# Patient Record
Sex: Male | Born: 1955 | Hispanic: No | Marital: Married | Smoking: Never smoker
Health system: Southern US, Community
[De-identification: ages and names within clinical notes are randomized; demographics above are authoritative.]

## PROBLEM LIST (undated history)

## (undated) DIAGNOSIS — J45909 Unspecified asthma, uncomplicated: Secondary | ICD-10-CM

## (undated) DIAGNOSIS — Z860100 Personal history of colon polyps, unspecified: Secondary | ICD-10-CM

## (undated) DIAGNOSIS — R7303 Prediabetes: Secondary | ICD-10-CM

## (undated) DIAGNOSIS — I10 Essential (primary) hypertension: Secondary | ICD-10-CM

## (undated) DIAGNOSIS — M2550 Pain in unspecified joint: Secondary | ICD-10-CM

## (undated) DIAGNOSIS — J189 Pneumonia, unspecified organism: Secondary | ICD-10-CM

## (undated) DIAGNOSIS — K56609 Unspecified intestinal obstruction, unspecified as to partial versus complete obstruction: Secondary | ICD-10-CM

## (undated) DIAGNOSIS — M1990 Unspecified osteoarthritis, unspecified site: Secondary | ICD-10-CM

## (undated) DIAGNOSIS — I719 Aortic aneurysm of unspecified site, without rupture: Secondary | ICD-10-CM

## (undated) HISTORY — PX: POLYPECTOMY: SHX149

## (undated) HISTORY — PX: COLONOSCOPY: SHX174

## (undated) HISTORY — DX: Essential (primary) hypertension: I10

---

## 2002-06-06 ENCOUNTER — Emergency Department (HOSPITAL_COMMUNITY): Admission: EM | Admit: 2002-06-06 | Discharge: 2002-06-06 | Payer: Self-pay | Admitting: Emergency Medicine

## 2005-06-15 ENCOUNTER — Ambulatory Visit: Payer: Self-pay | Admitting: Gastroenterology

## 2005-06-16 ENCOUNTER — Ambulatory Visit: Payer: Self-pay | Admitting: Gastroenterology

## 2005-06-23 ENCOUNTER — Ambulatory Visit: Payer: Self-pay | Admitting: Gastroenterology

## 2010-10-23 HISTORY — PX: ANKLE SURGERY: SHX546

## 2011-10-24 HISTORY — PX: NOSE SURGERY: SHX723

## 2015-04-28 ENCOUNTER — Ambulatory Visit: Payer: Self-pay | Admitting: Internal Medicine

## 2015-05-07 ENCOUNTER — Ambulatory Visit: Payer: Self-pay | Admitting: Internal Medicine

## 2015-06-30 ENCOUNTER — Ambulatory Visit (INDEPENDENT_AMBULATORY_CARE_PROVIDER_SITE_OTHER): Payer: Commercial Managed Care - HMO | Admitting: Internal Medicine

## 2015-06-30 ENCOUNTER — Encounter: Payer: Self-pay | Admitting: Internal Medicine

## 2015-06-30 ENCOUNTER — Other Ambulatory Visit (INDEPENDENT_AMBULATORY_CARE_PROVIDER_SITE_OTHER): Payer: Commercial Managed Care - HMO

## 2015-06-30 ENCOUNTER — Ambulatory Visit (INDEPENDENT_AMBULATORY_CARE_PROVIDER_SITE_OTHER)
Admission: RE | Admit: 2015-06-30 | Discharge: 2015-06-30 | Disposition: A | Discharge status: Home / Self Care | Payer: Commercial Managed Care - HMO | Source: Ambulatory Visit | Attending: Internal Medicine | Admitting: Internal Medicine

## 2015-06-30 ENCOUNTER — Encounter (INDEPENDENT_AMBULATORY_CARE_PROVIDER_SITE_OTHER): Payer: Self-pay

## 2015-06-30 VITALS — BP 140/98 | HR 65 | Temp 98.7°F | Resp 14 | Ht 72.0 in | Wt 343.0 lb

## 2015-06-30 DIAGNOSIS — R030 Elevated blood-pressure reading, without diagnosis of hypertension: Secondary | ICD-10-CM | POA: Diagnosis not present

## 2015-06-30 DIAGNOSIS — R059 Cough, unspecified: Secondary | ICD-10-CM | POA: Insufficient documentation

## 2015-06-30 DIAGNOSIS — R05 Cough: Secondary | ICD-10-CM | POA: Diagnosis not present

## 2015-06-30 DIAGNOSIS — I10 Essential (primary) hypertension: Secondary | ICD-10-CM | POA: Insufficient documentation

## 2015-06-30 LAB — COMPREHENSIVE METABOLIC PANEL WITH GFR
ALT: 24 U/L (ref 0–53)
AST: 21 U/L (ref 0–37)
Albumin: 3.9 g/dL (ref 3.5–5.2)
Alkaline Phosphatase: 105 U/L (ref 39–117)
BUN: 13 mg/dL (ref 6–23)
CO2: 25 meq/L (ref 19–32)
Calcium: 9 mg/dL (ref 8.4–10.5)
Chloride: 106 meq/L (ref 96–112)
Creatinine, Ser: 0.85 mg/dL (ref 0.40–1.50)
GFR: 118.65 mL/min
Glucose, Bld: 93 mg/dL (ref 70–99)
Potassium: 4 meq/L (ref 3.5–5.1)
Sodium: 138 meq/L (ref 135–145)
Total Bilirubin: 0.4 mg/dL (ref 0.2–1.2)
Total Protein: 7.4 g/dL (ref 6.0–8.3)

## 2015-06-30 LAB — HEMOGLOBIN A1C: Hgb A1c MFr Bld: 6.1 % (ref 4.6–6.5)

## 2015-06-30 LAB — LIPID PANEL
Cholesterol: 143 mg/dL (ref 0–200)
HDL: 35.7 mg/dL — ABNORMAL LOW
LDL Cholesterol: 87 mg/dL (ref 0–99)
NonHDL: 107.1
Total CHOL/HDL Ratio: 4
Triglycerides: 102 mg/dL (ref 0.0–149.0)
VLDL: 20.4 mg/dL (ref 0.0–40.0)

## 2015-06-30 LAB — CBC
HCT: 42.1 % (ref 39.0–52.0)
Hemoglobin: 14.1 g/dL (ref 13.0–17.0)
MCHC: 33.5 g/dL (ref 30.0–36.0)
MCV: 86.6 fl (ref 78.0–100.0)
Platelets: 351 10*3/uL (ref 150.0–400.0)
RBC: 4.86 Mil/uL (ref 4.22–5.81)
RDW: 13.6 % (ref 11.5–15.5)
WBC: 6.7 10*3/uL (ref 4.0–10.5)

## 2015-06-30 NOTE — Assessment & Plan Note (Signed)
Checking for complications with CMP, lipid, HgA1c. Talked with him about the need for exercise to stay healthy. Possibly complicated by hypertension. Denies problem with it in the past but has had weight gain in the last years.

## 2015-06-30 NOTE — Progress Notes (Signed)
Pre visit review using our clinic review tool, if applicable. No additional management support is needed unless otherwise documented below in the visit note. 

## 2015-06-30 NOTE — Progress Notes (Signed)
   Subjective:    Patient ID: Ronald Harding, male    DOB: Dec 27, 1955, 59 y.o.   MRN: [id number]  HPI The patient is a new 59 YO man coming in for cough. Present for about 1 month. Concurrent with some nasal drainage although that comes and goes since an injury with many broken bones and reconstruction on his ankle. 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 His daughter is with him today and helps to provide the history. He denies chest pains, SOB. No sputum with the cough. No fevers or chills. No weight loss. Has very significant smoking history but has been a non-smoker for several years.   PMH, [facility], social history reviewed and updated.   Review of Systems  Constitutional: Negative for fever, activity change, appetite change, fatigue and unexpected weight change.  HENT: Positive for congestion and rhinorrhea. Negative for ear discharge, ear pain, sinus pressure, sore throat and trouble swallowing.   Eyes: Negative.   Respiratory: Positive for cough. Negative for chest tightness, shortness of breath and wheezing.   Cardiovascular: Negative for chest pain, palpitations and leg swelling.  Gastrointestinal: Negative for nausea, abdominal pain, diarrhea, constipation and abdominal distention.  Musculoskeletal: Negative.   Skin: Negative.   Neurological: Negative.   Psychiatric/Behavioral: Negative.       Objective:   Physical Exam  Constitutional: He appears well-developed and well-nourished.  Morbidly obese  HENT:  Head: Normocephalic and atraumatic.  Eyes: EOM are normal.  Neck: Normal range of motion.  Cardiovascular: Normal rate and regular rhythm.   Pulmonary/Chest: Effort normal and breath sounds normal. No respiratory distress. He has no wheezes. He has no rales.  Abdominal: Soft. Bowel sounds are normal. He exhibits no distension. There is no tenderness. There is no rebound.  Musculoskeletal: He exhibits edema.  Some 1+ edema to midshin right leg  Neurological: He is alert.  Coordination normal.  Skin: Skin is warm and dry.  Psychiatric: He has a normal mood and affect.   Filed Vitals:   06/30/15 0932 06/30/15 1009  BP: 152/80 140/98  Pulse: 65   Temp: 98.7 F (37.1 C)   TempSrc: Oral   Resp: 14   Height: 6' (1.829 m)   Weight: 343 lb (155.584 kg)   SpO2: 96%       Assessment & Plan:

## 2015-06-30 NOTE — Patient Instructions (Signed)
We will check the blood work today and call you back with the results.   We are also checking an x-ray of the lungs to make sure that there is nothing causing the cough that we could not hear on exam.  Exercise to Lose Weight Exercise and a healthy diet may help you lose weight. Your doctor may suggest specific exercises. EXERCISE IDEAS AND TIPS  Choose low-cost things you enjoy doing, such as walking, bicycling, or exercising to workout videos.  Take stairs instead of the elevator.  Walk during your lunch break.  Park your car further away from work or school.  Go to a gym or an exercise class.  Start with 5 to 10 minutes of exercise each day. Build up to 30 minutes of exercise 4 to 6 days a week.  Wear shoes with good support and comfortable clothes.  Stretch before and after working out.  Work out until you breathe harder and your heart beats faster.  Drink extra water when you exercise.  Do not do so much that you hurt yourself, feel dizzy, or get very short of breath. Exercises that burn about 150 calories:  Running 1  miles in 15 minutes.  Playing volleyball for 45 to 60 minutes.  Washing and waxing a car for 45 to 60 minutes.  Playing touch football for 45 minutes.  Walking 1  miles in 35 minutes.  Pushing a stroller 1  miles in 30 minutes.  Playing basketball for 30 minutes.  Raking leaves for 30 minutes.  Bicycling 5 miles in 30 minutes.  Walking 2 miles in 30 minutes.  Dancing for 30 minutes.  Shoveling snow for 15 minutes.  Swimming laps for 20 minutes.  Walking up stairs for 15 minutes.  Bicycling 4 miles in 15 minutes.  Gardening for 30 to 45 minutes.  Jumping rope for 15 minutes.  Washing windows or floors for 45 to 60 minutes. Document Released: 11/11/2010 Document Revised: 01/01/2012 Document Reviewed: 11/11/2010 [facility] Patient Information 2015 [place], [place]. This information is not intended to replace advice given to you by  your health care provider. Make sure you discuss any questions you have with your health care provider.

## 2015-06-30 NOTE — Assessment & Plan Note (Signed)
Given past smoking history will check CXR. Likely to be related from nasal drainage from prior nose injury and reconstructive surgery. He does not wish to try flonase or pills.

## 2015-06-30 NOTE — Assessment & Plan Note (Signed)
BP initially moderately elevated, decreased some with recheck. Still elevated and will see back sooner and if still elevated needs treatment. Did not wish to start treatment today.

## 2015-07-01 ENCOUNTER — Other Ambulatory Visit: Payer: Self-pay | Admitting: Internal Medicine

## 2015-07-01 DIAGNOSIS — R9389 Abnormal findings on diagnostic imaging of other specified body structures: Secondary | ICD-10-CM

## 2015-07-19 ENCOUNTER — Telehealth: Payer: Self-pay | Admitting: Internal Medicine

## 2015-07-19 ENCOUNTER — Ambulatory Visit (INDEPENDENT_AMBULATORY_CARE_PROVIDER_SITE_OTHER)
Admission: RE | Admit: 2015-07-19 | Discharge: 2015-07-19 | Disposition: A | Discharge status: Home / Self Care | Payer: Commercial Managed Care - HMO | Source: Ambulatory Visit | Attending: Internal Medicine | Admitting: Internal Medicine

## 2015-07-19 DIAGNOSIS — R9389 Abnormal findings on diagnostic imaging of other specified body structures: Secondary | ICD-10-CM

## 2015-07-19 DIAGNOSIS — R938 Abnormal findings on diagnostic imaging of other specified body structures: Secondary | ICD-10-CM | POA: Diagnosis not present

## 2015-07-19 MED ORDER — IOHEXOL 350 MG/ML SOLN
100.0000 mL | Freq: Once | INTRAVENOUS | Status: AC | PRN
  Administered 2015-07-19: 100 mL via INTRAVENOUS

## 2015-07-19 NOTE — Telephone Encounter (Signed)
Rec'd from [facility] forward 14 pages to Dr.Kollar

## 2015-08-16 ENCOUNTER — Telehealth: Payer: Self-pay | Admitting: Internal Medicine

## 2015-08-16 NOTE — Telephone Encounter (Signed)
Is requesting [employer] referral to Dr. Luciana Axeankin for Thursday 10/27 at 1:00pm  - not sure what patient is being seen for.

## 2015-08-19 NOTE — Telephone Encounter (Signed)
Ronald GravenHumana Berkley Ronald Harding #[id number] valid 08/16/2015 - 02/12/2016 for 6 visits

## 2015-08-20 ENCOUNTER — Ambulatory Visit (INDEPENDENT_AMBULATORY_CARE_PROVIDER_SITE_OTHER): Payer: Commercial Managed Care - HMO | Admitting: Internal Medicine

## 2015-08-20 ENCOUNTER — Encounter: Payer: Self-pay | Admitting: Internal Medicine

## 2015-08-20 VITALS — BP 144/84 | HR 70 | Temp 98.3°F | Ht 72.0 in | Wt 345.2 lb

## 2015-08-20 DIAGNOSIS — I712 Thoracic aortic aneurysm, without rupture, unspecified: Secondary | ICD-10-CM | POA: Insufficient documentation

## 2015-08-20 DIAGNOSIS — K029 Dental caries, unspecified: Secondary | ICD-10-CM

## 2015-08-20 DIAGNOSIS — I7121 Aneurysm of the ascending aorta, without rupture: Secondary | ICD-10-CM | POA: Insufficient documentation

## 2015-08-20 DIAGNOSIS — R030 Elevated blood-pressure reading, without diagnosis of hypertension: Secondary | ICD-10-CM

## 2015-08-20 MED ORDER — LOSARTAN POTASSIUM-HCTZ 50-12.5 MG PO TABS: 1.0000 | ORAL_TABLET | Freq: Every day | ORAL | Status: DC

## 2015-08-20 NOTE — Progress Notes (Signed)
   Subjective:    Patient ID: Ronald Harding, male    DOB: 08/08/1956, 59 y.o.   MRN: [id number]  HPI  He continues to have right-sided swelling which he feels involves the abdomen as well as the right lower extremity.  He's been followed by the Ophthalmologist for vascular swelling in the right eye. Apparently a retinal vascular injection was recommended because of risk of blindness.  His blood pressure have ranged from 140/80-152/98. He has no associated cardiopulmonary symptoms. The cough for which he was originally evaluated has resolved  A chest x-ray done to evaluate the cough revealed possible ectasia versus aneurysm of the ascending aorta. CAT scan does confirm a 4.6 cm aneurysm without rupture.  He has a PMH of major dental and CNS injuries from a fall remotely.  A1c was 6.1% in 9/16.  Review of Systems  There is no significant cough, sputum production,hemoptysis, wheezing,or  paroxysmal nocturnal dyspnea @ present. Unexplained weight loss, abdominal pain, significant dyspepsia, dysphagia, melena, rectal bleeding, or persistently small caliber stools are not present. Dysuria, pyuria, hematuria, frequency, nocturia or polyuria are denied.     Objective:   Physical Exam Pertinent or positive findings include: He has a beard and mustache. Pattern alopecia is present. He has severe diffuse caries with erosions to and below the gumline. Frank necrosis is present. Abdomen is massive. He has trace edema on the left and trace-one half on the right. Homans sign is negative. There is crepitus of the knees.  General appearance :adequately nourished; in no distress.  Eyes: No conjunctival inflammation or scleral icterus is present.  Oral exam:  Lips and gums are healthy appearing.There is no oropharyngeal erythema or exudate noted.   Heart:  Normal rate and regular rhythm. S1 and S2 normal without gallop, murmur, click, rub or other extra sounds    Lungs:Chest clear to auscultation;  no wheezes, rhonchi,rales ,or rubs present.No increased work of breathing.   Abdomen: bowel sounds normal, soft and non-tender without masses, organomegaly or hernias noted.  No guarding or rebound.   Vascular : all pulses equal ; no bruits present.  Skin:Warm & dry.  Intact without suspicious lesions or rashes ; no tenting .   Lymphatic: No lymphadenopathy is noted about the head, neck, axilla.   Neuro: Strength, tone & DTRs normal.      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

## 2015-08-20 NOTE — Progress Notes (Signed)
Pre visit review using our clinic review tool, if applicable. No additional management support is needed unless otherwise documented below in the visit note. 

## 2015-08-20 NOTE — Patient Instructions (Addendum)
Caries ( cavities) and plaque on the teeth can lead to significant local and systemic infections with threat to your health.Please have dental care completed as soon as possible.  The following nutritional changes may help prevent Diabetes progression & complications.  White carbohydrates (potatoes, rice, bread, and pasta) cause a high spike of the sugar level which stays elevated for a significant period of time (called sugar"load").  For example a  baked potato has a cup of sugar and a  french fry  2 teaspoons of sugar.  More complex carbs such as yams, wild  rice, whole grained bread &  wheat pasta have been much lower spike and persistent load of sugar than the white carbs. The pancreas excretes excess insulin in response to the high spike & load of sugar . Over time the pancreas can actually run out of insulin necessitating insulin shots.  Minimal Blood Pressure Goal= AVERAGE < 140/90;  Ideal is an AVERAGE < 135/85. This AVERAGE should be calculated from @ least 5-7 BP readings taken @ different times of day on different days of week. You should not respond to isolated BP readings , but rather the AVERAGE for that week .Please bring your  blood pressure cuff to office visits to verify that it is reliable.It  can also be checked against the blood pressure device at the pharmacy. Finger or wrist cuffs are not dependable; an arm cuff is.

## 2015-08-31 ENCOUNTER — Telehealth: Payer: Self-pay | Admitting: Internal Medicine

## 2015-08-31 NOTE — Telephone Encounter (Signed)
Patient is requesting refund on No Show Fee from 7/6.  He states he did not make this appointment.

## 2015-09-01 ENCOUNTER — Encounter: Payer: Commercial Managed Care - HMO | Admitting: Cardiothoracic Surgery

## 2015-09-06 ENCOUNTER — Institutional Professional Consult (permissible substitution) (INDEPENDENT_AMBULATORY_CARE_PROVIDER_SITE_OTHER): Payer: Commercial Managed Care - HMO | Admitting: Cardiothoracic Surgery

## 2015-09-06 ENCOUNTER — Encounter: Payer: Self-pay | Admitting: Cardiothoracic Surgery

## 2015-09-06 VITALS — BP 148/90 | HR 88 | Resp 20 | Ht 72.0 in | Wt 342.0 lb

## 2015-09-06 DIAGNOSIS — R590 Localized enlarged lymph nodes: Secondary | ICD-10-CM

## 2015-09-06 DIAGNOSIS — I712 Thoracic aortic aneurysm, without rupture: Secondary | ICD-10-CM

## 2015-09-06 DIAGNOSIS — I7121 Aneurysm of the ascending aorta, without rupture: Secondary | ICD-10-CM

## 2015-09-06 DIAGNOSIS — R599 Enlarged lymph nodes, unspecified: Secondary | ICD-10-CM

## 2015-09-06 NOTE — Progress Notes (Signed)
PCP is Myrlene Broker, MD Referring Provider is Pecola Lawless, MD  Chief Complaint  Patient presents with  . Thoracic Aortic Aneurysm    Surgical eval, CTA Chest 07/19/15  4.6 cm fusiform ascending aortic aneurysm 2 cm anterior mediastinal lymph node  Patient examined, CTA of thoracic aorta and chest personally reviewed  HPI:   The patient is a  59 year old AA is morbidly obese male with recent onset of hypertension. He has had a chronic nonproductive cough this past fall. CT scan of the chest was performed by his primary physician. This shows a 4.6 cm fusiform ascending aortic dilatation without evidence of ulceration or hematoma. It is asymptomatic. The lung fields are clear of pulmonary nodules or mass. There is an anterior mediastinal lymph node measuring 2 cm anterior to the aortic arch at the level V lymph node station. The patient denies hemoptysis, night sweats, fever, or weight loss.  The patient states he had asthma as a child but has never been a smoker. He denies history of recurrent pneumonia. The patient has had problems with chronic sinus inflammation-infection following a fall in 2014 the roof onto his face and left shoulder. He had facial fractures, broken teeth, left shoulder dislocation, and a prolonged abdominal ileus. He did not require tracheostomy for intubation.  The patient denies family history of thoracic aortic or abdominal aortic aneurysm disease or aortic dissection. The patient has not had an echocardiogram and denies any cardiac history.  No past medical history on file.  Past Surgical History  Procedure Laterality Date  . Nose surgery  2013  . Ankle surgery Right 2012    Family History  Problem Relation Age of Onset  . Hypertension Mother   . Arthritis Mother   . Hypertension Father   . Heart disease Father   . Arthritis Father     Social History Social History  Substance Use Topics  . Smoking status: Former Games developer  . Smokeless  tobacco: None  . Alcohol Use: No    Current Outpatient Prescriptions  Medication Sig Dispense Refill  . losartan-hydrochlorothiazide (HYZAAR) 50-12.5 MG tablet Take 1 tablet by mouth daily. 30 tablet 5   No current facility-administered medications for this visit.    Allergies  Allergen Reactions  . Betadine [Povidone Iodine] Hives    Pt states in 2012 in [place] he fell and broke several bones. Betadine broke him out.     Review of Systems \       Review of Systems :  [ y ] = yes, [  ] = no        General :  Weight gain [ yes  ]    Weight loss  [   ]  Fatigue [  ]  Fever [  ]  Chills  [  ]                                Weakness  [  ]           Cardiac :  Chest pain/ pressure [  ]  Resting SOB [  ] exertional SOB [  ]                        Orthopnea [  ]  Pedal edema  [  ]  Palpitations [  ] Syncope/presyncope   Paroxysmal nocturnal dyspnea [  ]        Pulmonary : cough Mahler.Beck[yes  ]  wheezing [  ]  Hemoptysis [  ] Sputum [  ] Snoring [ yes ]                              Pneumothorax [  ]  Sleep apnea [  ]       GI : Vomiting [  ]  Dysphagia [  ]  Melena  [  ]  Abdominal pain [  ] BRBPR [  ]              Heart burn [  ]  Constipation [  ] Diarrhea  [  ] Colonoscopy [ yes colonoscopy performed and was negative ]       GU : Hematuria [  ]  Dysuria [  ]  Nocturia [  ] UTI's [  ]       Vascular : Claudication [  ]  Rest pain [  ]  DVT [  ] Vein stripping [  ] leg ulcers [  ]                          TIA [  ] Stroke [  ]  Varicose veins [  ]       NEURO :  Headaches  [  ] Seizures [  ] Vision changes [  ] Paresthesias [  ]       Musculoskeletal :  Arthritis [ yes ] Gout  [  ]  Back pain [  ]  Joint pain [ yes ]the patient fractured his right ankle in the fall to the roof in 2014 and had pins. He has chronic swelling and soreness in the ankle.       Skin :  Rash [  ]  Melanoma [  ]        Heme : Bleeding problems [  ]Clotting Disorders [  ] Anemia [  ]Blood  Transfusion [ ]        Endocrine : Diabetes [no  ] Thyroid Disorder  [  ]       Psych : Depression [  ]  Anxiety [  ]  Psych hospitalizations [  ]                                              BP 148/90 mmHg  Pulse 88  Resp 20  Ht 6' (1.829 m)  Wt 342 lb (155.13 kg)  BMI 46.37 kg/m2  SpO2 97% Physical Exam      Physical Exam  General: pleasant but morbidly obese AA male accompanied by wife and daughter no distress HEENT: Normocephalic pupils equal , dentition Por[place] so broken necrotic teeth Neck: Supple without JVD, adenopathy, or bruit Chest: Clear to auscultation, symmetrical breath sounds, no rhonchi, no tenderness             or deformity Cardiovascular: Regular rate and rhythm, no murmur, no gallop, peripheral pulses             palpable in all extremities Abdomen:  Soft, obese,nontender, no palpable mass or organomegaly Extremities: Warm, well-perfused, no clubbing cyanosis  Right lower extremity ankle with chronic edema and changes of venous stasis             Rectal/GU: Deferred Neuro: Grossly non--focal and symmetrical throughout Skin: Clean and dry without rash or ulceration   Diagnostic Tests: CT scan personally reviewed and results counseled with patient and family.  Impression: Mild-moderate fusiform dilatation of the ascending aorta Currently he is at low risk for aortic dissection. With adequate blood pressure control and some weight loss it should not pose a significant problem but will need to be followed with serial CT scans. Surgery would not be recommended until the diameter exceeds 5.5 cm.  2 cm anterior mediastinal lymph node which appears inflammatory or reactionary but will need to be followed  Plan:repeat CT scan in 6-8 months. I recommended the patient keep  record of his blood pressure measurements at home.   d[facility] consultation [facility] for  multiple necrotic teeth which will  need to be removed prior to any  cardiac surgery or thoracic aneurysm repair.  Return in 6 months with CTA of the thoracic aorta to review fusiform aneurysm and anterior mediastinal adenopathy Mikey Bussing, MD [facility] [phone]

## 2015-09-09 ENCOUNTER — Other Ambulatory Visit (HOSPITAL_COMMUNITY): Payer: Self-pay | Admitting: Dentistry

## 2015-09-09 ENCOUNTER — Ambulatory Visit (HOSPITAL_COMMUNITY): Payer: Self-pay | Admitting: Dentistry

## 2015-09-09 ENCOUNTER — Telehealth: Payer: Self-pay | Admitting: Internal Medicine

## 2015-09-09 ENCOUNTER — Encounter (HOSPITAL_COMMUNITY): Payer: Self-pay | Admitting: Dentistry

## 2015-09-09 VITALS — BP 125/72 | HR 68 | Temp 98.4°F

## 2015-09-09 DIAGNOSIS — K030 Excessive attrition of teeth: Secondary | ICD-10-CM

## 2015-09-09 DIAGNOSIS — K036 Deposits [accretions] on teeth: Secondary | ICD-10-CM

## 2015-09-09 DIAGNOSIS — K045 Chronic apical periodontitis: Secondary | ICD-10-CM | POA: Insufficient documentation

## 2015-09-09 DIAGNOSIS — M264 Malocclusion, unspecified: Secondary | ICD-10-CM

## 2015-09-09 DIAGNOSIS — IMO0002 Reserved for concepts with insufficient information to code with codable children: Secondary | ICD-10-CM

## 2015-09-09 DIAGNOSIS — K060 Gingival recession: Secondary | ICD-10-CM

## 2015-09-09 DIAGNOSIS — I7121 Aneurysm of the ascending aorta, without rupture: Secondary | ICD-10-CM

## 2015-09-09 DIAGNOSIS — K0401 Reversible pulpitis: Secondary | ICD-10-CM

## 2015-09-09 DIAGNOSIS — K0530 Chronic periodontitis, unspecified: Secondary | ICD-10-CM

## 2015-09-09 DIAGNOSIS — K083 Retained dental root: Secondary | ICD-10-CM | POA: Insufficient documentation

## 2015-09-09 DIAGNOSIS — M278 Other specified diseases of jaws: Secondary | ICD-10-CM

## 2015-09-09 DIAGNOSIS — K029 Dental caries, unspecified: Secondary | ICD-10-CM

## 2015-09-09 DIAGNOSIS — Z01818 Encounter for other preprocedural examination: Secondary | ICD-10-CM

## 2015-09-09 DIAGNOSIS — M270 Developmental disorders of jaws: Secondary | ICD-10-CM

## 2015-09-09 DIAGNOSIS — K08409 Partial loss of teeth, unspecified cause, unspecified class: Secondary | ICD-10-CM

## 2015-09-09 DIAGNOSIS — K001 Supernumerary teeth: Secondary | ICD-10-CM

## 2015-09-09 DIAGNOSIS — I712 Thoracic aortic aneurysm, without rupture: Secondary | ICD-10-CM

## 2015-09-09 NOTE — Progress Notes (Signed)
DENTAL CONSULTATION  Date of Consultation:  09/09/2015 Patient Name:   Ronald Harding Date of Birth:   [birth year] Medical Record Number: [id number]  VITALS: BP 125/72 mmHg  Pulse 68  Temp(Src) 98.4 F (36.9 C) (Oral)  CHIEF COMPLAINT: The patient was referred by Dr. Kathlee Nations Trigt for a pre-surgical dental consultation.   HPI: Ronald Harding is a 59 year old male with history of a thoracic ascending aortic aneurysm. The patient with anticipated repair of the thoracic ascending aortic aneurysm with Dr. Kathlee Nations Trigt.  Patient is now seen as part of a medically necessary pre-surgical dental evaluation to rule out dental infection that may affect the patient's systemic health and anticipated surgery.  The patient has a history of generalized tooth pain involving multiple areas of the mouth. Patient describes pain as being both sharp and dull/achy in nature. Pain has reached an intensity of 8 out of 10 but is currently 3 out of 10 today. This pain has been occurring over the past 2-3 years. The patient last saw a dentist in 2012 for a dental examination and administration of antibiotics for dental abscess. This was with a dentist in [place], [place]. Patient denies having any partial dentures. Patient does not seek regular dental care.  PROBLEM LIST: Patient Active Problem List   Diagnosis Date Noted  . Thoracic ascending aortic aneurysm (HCC) 08/20/2015    Priority: High  . Chronic apical periodontitis 09/09/2015  . Retained dental roots 09/09/2015  . Dental caries 09/09/2015  . 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 Chronic periodontitis 09/09/2015  . Dental caries extending into pulp 08/20/2015  . Elevated blood pressure (not hypertension) 06/30/2015  . Morbid obesity (HCC) 06/30/2015  . Cough 06/30/2015    PMH: Past Medical History  Diagnosis Date  . Hypertension     PSH: Past Surgical History  Procedure Laterality Date  . Nose surgery  2013  . Ankle surgery Right  2012    ALLERGIES: Allergies  Allergen Reactions  . Betadine [Povidone Iodine] Hives    Pt states in 2012 in [place] he fell and broke several bones. Betadine broke him out.     MEDICATIONS: Current Outpatient Prescriptions  Medication Sig Dispense Refill  . aspirin 81 MG tablet Take 81 mg by mouth daily.    Marland Kitchen losartan-hydrochlorothiazide (HYZAAR) 50-12.5 MG tablet Take 1 tablet by mouth daily. 30 tablet 5   No current facility-administered medications for this visit.    LABS: Lab Results  Component Value Date   WBC 6.7 06/30/2015   HGB 14.1 06/30/2015   HCT 42.1 06/30/2015   MCV 86.6 06/30/2015   PLT 351.0 06/30/2015      Component Value Date/Time   NA 138 06/30/2015 1025   K 4.0 06/30/2015 1025   CL 106 06/30/2015 1025   CO2 25 06/30/2015 1025   GLUCOSE 93 06/30/2015 1025   BUN 13 06/30/2015 1025   CREATININE 0.85 06/30/2015 1025   CALCIUM 9.0 06/30/2015 1025   No results found for: INR, PROTIME No results found for: PTT  SOCIAL HISTORY: Social History   Social History  . Marital Status: Married    Spouse Name: N/A  . Number of Children: 2  . Years of Education: N/A   Occupational History  . Not on file.   Social History Main Topics  . Smoking status: Former Games developer  . Smokeless tobacco: Current User     Comment: one pack of chew per week  . Alcohol Use: No  . Drug Use:  No  . Sexual Activity: Not on file   Other Topics Concern  . Not on file   Social History Narrative   Married. 2 children. Disabled.    FAMILY HISTORY: Family History  Problem Relation Age of Onset  . Hypertension Mother   . Arthritis Mother   . Hypertension Father   . Heart disease Father   . Arthritis Father     REVIEW OF SYSTEMS: Reviewed with the patient from Dr. Kathlee Nations Trigt's note. Review of Systems : [ y ] = yes,  = no  General : Weight gain [ yes ] Weight loss  Fatigue  Fever  Chills Weakness   Cardiac : Chest  pain/ pressure  Resting SOB  exertional SOB  Orthopnea  Pedal edema  Palpitations  Syncope/presyncope  Paroxysmal nocturnal dyspnea  Pulmonary : cough Mahler.Beck ] wheezing  Hemoptysis  Sputum  Snoring [ yes ] Pneumothorax  Sleep apnea  GI : Vomiting  Dysphagia  Melena  Abdominal pain  BRBPR  Heart burn  Constipation  Diarrhea  Colonoscopy [ yes colonoscopy performed and was negative ] GU : Hematuria  Dysuria  Nocturia  UTI's  Vascular : Claudication  Rest pain  DVT  Vein stripping  leg ulcers  TIA  Stroke  Varicose veins  NEURO : Headaches  Seizures  Vision changes  Paresthesias  Musculoskeletal : Arthritis [ yes ] Gout  Back pain  Joint pain [ yes ]the patient fractured his right ankle in the fall to the roof in 2014 and had pins. He has chronic swelling and                                                 soreness in the ankle. Skin : Rash  Melanoma   Heme : Bleeding problems Clotting Disorders  Anemia Blood Transfusion  Endocrine : Diabetes [no ] Thyroid Disorder  Psych : Depression  Anxiety  Psych hospitalizations   DENTAL HISTORY: CHIEF COMPLAINT: The patient was referred by Dr. Kathlee Nations Trigt for a pre-surgical dental consultation.   HPI: Ronald Harding is a 59 year old male with history of a thoracic ascending aortic aneurysm. The patient with anticipated repair of the thoracic ascending aortic aneurysm with Dr. Kathlee Nations Trigt.  Patient is now seen as part of a medically necessary pre-surgical dental evaluation to rule out dental infection that may affect the patient's systemic health and anticipated surgery.  The patient has a history of generalized tooth pain involving multiple areas  of the mouth. Patient describes pain as being both sharp and dull/achy in nature. Pain has reached an intensity of 8 out of 10 but is currently 3 out of 10 today. This pain has been occurring over the past 2-3 years. The patient last saw a dentist in 2012 for a dental examination and administration of antibiotics for dental abscess. This was with a dentist in [place], [place]. Patient denies having any partial dentures. Patient does not seek regular dental care.  DENTAL EXAMINATION: GENERAL: The patient is a well-developed,  obese male in no acute distress. HEAD AND NECK: There is no palpable submandibular lymphadenopathy. The patient denies acute TMJ symptoms. INTRAORAL EXAM: The patient has normal saliva. There is no evidence of oral abscess formation. The patient has a fistulous tracts involving the buccal aspect in the area of tooth numbers 30 and 32. The patient has bilateral mandibular lingual tori. Patient has bilateral mandibular lingual lateral exostoses as well. 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 This is consistent with chronic use of chew in this area. DENTITION: The patient is missing tooth #13 and tooth #29. The patient has an impacted supernumerary tooth near the maxillary left canine. This is charted as tooth #61. PERIODONTAL: The patient has chronic periodontitis with plaque and calculus accumulations, gingival recession, moderate to severe horizontal vertical bone loss. DENTAL CARIES/SUBOPTIMAL RESTORATIONS: Patient has rampant dental caries involving multiple teeth as per dental charting form. ENDODONTIC: The patient has a history of acute pulpitis symptoms. Patient has multiple areas of periapical pathology and radiolucency involving tooth numbers 4, 5, 8, 9 12,14,17,18, 19, 20, 21, 28, 30, 31, and 32. CROWN AND BRIDGE: There are no crown or bridge restorations. PROSTHODONTIC: Patient denies having any partial  dentures. OCCLUSION: Patient has a poor occlusal scheme secondary to multiple missing teeth, multiple retained root segments, and lack of replacement of the missing teeth with clinically acceptable dental prostheses.  RADIOGRAPHIC INTERPRETATION: An orthopantogram was taken and supplemented with 14 periapical radiographs. There are multiple missing teeth. There are multiple retained root segments. There are rampant dental caries noted. There are multiple areas of periapical pathology and radiolucency. There is supra-eruption and drifting of the unopposed teeth into the edentulous areas. There is evidence of incisal attrition. There is moderate to severe bone loss. There are mandibular radio opacities consistent with bilateral mandibular lingual tori and lateral exostoses. The maxillary sinuses are noted and appear to be somewhat cloudy.  ASSESSMENTS: 1. Thoracic ascending aortic aneurysm with anticipated surgical repair 2. Presurgical dental consultation 3. History of acute pulpitis 4. Chronic apical periodontitis 5. Multiple retained root segments 6. Rampant dental caries 7. Incisal attrition 8. Chronic periodontitis of bone loss 9. Gingival recession 10. Accretions 11. Multiple missing teeth 12. Bilateral mandibular lingual tori 13. Bilateral mandibular lingual lateral exostoses 14. Supra-eruption and drifting of the unopposed teeth into the edentulous areas 15. Poor occlusal scheme and malocclusion 16. 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 I discussed the risks, benefits, and complications of various treatment options with the patient in relationship to his medical and dental conditions, anticipated thoracic ascending aortic aneurysm repair, and risk for infection. We discussed various treatment options to include no treatment,  multiple extractions with alveoloplasty, pre-prosthetic surgery as indicated, periodontal therapy, dental restorations, root canal therapy, crown and bridge therapy, implant therapy, and replacement of missing teeth as indicated. The patient currently wishes to proceed with extraction of remaining teeth with alveoloplasty and pre-prosthetic surgery as indicated in the operating room with general anesthesia. This has been scheduled for 09/23/2015 at 7:30 AM [facility].  The patient will then follow-up with a dentist of his choice for fabrication of upper and lower complete dentures after adequate healing. The patient is aware of the risk for complications to include bleeding, bruising, swelling, infection, nerve damage, soft tissue damage, root tip fracture, mandible fracture, sinus involvement, and risk  for complications due to the use of general anesthesia. Patient also is aware of the potential other complications not mentioned above. Patient understands the risks and provided informed consent for surgical procedures - today.   2. Discussion of findings with medical team and coordination of future medical and dental care as needed.  I spent in excess of 120 minutes during the conduct of this consultation and >50% of this time involved direct face-to-face encounter for counseling and/or coordination of the patient's care.    Charlynne Pander, DDS

## 2015-09-09 NOTE — Patient Instructions (Addendum)
The patient was scheduled for operating procedure for 09/23/2015 at 7:[facility] AM [facility]. Presurgical testing will contact the patient for appropriate lab testing and radiographs as indicated. The patient was reminded to be nothing by mouth after midnight on the day of the surgery. Dr. Kristin BruinsKulinski       [facility]    Department of Dental Medicine     DR. Luisangel Wainright      HEART VALVES AND MOUTH CARE:  FACTS:   If you have any infection in your mouth, it can infect your heart valve.  If you heart valve is infected, you will be seriously ill.  Infections in the mouth can be SILENT and do not always cause pain.  Examples of infections in the mouth are gum disease, dental cavities, and abscesses.  Some possible signs of infection are: Bad breath, bleeding gums, or teeth that are sensitive to sweets, hot, and/or cold. There are many other signs as well.  WHAT YOU HAVE TO DO:   Brush your teeth after meals and at bedtime. Spend at least 2 minutes brushing well, especially behind your back teeth and all around your teeth that stand alone. Brush at the gumline also.  Do not go to bed without brushing your teeth and flossing.  If you gums bleed when you brush or floss, do NOT stop brushing or flossing. It usually means that your gums need more attention and better cleaning.   If your Dentist or Dr. Kristin BruinsKulinski gave you a prescription mouthwash to use, make sure to use it as directed. If you run out of the medication, get a refill at the pharmacy.   If you were given any other medications or directions by your Dentist, please follow them. If you did not understand the directions or forget what you were told, please call. We will be happy to refresh her memory.  If you need antibiotics before dental procedures, make sure you take them one hour prior to every dental visit as directed.   Get a dental checkup every 4-6 months in order to keep your mouth healthy, or to find and treat any  new infection. You will most likely need your teeth cleaned or gums treated at the same time.  If you are not able to come in for your scheduled appointment, call your Dentist as soon as possible to reschedule.  If you have a problem in between dental visits, call your Dentist.

## 2015-09-09 NOTE — Telephone Encounter (Signed)
Patient is requesting [employer] referral to Dr. Cindra Evesonald [facility]ulinski [facility] [phone] for dental surgery to remove all his teeth.  He has appointment scheduled for 09/23/15.

## 2015-09-14 NOTE — Telephone Encounter (Signed)
[employer] auth submitted. Awaiting approval. °

## 2015-09-17 ENCOUNTER — Encounter (HOSPITAL_COMMUNITY): Payer: Self-pay

## 2015-09-17 ENCOUNTER — Encounter (HOSPITAL_COMMUNITY)
Admission: RE | Admit: 2015-09-17 | Discharge: 2015-09-17 | Disposition: A | Discharge status: Home / Self Care | Payer: Commercial Managed Care - HMO | Source: Ambulatory Visit | Attending: Dentistry | Admitting: Dentistry

## 2015-09-17 DIAGNOSIS — K029 Dental caries, unspecified: Secondary | ICD-10-CM | POA: Insufficient documentation

## 2015-09-17 DIAGNOSIS — Z01812 Encounter for preprocedural laboratory examination: Secondary | ICD-10-CM | POA: Insufficient documentation

## 2015-09-17 DIAGNOSIS — K045 Chronic apical periodontitis: Secondary | ICD-10-CM | POA: Diagnosis not present

## 2015-09-17 DIAGNOSIS — K0401 Reversible pulpitis: Secondary | ICD-10-CM | POA: Diagnosis not present

## 2015-09-17 DIAGNOSIS — I4510 Unspecified right bundle-branch block: Secondary | ICD-10-CM | POA: Diagnosis not present

## 2015-09-17 DIAGNOSIS — Z01818 Encounter for other preprocedural examination: Secondary | ICD-10-CM | POA: Diagnosis not present

## 2015-09-17 DIAGNOSIS — K083 Retained dental root: Secondary | ICD-10-CM | POA: Insufficient documentation

## 2015-09-17 HISTORY — DX: Pain in unspecified joint: M25.50

## 2015-09-17 HISTORY — DX: Unspecified asthma, uncomplicated: J45.909

## 2015-09-17 HISTORY — DX: Pneumonia, unspecified organism: J18.9

## 2015-09-17 HISTORY — DX: Personal history of colon polyps, unspecified: Z86.0100

## 2015-09-17 HISTORY — DX: Aortic aneurysm of unspecified site, without rupture: I71.9

## 2015-09-17 HISTORY — DX: Personal history of colonic polyps: Z86.010

## 2015-09-17 LAB — BASIC METABOLIC PANEL WITH GFR
Anion gap: 9 (ref 5–15)
BUN: 15 mg/dL (ref 6–20)
CO2: 21 mmol/L — ABNORMAL LOW (ref 22–32)
Calcium: 8.8 mg/dL — ABNORMAL LOW (ref 8.9–10.3)
Chloride: 105 mmol/L (ref 101–111)
Creatinine, Ser: 0.97 mg/dL (ref 0.61–1.24)
GFR calc Af Amer: >60 mL/min
GFR calc non Af Amer: >60 mL/min
Glucose, Bld: 96 mg/dL (ref 65–99)
Potassium: 4 mmol/L (ref 3.5–5.1)
Sodium: 135 mmol/L (ref 135–145)

## 2015-09-17 LAB — CBC
HCT: 41.5 % (ref 39.0–52.0)
Hemoglobin: 13.9 g/dL (ref 13.0–17.0)
MCH: 29.4 pg (ref 26.0–34.0)
MCHC: 33.5 g/dL (ref 30.0–36.0)
MCV: 87.9 fL (ref 78.0–100.0)
Platelets: 347 10*3/uL (ref 150–400)
RBC: 4.72 MIL/uL (ref 4.22–5.81)
RDW: 13.5 % (ref 11.5–15.5)
WBC: 10 10*3/uL (ref 4.0–10.5)

## 2015-09-17 NOTE — Progress Notes (Signed)
Denies having a cardiologist  Medical Md is Dr.Elizabeth Okey DupreCrawford  Stress test done through workers comp in 2013 as settlement-done at [facility]  Denies ever having an echo or heart cath  CXR in epic from 06-30-15  Denies EKG in past yr

## 2015-09-17 NOTE — Progress Notes (Addendum)
   09/17/15 1306  OBSTRUCTIVE SLEEP APNEA  Have you ever been diagnosed with sleep apnea through a sleep study? No (sleep study done around 2012--not confirmed to have sleep apnea)  Do you snore loudly (loud enough to be heard through closed doors)?  1  Do you often feel tired, fatigued, or sleepy during the daytime (such as falling asleep during driving or talking to someone)? 0  Has anyone observed you stop breathing during your sleep? 0  Do you have, or are you being treated for high blood pressure? 1  BMI more than 35 kg/m2? 1  Age > 50 (1-yes) 1  Neck circumference greater than:Male 16 inches or larger, Male 17inches or larger? 1 62(18)  Male Gender (Yes=1) 1  Obstructive Sleep Apnea Score 6  Score 5 or greater  Results sent to PCP

## 2015-09-17 NOTE — Pre-Procedure Instructions (Signed)
Ramond R Zollinger  09/17/2015      [facility] [id number] [place], [place] - [address] [facility] [name] [place] [id number] Phone: [phone] Fax: [phone]    Your procedure is scheduled on Thurs, Dec 1 @ 7:30 AM  [facility] at 5:30 AM  Call this number if you have problems the morning of surgery:  [phone]   Remember:  Do not eat food or drink liquids after midnight.               No Goody's,BC's,Aleve,Aspirin,Ibuprofen,Advil,Motrin,Fish Oil,or any Herbal Medications.    Do not wear jewelry.  Do not wear lotions, powders, or colognes.  You may wear deodorant.             Men may shave face and neck.  Do not bring valuables to the hospital.  [facility] is not responsible for any belongings or valuables.  Contacts, dentures or bridgework may not be worn into surgery.  Leave your suitcase in the car.  After surgery it may be brought to your room.  For patients admitted to the hospital, discharge time will be determined by your treatment team.  Patients discharged the day of surgery will not be allowed to drive home.    Special instructions:  [facility] - Preparing for Surgery  Before surgery, you can play an important role.  Because skin is not sterile, your skin needs to be as free of germs as possible.  You can reduce the number of germs on you skin by washing with CHG (chlorahexidine gluconate) soap before surgery.  CHG is an antiseptic cleaner which kills germs and bonds with the skin to continue killing germs even after washing.  Please DO NOT use if you have an allergy to CHG or antibacterial soaps.  If your skin becomes reddened/irritated stop using the CHG and inform your nurse when you arrive at Short Stay.  Do not shave (including legs and underarms) for at least 48 hours prior to the first CHG shower.  You may shave your face.  Please follow these instructions  carefully:   1.  Shower with CHG Soap the night before surgery and the                                morning of Surgery.  2.  If you choose to wash your hair, wash your hair first as usual with your       normal shampoo.  3.  After you shampoo, rinse your hair and body thoroughly to remove the                      Shampoo.  4.  Use CHG as you would any other liquid soap.  You can apply chg directly       to the skin and wash gently with scrungie or a clean washcloth.  5.  Apply the CHG Soap to your body ONLY FROM THE NECK DOWN.        Do not use on open wounds or open sores.  Avoid contact with your eyes,       ears, mouth and genitals (private parts).  Wash genitals (private parts)       with your normal soap.  6.  Wash thoroughly, paying special attention to the area  where your surgery        will be performed.  7.  Thoroughly rinse your body with warm water from the neck down.  8.  DO NOT shower/wash with your normal soap after using and rinsing off       the CHG Soap.  9.  Pat yourself dry with a clean towel.            10.  Wear clean pajamas.            11.  Place clean sheets on your bed the night of your first shower and do not        sleep with pets.  Day of Surgery  Do not apply any lotions/deoderants the morning of surgery.  Please wear clean clothes to the hospital/surgery center.    Please read over the following fact sheets that you were given. Pain Booklet, Coughing and Deep Breathing and Surgical Site Infection Prevention

## 2015-09-20 NOTE — Progress Notes (Signed)
Re- requested stress test.

## 2015-09-22 MED ORDER — DEXTROSE 5 % IV SOLN
3.0000 g | INTRAVENOUS | Status: AC
  Administered 2015-09-23: 3 g via INTRAVENOUS
  Filled 2015-09-22: qty 3000

## 2015-09-22 NOTE — Progress Notes (Signed)
Patient states he [facility] at [facility]  [facility] STATED THEY DO NOT SEE THAT PATIENT HAD A STRESS TEST.

## 2015-09-23 ENCOUNTER — Ambulatory Visit (HOSPITAL_COMMUNITY): Payer: Commercial Managed Care - HMO | Admitting: Certified Registered Nurse Anesthetist

## 2015-09-23 ENCOUNTER — Ambulatory Visit (HOSPITAL_COMMUNITY): Payer: Commercial Managed Care - HMO | Admitting: Vascular Surgery

## 2015-09-23 ENCOUNTER — Ambulatory Visit (HOSPITAL_COMMUNITY)
Admission: RE | Admit: 2015-09-23 | Discharge: 2015-09-23 | Disposition: A | Discharge status: Home / Self Care | Payer: Commercial Managed Care - HMO | Source: Ambulatory Visit | Attending: Dentistry | Admitting: Dentistry

## 2015-09-23 ENCOUNTER — Encounter (HOSPITAL_COMMUNITY)
Admission: RE | Disposition: A | Discharge status: Home / Self Care | Payer: Self-pay | Source: Ambulatory Visit | Attending: Dentistry

## 2015-09-23 ENCOUNTER — Encounter (HOSPITAL_COMMUNITY): Payer: Self-pay | Admitting: Certified Registered Nurse Anesthetist

## 2015-09-23 DIAGNOSIS — M278 Other specified diseases of jaws: Secondary | ICD-10-CM

## 2015-09-23 DIAGNOSIS — K045 Chronic apical periodontitis: Secondary | ICD-10-CM | POA: Diagnosis not present

## 2015-09-23 DIAGNOSIS — K029 Dental caries, unspecified: Secondary | ICD-10-CM | POA: Insufficient documentation

## 2015-09-23 DIAGNOSIS — K083 Retained dental root: Secondary | ICD-10-CM | POA: Insufficient documentation

## 2015-09-23 DIAGNOSIS — Z87891 Personal history of nicotine dependence: Secondary | ICD-10-CM | POA: Diagnosis not present

## 2015-09-23 DIAGNOSIS — M899 Disorder of bone, unspecified: Secondary | ICD-10-CM | POA: Insufficient documentation

## 2015-09-23 DIAGNOSIS — M270 Developmental disorders of jaws: Secondary | ICD-10-CM

## 2015-09-23 DIAGNOSIS — K0889 Other specified disorders of teeth and supporting structures: Secondary | ICD-10-CM | POA: Diagnosis not present

## 2015-09-23 DIAGNOSIS — K0530 Chronic periodontitis, unspecified: Secondary | ICD-10-CM

## 2015-09-23 DIAGNOSIS — I712 Thoracic aortic aneurysm, without rupture: Secondary | ICD-10-CM | POA: Insufficient documentation

## 2015-09-23 DIAGNOSIS — I7121 Aneurysm of the ascending aorta, without rupture: Secondary | ICD-10-CM

## 2015-09-23 HISTORY — PX: MULTIPLE EXTRACTIONS WITH ALVEOLOPLASTY: SHX5342

## 2015-09-23 SURGERY — MULTIPLE EXTRACTION WITH ALVEOLOPLASTY
Anesthesia: General | Site: Mouth

## 2015-09-23 MED ORDER — OXYMETAZOLINE HCL 0.05 % NA SOLN
NASAL | Status: AC
  Filled 2015-09-23: qty 15

## 2015-09-23 MED ORDER — ROCURONIUM BROMIDE 100 MG/10ML IV SOLN
INTRAVENOUS | Status: DC | PRN
  Administered 2015-09-23: 50 mg via INTRAVENOUS

## 2015-09-23 MED ORDER — OXYCODONE-ACETAMINOPHEN 5-325 MG PO TABS: 1.0000 | ORAL_TABLET | ORAL | Status: DC | PRN

## 2015-09-23 MED ORDER — ONDANSETRON HCL 4 MG/2ML IJ SOLN
INTRAMUSCULAR | Status: DC | PRN
  Administered 2015-09-23: 4 mg via INTRAVENOUS

## 2015-09-23 MED ORDER — FENTANYL CITRATE (PF) 250 MCG/5ML IJ SOLN
INTRAMUSCULAR | Status: AC
  Filled 2015-09-23: qty 5

## 2015-09-23 MED ORDER — PHENYLEPHRINE 40 MCG/ML (10ML) SYRINGE FOR IV PUSH (FOR BLOOD PRESSURE SUPPORT)
PREFILLED_SYRINGE | INTRAVENOUS | Status: AC
  Filled 2015-09-23: qty 10

## 2015-09-23 MED ORDER — PROPOFOL 10 MG/ML IV BOLUS
INTRAVENOUS | Status: DC | PRN
  Administered 2015-09-23: 150 mg via INTRAVENOUS

## 2015-09-23 MED ORDER — HYDROMORPHONE HCL 1 MG/ML IJ SOLN
INTRAMUSCULAR | Status: AC
  Filled 2015-09-23: qty 1

## 2015-09-23 MED ORDER — LIDOCAINE-EPINEPHRINE 2 %-1:100000 IJ SOLN
INTRAMUSCULAR | Status: DC | PRN
  Administered 2015-09-23 (×6): 1.7 mL via INTRADERMAL

## 2015-09-23 MED ORDER — NEOSTIGMINE METHYLSULFATE 10 MG/10ML IV SOLN
INTRAVENOUS | Status: DC | PRN
  Administered 2015-09-23: 2 mg via INTRAVENOUS

## 2015-09-23 MED ORDER — SUCCINYLCHOLINE CHLORIDE 20 MG/ML IJ SOLN
INTRAMUSCULAR | Status: AC
  Filled 2015-09-23: qty 1

## 2015-09-23 MED ORDER — ONDANSETRON HCL 4 MG/2ML IJ SOLN
INTRAMUSCULAR | Status: AC
  Filled 2015-09-23: qty 2

## 2015-09-23 MED ORDER — LACTATED RINGERS IV SOLN: INTRAVENOUS | Status: DC

## 2015-09-23 MED ORDER — MIDAZOLAM HCL 5 MG/5ML IJ SOLN
INTRAMUSCULAR | Status: DC | PRN
  Administered 2015-09-23: 2 mg via INTRAVENOUS

## 2015-09-23 MED ORDER — BUPIVACAINE-EPINEPHRINE 0.5% -1:200000 IJ SOLN
INTRAMUSCULAR | Status: DC | PRN
  Administered 2015-09-23: 3.6 mL

## 2015-09-23 MED ORDER — MEPERIDINE HCL 25 MG/ML IJ SOLN: 6.2500 mg | INTRAMUSCULAR | Status: DC | PRN

## 2015-09-23 MED ORDER — LACTATED RINGERS IV SOLN
INTRAVENOUS | Status: DC | PRN
  Administered 2015-09-23 (×2): via INTRAVENOUS

## 2015-09-23 MED ORDER — MIDAZOLAM HCL 2 MG/2ML IJ SOLN
INTRAMUSCULAR | Status: AC
  Filled 2015-09-23: qty 2

## 2015-09-23 MED ORDER — SODIUM CHLORIDE 0.9 % IJ SOLN
INTRAMUSCULAR | Status: AC
  Filled 2015-09-23: qty 10

## 2015-09-23 MED ORDER — PROPOFOL 10 MG/ML IV BOLUS
INTRAVENOUS | Status: AC
  Filled 2015-09-23: qty 20

## 2015-09-23 MED ORDER — BUPIVACAINE-EPINEPHRINE (PF) 0.5% -1:200000 IJ SOLN
INTRAMUSCULAR | Status: AC
  Filled 2015-09-23: qty 3.6

## 2015-09-23 MED ORDER — 0.9 % SODIUM CHLORIDE (POUR BTL) OPTIME
TOPICAL | Status: DC | PRN
  Administered 2015-09-23: 1000 mL

## 2015-09-23 MED ORDER — HYDROMORPHONE HCL 1 MG/ML IJ SOLN
0.2500 mg | INTRAMUSCULAR | Status: DC | PRN
  Administered 2015-09-23 (×2): 0.5 mg via INTRAVENOUS

## 2015-09-23 MED ORDER — OXYCODONE-ACETAMINOPHEN 5-325 MG PO TABS: ORAL_TABLET | ORAL | Status: DC

## 2015-09-23 MED ORDER — LIDOCAINE HCL (CARDIAC) 20 MG/ML IV SOLN
INTRAVENOUS | Status: DC | PRN
  Administered 2015-09-23: 100 mg via INTRAVENOUS

## 2015-09-23 MED ORDER — FENTANYL CITRATE (PF) 100 MCG/2ML IJ SOLN
INTRAMUSCULAR | Status: DC | PRN
  Administered 2015-09-23: 50 ug via INTRAVENOUS
  Administered 2015-09-23: 150 ug via INTRAVENOUS
  Administered 2015-09-23: 50 ug via INTRAVENOUS

## 2015-09-23 MED ORDER — ONDANSETRON HCL 4 MG/2ML IJ SOLN: 4.0000 mg | Freq: Once | INTRAMUSCULAR | Status: DC | PRN

## 2015-09-23 MED ORDER — ROCURONIUM BROMIDE 50 MG/5ML IV SOLN
INTRAVENOUS | Status: AC
  Filled 2015-09-23: qty 1

## 2015-09-23 MED ORDER — LIDOCAINE-EPINEPHRINE 2 %-1:100000 IJ SOLN
INTRAMUSCULAR | Status: AC
  Filled 2015-09-23: qty 10.2

## 2015-09-23 MED ORDER — GLYCOPYRROLATE 0.2 MG/ML IJ SOLN
INTRAMUSCULAR | Status: DC | PRN
  Administered 2015-09-23: 0.4 mg via INTRAVENOUS

## 2015-09-23 MED ORDER — LIDOCAINE HCL (CARDIAC) 20 MG/ML IV SOLN
INTRAVENOUS | Status: AC
  Filled 2015-09-23: qty 5

## 2015-09-23 MED ORDER — EPHEDRINE SULFATE 50 MG/ML IJ SOLN
INTRAMUSCULAR | Status: AC
  Filled 2015-09-23: qty 1

## 2015-09-23 SURGICAL SUPPLY — 34 items
ALCOHOL 70% 16 OZ (MISCELLANEOUS) ×2
ATTRACTOMAT 16X20 MAGNETIC DRP (DRAPES) ×2
BLADE SURG 15 STRL LF DISP TIS (BLADE) ×2
BLADE SURG 15 STRL SS (BLADE) ×2
COVER SURGICAL LIGHT HANDLE (MISCELLANEOUS) ×2
GAUZE PACKING FOLDED 2  STR (GAUZE/BANDAGES/DRESSINGS) ×1
GAUZE PACKING FOLDED 2 STR (GAUZE/BANDAGES/DRESSINGS) ×1
GAUZE SPONGE 4X4 16PLY XRAY LF (GAUZE/BANDAGES/DRESSINGS) ×6
GLOVE BIOGEL PI IND STRL 6 (GLOVE) ×1
GLOVE BIOGEL PI INDICATOR 6 (GLOVE) ×1
GLOVE SURG ORTHO 8.0 STRL STRW (GLOVE) ×2
GLOVE SURG SS PI 6.0 STRL IVOR (GLOVE) ×2
GOWN STRL REUS W/ TWL LRG LVL3 (GOWN DISPOSABLE) ×1
GOWN STRL REUS W/TWL 2XL LVL3 (GOWN DISPOSABLE) ×2
GOWN STRL REUS W/TWL LRG LVL3 (GOWN DISPOSABLE) ×1
HEMOSTAT SURGICEL 2X14 (HEMOSTASIS) ×2
KIT BASIN OR (CUSTOM PROCEDURE TRAY) ×2
KIT ROOM TURNOVER OR (KITS) ×2
MANIFOLD NEPTUNE WASTE (CANNULA) ×2
NEEDLE 27GAX1/2IN MONOJET (NEEDLE) ×4
NEEDLE BLUNT 16X1.5 OR ONLY (NEEDLE) ×2
NS IRRIG 1000ML POUR BTL (IV SOLUTION) ×2
PACK EENT II TURBAN DRAPE (CUSTOM PROCEDURE TRAY) ×2
PAD ARMBOARD 7.5X6 YLW CONV (MISCELLANEOUS) ×2
SPONGE SURGIFOAM ABS GEL 100 (HEMOSTASIS)
SPONGE SURGIFOAM ABS GEL 12-7 (HEMOSTASIS)
SPONGE SURGIFOAM ABS GEL SZ50 (HEMOSTASIS)
SUCTION FRAZIER TIP 10 FR DISP (SUCTIONS) ×2
SUT CHROMIC 3 0 PS 2 (SUTURE) ×8
SUT CHROMIC 4 0 P 3 18 (SUTURE)
SYR 50ML SLIP (SYRINGE) ×2
TOWEL OR 17X26 10 PK STRL BLUE (TOWEL DISPOSABLE) ×2
TUBE CONNECTING 12X1/4 (SUCTIONS) ×2
YANKAUER SUCT BULB TIP NO VENT (SUCTIONS) ×2

## 2015-09-23 NOTE — Progress Notes (Signed)
PRE-OPERATIVE NOTE:  09/23/2015 [name] QueryNorris R Scherman [id number]  VITALS: BP 122/69 mmHg  Pulse 72  Temp(Src) 97.9 F (36.6 C) (Oral)  Resp 20  Wt 349 lb (158.305 kg)  SpO2 97%  Lab Results  Component Value Date   WBC 10.0 09/17/2015   HGB 13.9 09/17/2015   HCT 41.5 09/17/2015   MCV 87.9 09/17/2015   PLT 347 09/17/2015   BMET    Component Value Date/Time   NA 135 09/17/2015 1312   K 4.0 09/17/2015 1312   CL 105 09/17/2015 1312   CO2 21* 09/17/2015 1312   GLUCOSE 96 09/17/2015 1312   BUN 15 09/17/2015 1312   CREATININE 0.97 09/17/2015 1312   CALCIUM 8.8* 09/17/2015 1312   GFRNONAA >60 09/17/2015 1312   GFRAA >60 09/17/2015 1312    No results found for: INR, PROTIME No results found for: PTT   [name] QueryNorris R Kessen presents for extraction remaining teeth with alveoloplasty and pre-prosthetic surgery as indicated in the operating room and general anesthesia.   SUBJECTIVE: The patient denies any acute medical or dental changes and agrees to proceed with treatment as planned.  EXAM: No sign of acute dental changes.  ASSESSMENT: Patient is affected by chronic apical periodontitis, chronic periodontitis, multiple retained root segments, dental caries, lateral exostoses, and loose teeth.   PLAN: Patient agrees to proceed with treatment as planned in the operating room as previously discussed and accepts the risks, benefits, and complications of the proposed treatment. Patient is aware of the risk for bleeding, bruising, swelling, infection, pain, nerve damage, soft tissue damage, sinus involvement, root tip fracture, mandible fracture, and the risks of complications associated with the anesthesia. Patient also is aware of the potential for other complications up to and including death due to overall cardiovascular compromise.    Charlynne Panderonald F. Kulinski, DDS

## 2015-09-23 NOTE — Anesthesia Postprocedure Evaluation (Signed)
Anesthesia Post Note  Patient: Ronald Harding  Procedure(s) Performed: Procedure(s) (LRB): Extraction of tooth #'s 1-12,14- 28, 30-32 with alveoloplasty, bilateral mandibular tori reductions and reduction of lateral exostoses. (N/A)  Patient location during evaluation: PACU Anesthesia Type: General Level of consciousness: awake and alert Pain management: pain level controlled Vital Signs Assessment: post-procedure vital signs reviewed and stable Respiratory status: spontaneous breathing, nonlabored ventilation, respiratory function stable and patient connected to nasal cannula oxygen Cardiovascular status: blood pressure returned to baseline and stable Postop Assessment: no signs of nausea or vomiting Anesthetic complications: no    Last Vitals:  Filed Vitals:   09/23/15 1054 09/23/15 1106  BP: 126/63 101/66  Pulse: 98 90  Temp:  37 C  Resp: 11 13    Last Pain:  Filed Vitals:   09/23/15 1143  PainSc: Asleep                 Carlisa Eble DAVID

## 2015-09-23 NOTE — H&P (Signed)
09/23/2015  Patient:            Ronald Harding Date of Birth:  01/14/1956 MRN:                [id number]   BP 122/69 mmHg  Pulse 72  Temp(Src) 97.9 F (36.6 C) (Oral)  Resp 20  Wt 349 lb (158.305 kg)  SpO2 97%  Ronald QueryNorris R Driggers presents for extraction remaining teeth with alveoloplasty and pre-prosthetic surgery as needed in the operating room with general anesthesia. Patient denies any acute medical or dental changes. Please see note from Dr. Donata ClayVan Trigt on 09/06/15 to act as H and P  for the dental operating room procedure.  Charlynne Panderonald F Kulinski, DDS   09/06/15  Chief Complaint  Patient presents with  . Thoracic Aortic Aneurysm    Surgical eval, CTA Chest 07/19/15  4.6 cm fusiform ascending aortic aneurysm 2 cm anterior mediastinal lymph node  Patient examined, CTA of thoracic aorta and chest personally reviewed  HPI:  The patient is a [age] year old AA is morbidly obese male with recent onset of hypertension. He has had a chronic nonproductive cough this past fall. CT scan of the chest was performed by his primary physician. This shows a 4.6 cm fusiform ascending aortic dilatation without evidence of ulceration or hematoma. It is asymptomatic. The lung fields are clear of pulmonary nodules or mass. There is an anterior mediastinal lymph node measuring 2 cm anterior to the aortic arch at the level V lymph node station. The patient denies hemoptysis, night sweats, fever, or weight loss.  The patient states he had asthma as a child but has never been a smoker. He denies history of recurrent pneumonia. The patient has had problems with chronic sinus inflammation-infection following a fall in 2014 the roof onto his face and left shoulder. He had facial fractures, broken teeth, left shoulder dislocation, and a prolonged abdominal ileus. He did not require tracheostomy for intubation.  The patient denies family history of thoracic aortic or abdominal aortic aneurysm disease or aortic  dissection. The patient has not had an echocardiogram and denies any cardiac history.  No past medical history on file.  Past Surgical History  Procedure Laterality Date  . Nose surgery  2013  . Ankle surgery Right 2012    Family History  Problem Relation Age of Onset  . Hypertension Mother   . Arthritis Mother   . Hypertension Father   . Heart disease Father   . Arthritis Father     Social History Social History  Substance Use Topics  . Smoking status: Former Games developermoker  . Smokeless tobacco: None  . Alcohol Use: No    Current Outpatient Prescriptions  Medication Sig Dispense Refill  . losartan-hydrochlorothiazide (HYZAAR) 50-12.5 MG tablet Take 1 tablet by mouth daily. 30 tablet 5   No current facility-administered medications for this visit.    Allergies  Allergen Reactions  . Betadine [Povidone Iodine] Hives    Pt states in 2012 in [place] he fell and broke several bones. Betadine broke him out.     Review of Systems \    Review of Systems : [ y ] = yes, [ ]  = no   General : Weight gain [ yes ] Weight loss [ ]  Fatigue [ ]  Fever [ ]  Chills [ ]   Weakness [ ]    Cardiac : Chest pain/ pressure [ ]  Resting SOB [ ]  exertional SOB [ ]   Orthopnea [ ]  Pedal edema [ ]  Palpitations [ ]   Syncope/presyncope   Paroxysmal nocturnal dyspnea   Pulmonary : cough Mahler.Beck ] wheezing  Hemoptysis  Sputum  Snoring [ yes ]  Pneumothorax  Sleep apnea   GI : Vomiting  Dysphagia  Melena  Abdominal pain  BRBPR   Heart burn  Constipation  Diarrhea  Colonoscopy [ yes colonoscopy performed and was negative ]  GU : Hematuria  Dysuria  Nocturia  UTI's    Vascular : Claudication  Rest pain  DVT  Vein stripping  leg ulcers   TIA  Stroke  Varicose veins   NEURO : Headaches  Seizures  Vision changes  Paresthesias   Musculoskeletal : Arthritis [ yes ] Gout  Back pain  Joint pain [ yes ]the patient fractured his right ankle in the fall to the roof in 2014 and had pins. He has chronic swelling and soreness in the ankle.  Skin : Rash  Melanoma    Heme : Bleeding problems Clotting Disorders  Anemia Blood Transfusion   Endocrine : Diabetes [no ] Thyroid Disorder   Psych : Depression  Anxiety  Psych hospitalizations             BP 148/90 mmHg  Pulse 88  Resp 20  Ht 6' (1.829 m)  Wt 342 lb (155.13 kg)  BMI 46.37 kg/m2  SpO2 97% Physical Exam    Physical Exam  General: pleasant but morbidly obese AA male accompanied by wife and daughter no distress HEENT: Normocephalic pupils equal , dentition Porro so broken necrotic teeth Neck: Supple without JVD, adenopathy, or bruit Chest: Clear to auscultation, symmetrical breath sounds, no rhonchi, no tenderness  or deformity Cardiovascular: Regular rate and rhythm, no murmur, no gallop, peripheral pulses  palpable in all extremities Abdomen: Soft, obese,nontender, no palpable mass or organomegaly Extremities: Warm, well-perfused, no clubbing cyanosis   Right lower extremity ankle with chronic edema and changes of venous stasis   Rectal/GU: Deferred Neuro: Grossly non--focal and symmetrical throughout Skin: Clean and dry without rash or ulceration   Diagnostic Tests: CT scan personally reviewed and results counseled with patient and family.  Impression: Mild-moderate fusiform dilatation of the ascending aorta Currently he is at low risk for  aortic dissection. With adequate blood pressure control and some weight loss it should not pose a significant problem but will need to be followed with serial CT scans. Surgery would not be recommended until the diameter exceeds 5.5 cm.  2 cm anterior mediastinal lymph node which appears inflammatory or reactionary but will need to be followed  Plan:repeat CT scan in 6-8 months. I recommended the patient keep record of his blood pressure measurements at home.  d[facility] consultation [facility] for multiple necrotic teeth which will need to be removed prior to any cardiac surgery or thoracic aneurysm repair.  Return in 6 months with CTA of the thoracic aorta to review fusiform aneurysm and anterior mediastinal adenopathy Mikey Bussing, MD [facility] [phone]

## 2015-09-23 NOTE — Anesthesia Procedure Notes (Signed)
Procedure Name: Intubation Date/Time: 09/23/2015 7:47 AM Performed by: Rogelia BogaMUELLER, Dalal Livengood P Pre-anesthesia Checklist: Patient identified, Emergency Drugs available, Suction available, Patient being monitored and Timeout performed Patient Re-evaluated:Patient Re-evaluated prior to inductionOxygen Delivery Method: Circle system utilized Preoxygenation: Pre-oxygenation with 100% oxygen Intubation Type: IV induction Ventilation: Mask ventilation without difficulty Laryngoscope Size: Glidescope and 4 Nasal Tubes: Left, Nasal Rae and Nasal prep performed Tube size: 7.0 mm Number of attempts: 1 Airway Equipment and Method: Video-laryngoscopy Placement Confirmation: ETT inserted through vocal cords under direct vision,  positive ETCO2 and breath sounds checked- equal and bilateral Tube secured with: Tape Dental Injury: Teeth and Oropharynx as per pre-operative assessment  Comments: Nasal intubation via Left nares utilizing glidescope, 7.0 Nasal rae tube easily placed, +ETC02, BBS=, VSS

## 2015-09-23 NOTE — Op Note (Signed)
OPERATIVE REPORT   Patient:            Ronald Harding Date of Birth:  17-Apr-1956 MRN:                [id number]   DATE OF PROCEDURE:  09/23/2015  PREOPERATIVE DIAGNOSES: 1. Thoracic ascending aortic aneurysm 2. Pre-aneurysm repair dental protocol 3. Chronic apical periodontitis 4. Multiple retained root segments 5. Rampant dental caries 6. Bilateral mandibular lingual tori 7. Lateral exostoses  POSTOPERATIVE DIAGNOSES: 1. Thoracic ascending aortic aneurysm 2. Pre-aneurysm repair dental protocol 3. Chronic apical periodontitis 4. Multiple retained root segments 5. Rampant dental caries 6. Bilateral mandibular lingual tori 7. Lateral exostoses  OPERATIONS: 1. Multiple extraction of tooth numbers 1-12, 14-28, and 30-32. 2. 4 Quadrants of alveoloplasty 3. Bilateral mandibular lingual tori reductions 4. Lateral exostoses reductions   SURGEON: Charlynne Pander, DDS  ASSISTANT: Rory Percy, (dental assistant)  ANESTHESIA: General anesthesia via nasoendotracheal tube.  MEDICATIONS: 1. Ancef 3 g IV prior to invasive dental procedures. 2. Local anesthesia with a total utilization of 6 carpules each containing 34 mg of lidocaine with 0.017 mg of epinephrine as well as 2 carpules each containing 9 mg of bupivacaine with 0.009 mg of epinephrine.  SPECIMENS: There are 30 teeth that were discarded.  DRAINS: None  CULTURES: None  COMPLICATIONS: None   ESTIMATED BLOOD LOSS: 150 mLs.  INTRAVENOUS FLUIDS: 1400 mLs of Lactated ringers solution.  INDICATIONS: The patient was recently diagnosed with a thoracic ascending aortic aneurysm.  A medically necessary dental consultation was then requested to evaluate poor dentition.  The patient was examined and treatment planned for extraction of remaining teeth with alveoloplasty and pre-prosthetic surgery as needed in the operating room with general anesthesia.  This treatment plan was formulated to decrease the risks and  complications associated with dental infection from affecting the patient's systemic health and the anticipated aortic aneurysm repair.  OPERATIVE FINDINGS: Patient was examined operating room number 11.  The teeth were identified for extraction. A supernumerary tooth in the maxillary left canine area was NOT noted during the dental extraction procedures and was felt to represent radiographic overlap associated with tooth #12. The patient was noted be affected by chronic periodontitis, chronic apical periodontitis, multiple retained root segments, rampant dental caries, bilateral mandibular lingual tori, and lateral exostoses.   DESCRIPTION OF PROCEDURE: Patient was brought to the main operating room number 11. Patient was then placed in the supine position on the operating table. General anesthesia was then induced per the anesthesia team. The patient was then prepped and draped in the usual manner for dental medicine procedure. A timeout was performed. The patient was identified and procedures were verified. A throat pack was placed at this time. The oral cavity was then thoroughly examined with the findings noted above. The patient was then ready for dental medicine procedure as follows:  Local anesthesia was then administered sequentially with a total utilization of 6 carpules each containing 34 mg of lidocaine with 0.017 mg of epinephrine as well as 2 carpules  each containing 9 mg bupivacaine with 0.009 mg of epinephrine.  The Maxillary left and right quadrants first approached. Anesthesia was then delivered utilizing infiltration with lidocaine with epinephrine. A #15 blade incision was then made from the maxillary right tuberosity and extended to the maxillary left tuberosity.  A  surgical flap was then carefully reflected. The maxillary teeth were then subluxated with a series of straight elevators.  Tooth numbers 2, 3, and a  coronal aspect of tooth #1 was removed with a 53R forceps leaving the  roots of tooth #1 remaining. Tooth numbers 4, 5, 7, 8, 9, 10, 12, 14, 15 and 16 were then removed 150 forceps without complications. There was no supernumerary tooth noted in the area of 11 or 12.  Appropriate amounts of buccal and interseptal bone were then removed utilizing a surgical handpiece and bur and copious amounts of sterile water around tooth numbers 1, 6, and 11.  The remaining roots associated with tooth #1, tooth #6, and tooth #11 were then removed with a 150 forceps without further complications. Alveoloplasty was then performed utilizing a ronguers and bone file. The surgical site was then irrigated with copious amounts of sterile saline. The tissues were approximated and trimmed appropriately. The maxillary right surgical site was then closed from the maxillary right tuberosity and extended to the mesial #8 utilizing 3-0 chromic gut suture in a continuous interrupted suture technique 1.  The maxillary left surgical site was then closed from the maxillary left tuberosity and extended the mesial #9 utilizing 3-0 chromic gut suture in a continuous interrupted suture technique 1. 2 individual interrupted sutures are then placed to further closed surgical site with 3-0 chromic gut material.  At this point time, the mandibular quadrants were approached. The patient was given bilateral inferior alveolar nerve blocks and long buccal nerve blocks utilizing the bupivacaine with epinephrine. Further infiltration was then achieved utilizing the lidocaine with epinephrine. A 15 blade incision was then made from the distal of number 17 and extended to the distal of #32.  A surgical flap was then carefully reflected. The lower teeth were then subluxated with a series of straight elevators.  Tooth numbers 17, 18, 19, 20, 21, 23, 24, 25, 26, 28, 30, 31, and 32 were then removed with a 151 forceps without complications.  Appropriate amounts of buccal and interseptal bone were then removed utilizing a surgical  handpiece and copious amount of sterile water around tooth numbers 22 and 27. Tooth numbers 22 and 27 were then removed with a 151 forceps without further complications. Alveoloplasty was then performed utilizing a rongeurs and bone file. At this point time the lingual flaps were further reflected bilaterally to expose the posterior lateral exostoses and the bilateral mandibular lingual tori.  The lateral exostoses and mandibular tori were then reduced utilizing a surgical handpiece and bur and copious amounts of sterile water. Further alveoloplasty was then performed utilizing a rongeur and bone file. The tissues were approximated and trimmed appropriately. The surgical sites were then irrigated with copious amounts of sterile saline  6. The mandibular left surgical site was then closed from the distal of  17 and extended to the mesial of #24 utilizing 3-0 chromic gut suture in a continuous interrupted suture technique 1. The mandibular right surgical site was then closed from the distal of #32 and extended to the mesial #25 utilizing 3-0 chromic gut suture in a continuous interrupted suture technique 1.   At this point time, the entire mouth was irrigated with copious amounts of sterile saline. The patient was examined for complications, seeing none, the dental medicine procedure was deemed to be complete. The throat pack was removed at this time. An oral airway was then placed at the request of the anesthesia team. A series of 4 x 4 gauze were placed in the mouth to aid hemostasis. The patient was then handed over to the anesthesia team for final disposition. After an appropriate amount of  time, the patient was extubated and taken to the postanesthsia care unit in good condition. All counts were correct for the dental medicine procedure. The patient is to use Percocet 5/325 for pain medication. Patient is to take one to 2 tablets by mouth every 4-6 hours as needed for pain. Patient is return to dental  medicine in 7-10 days for evaluation for suture removal. Patient will then follow-up with Dr. Dr. Donata Clay for scheduling of aneurysm repair as indicated   Charlynne Pander, DDS.

## 2015-09-23 NOTE — Anesthesia Preprocedure Evaluation (Addendum)
Anesthesia Evaluation  Patient identified by MRN, date of birth, ID band Patient awake    Reviewed: Allergy & Precautions, NPO status , Patient's Chart, lab work & pertinent test results  Airway Mallampati: II  TM Distance: >3 FB Neck ROM: Full    Dental  (+) Poor Dentition, Dental Advisory Given   Pulmonary former smoker,    Pulmonary exam normal        Cardiovascular hypertension, Pt. on medications + Peripheral Vascular Disease  Normal cardiovascular exam     Neuro/Psych    GI/Hepatic   Endo/Other  Morbid obesity  Renal/GU      Musculoskeletal   Abdominal   Peds  Hematology   Anesthesia Other Findings   Reproductive/Obstetrics                            Anesthesia Physical Anesthesia Plan  ASA: III  Anesthesia Plan: General   Post-op Pain Management:    Induction: Intravenous  Airway Management Planned: Nasal ETT  Additional Equipment:   Intra-op Plan:   Post-operative Plan: Extubation in OR  Informed Consent: I have reviewed the patients History and Physical, chart, labs and discussed the procedure including the risks, benefits and alternatives for the proposed anesthesia with the patient or authorized representative who has indicated his/her understanding and acceptance.   Dental advisory given  Plan Discussed with: CRNA and Surgeon  Anesthesia Plan Comments:        Anesthesia Quick Evaluation

## 2015-09-23 NOTE — Discharge Instructions (Signed)

## 2015-09-23 NOTE — Transfer of Care (Signed)
Immediate Anesthesia Transfer of Care Note  Patient: [name] Queryorris R Deupree  Procedure(s) Performed: Procedure(s): Extraction of tooth #'s 1-12,14- 28, 30-32 with alveoloplasty, bilateral mandibular tori reductions and reduction of lateral exostoses. (N/A)  Patient Location: PACU  Anesthesia Type:General  Level of Consciousness: awake, alert , oriented and patient cooperative  Airway & Oxygen Therapy: Patient Spontanous Breathing and Patient connected to nasal cannula oxygen  Post-op Assessment: Report given to RN, Post -op Vital signs reviewed and stable and Patient moving all extremities X 4  Post vital signs: Reviewed and stable  Last Vitals:  Filed Vitals:   09/23/15 0624  BP: 122/69  Pulse: 72  Temp: 36.6 C  Resp: 20    Complications: No apparent anesthesia complications

## 2015-09-24 ENCOUNTER — Encounter (HOSPITAL_COMMUNITY): Payer: Self-pay | Admitting: Dentistry

## 2015-10-04 ENCOUNTER — Encounter (INDEPENDENT_AMBULATORY_CARE_PROVIDER_SITE_OTHER): Payer: Self-pay

## 2015-10-04 ENCOUNTER — Ambulatory Visit (HOSPITAL_COMMUNITY): Payer: Medicaid - Dental | Admitting: Dentistry

## 2015-10-04 ENCOUNTER — Encounter (HOSPITAL_COMMUNITY): Payer: Self-pay | Admitting: Dentistry

## 2015-10-04 VITALS — BP 124/68 | HR 83 | Temp 98.7°F

## 2015-10-04 DIAGNOSIS — I7121 Aneurysm of the ascending aorta, without rupture: Secondary | ICD-10-CM

## 2015-10-04 DIAGNOSIS — T148XXD Other injury of unspecified body region, subsequent encounter: Secondary | ICD-10-CM

## 2015-10-04 DIAGNOSIS — K08109 Complete loss of teeth, unspecified cause, unspecified class: Secondary | ICD-10-CM

## 2015-10-04 DIAGNOSIS — I712 Thoracic aortic aneurysm, without rupture: Secondary | ICD-10-CM

## 2015-10-04 DIAGNOSIS — M898X9 Other specified disorders of bone, unspecified site: Secondary | ICD-10-CM

## 2015-10-04 MED ORDER — CHLORHEXIDINE GLUCONATE 0.12 % MT SOLN: OROMUCOSAL | Status: DC

## 2015-10-04 MED ORDER — OXYCODONE-ACETAMINOPHEN 5-325 MG PO TABS: ORAL_TABLET | ORAL | Status: DC

## 2015-10-04 NOTE — Patient Instructions (Signed)
PLAN: 1. Chlorhexidine rinses 0.12%. Patient is rinse with 15 ML's after breakfast and at bedtime. Prescription was sent to his pharmacy with PRN refills. 2. Salt water rinses every 2 hours in between the chlorhexidine rinses. 3. Advance diet as tolerated. Avoid trauma from foods such as cookies, taco chips, and other hard foods. 4. Percocet 5/325. Dispense #40. Take one to 2 tablets every 6 hours as needed for pain. 5. Return to clinic in early January 2017 for reevaluation of healing and discussion of fabrication of upper and lower complete dentures. Call if problems arise before then.    Charlynne Panderonald F. Kulinski, DDS

## 2015-10-04 NOTE — Progress Notes (Signed)
POST OPERATIVE NOTE:  10/04/2015 Ronald Harding [id number]  VITALS: BP 124/68 mmHg  Pulse 83  Temp(Src) 98.7 F (37.1 C) (Oral)  LABS:  Lab Results  Component Value Date   WBC 10.0 09/17/2015   HGB 13.9 09/17/2015   HCT 41.5 09/17/2015   MCV 87.9 09/17/2015   PLT 347 09/17/2015   BMET    Component Value Date/Time   NA 135 09/17/2015 1312   K 4.0 09/17/2015 1312   CL 105 09/17/2015 1312   CO2 21* 09/17/2015 1312   GLUCOSE 96 09/17/2015 1312   BUN 15 09/17/2015 1312   CREATININE 0.97 09/17/2015 1312   CALCIUM 8.8* 09/17/2015 1312   GFRNONAA >60 09/17/2015 1312   GFRAA >60 09/17/2015 1312    No results found for: INR, PROTIME No results found for: PTT   Ronald Harding is status post extraction remaining teeth with alveoloplasty and pre-prosthetic surgery in the operating room on 09/23/2015 with general anesthesia. Patient now presents for evaluation of healing and suture removal as indicated.  SUBJECTIVE: Patient currently complaining of some pain involving the mandibular anterior area and slight discomfort of the lower right and lower left lingual aspects. Patient denies having any active bleeding. Patient indicates that several stitches still remain.   EXAM: There is no sign of infection, heme, or ooze. Sutures are loosely intact. There is a 3 x 3 mm area of exposed bone and delayed healing involving the lower left lingual area #17 as well as 2 x 3 mm area of delayed healing involving the lower right lingual area #32. Patient is healing in by generalized primary closure but will need to heal in by secondary intention involving other sites.  PROCEDURE: The patient was given a chlorhexidine gluconate rinse for 30 seconds. Sutures were then removed without complication. Patient tolerated the procedure well.  ASSESSMENT: Post operative course is consistent with dental procedures performed in the operating room. Exposed bone involving the lower left lingual area #17  some delayed healing involving the lower right lingual area #32 The patient is now edentulous. Persistent dental discomfort from extraction sites.  PLAN: 1. Chlorhexidine rinses 0.12%. Patient is rinse with 15 ML's after breakfast and at bedtime. Prescription was sent to his pharmacy with PRN refills. 2. Salt water rinses every 2 hours in between the chlorhexidine rinses. 3. Advance diet as tolerated. Avoid trauma from foods such as cookies, taco chips, and other hard foods. 4. Percocet 5/325. Dispense #40. Take one to 2 tablets every 6 hours as needed for pain. 5. Return to clinic in early January 2017 for reevaluation of healing and discussion of fabrication of upper and lower complete dentures. Call if problems arise before then.    Charlynne Panderonald F. Nilda Keathley, DDS

## 2015-10-07 NOTE — Telephone Encounter (Signed)
[facility] Berkley Harveyauth #[id number] valid 09/23/2015 - 03/21/2016 for 6 visits

## 2015-10-26 DIAGNOSIS — H348312 Tributary (branch) retinal vein occlusion, right eye, stable: Secondary | ICD-10-CM | POA: Diagnosis not present

## 2015-10-26 DIAGNOSIS — H2513 Age-related nuclear cataract, bilateral: Secondary | ICD-10-CM | POA: Diagnosis not present

## 2015-10-26 DIAGNOSIS — H40013 Open angle with borderline findings, low risk, bilateral: Secondary | ICD-10-CM | POA: Diagnosis not present

## 2015-11-01 ENCOUNTER — Ambulatory Visit (HOSPITAL_COMMUNITY): Payer: Medicaid - Dental | Admitting: Dentistry

## 2015-11-01 VITALS — BP 111/73 | HR 68 | Temp 98.5°F

## 2015-11-01 DIAGNOSIS — H34831 Tributary (branch) retinal vein occlusion, right eye, with macular edema: Secondary | ICD-10-CM | POA: Diagnosis not present

## 2015-11-01 DIAGNOSIS — I10 Essential (primary) hypertension: Secondary | ICD-10-CM | POA: Diagnosis not present

## 2015-11-01 DIAGNOSIS — I7121 Aneurysm of the ascending aorta, without rupture: Secondary | ICD-10-CM

## 2015-11-01 DIAGNOSIS — K08109 Complete loss of teeth, unspecified cause, unspecified class: Secondary | ICD-10-CM

## 2015-11-01 DIAGNOSIS — I712 Thoracic aortic aneurysm, without rupture: Secondary | ICD-10-CM

## 2015-11-01 DIAGNOSIS — T148XXD Other injury of unspecified body region, subsequent encounter: Secondary | ICD-10-CM

## 2015-11-01 NOTE — Progress Notes (Signed)
POST OPERATIVE RE-EVALUATION   11/01/2015 Ronald Harding [id number]  VITALS: BP 111/73 mmHg  Pulse 68  Temp(Src) 98.5 F (36.9 C) (Oral)  LABS:  Lab Results  Component Value Date   WBC 10.0 09/17/2015   HGB 13.9 09/17/2015   HCT 41.5 09/17/2015   MCV 87.9 09/17/2015   PLT 347 09/17/2015   BMET    Component Value Date/Time   NA 135 09/17/2015 1312   K 4.0 09/17/2015 1312   CL 105 09/17/2015 1312   CO2 21* 09/17/2015 1312   GLUCOSE 96 09/17/2015 1312   BUN 15 09/17/2015 1312   CREATININE 0.97 09/17/2015 1312   CALCIUM 8.8* 09/17/2015 1312   GFRNONAA >60 09/17/2015 1312   GFRAA >60 09/17/2015 1312    No results found for: INR, PROTIME No results found for: PTT   Ronald Harding is status post extraction of remaining teeth with alveoloplasty and pre-prosthetic surgery in the operating room on 09/23/2015 with general anesthesia. Patient was seen for postoperative evaluation and suture removal on 10/04/2015. Delayed healing was noted involving the lower right and lower left posterior lingual areas.  The patient now presents for re-evaluation of healing.  SUBJECTIVE: Patient currently complaining of some slight tenderness involving the mandibular anterior area with chewing. Denies any discomfort with lower right and lower left lingual aspects.    EXAM: The patient is edentulous. There is no exposed bone involving the lower left lingual aspect area #17.  The patient has a small bony spicule in the area of #32 on the lingual that measures approximately 0.5 mm x 3 mm. Patient has healed in by generalized primary closure.  ASSESSMENT: Post operative course is consistent with dental procedures performed in the operating room. Exposed bone involving the lower left lingual area #17 has healed incompletely.  Persitent delayed healing area #32 on the lingual. The patient is now edentulous.  I discussed the risks, benefits, complications of partial ostectomy today to remove the  bony spicule area #32. We also discussed no treatment. Patient currently wishes to proceed with no treatment today and will continue salt water rinses and chlorhexidine rinses and return to clinic in 2 weeks for reevaluation of healing and start of fabrication of upper and lower complete dentures.  PLAN: 1. Chlorhexidine rinses 0.12%. Patient is rinse with 15 ML's after breakfast and at bedtime.  2. Salt water rinses every 2 hours in between the chlorhexidine rinses. 3. Advance diet as tolerated. Avoid trauma from foods such as cookies, taco chips, and other hard foods. 4. Return to clinic in two weeks for re-evaluation of healing and discussion of fabrication of upper and lower complete dentures. Quote for dentures was provided today. 5. Patient is cleared for aneurysm repair with Dr. Zenaida NieceVan trigt as per his discretion. Call if problems arise before then.    Charlynne Panderonald F. Kulinski, DDS

## 2015-11-01 NOTE — Patient Instructions (Addendum)
PLAN: 1. Chlorhexidine rinses 0.12%. Patient is rinse with 15 ML's after breakfast and at bedtime.  2. Salt water rinses every 2 hours in between the chlorhexidine rinses. 3. Advance diet as tolerated. Avoid trauma from foods such as cookies, taco chips, and other hard foods. 4. Return to clinic in two weeks for re-evaluation of healing and discussion of fabrication of upper and lower complete dentures. Quote for dentures was provided today. 5. Patient is cleared for aneurysm repair with Dr. Zenaida NieceVan trigt as per his discretion. Call if problems arise before then.    Ronald Harding, DDS

## 2015-11-15 ENCOUNTER — Encounter (HOSPITAL_COMMUNITY): Payer: Self-pay | Admitting: Dentistry

## 2015-11-15 ENCOUNTER — Ambulatory Visit (HOSPITAL_COMMUNITY): Payer: Medicaid - Dental | Admitting: Dentistry

## 2015-11-15 VITALS — BP 127/59 | HR 72 | Temp 98.4°F

## 2015-11-15 DIAGNOSIS — Z463 Encounter for fitting and adjustment of dental prosthetic device: Secondary | ICD-10-CM

## 2015-11-15 DIAGNOSIS — T148XXD Other injury of unspecified body region, subsequent encounter: Secondary | ICD-10-CM

## 2015-11-15 DIAGNOSIS — K08109 Complete loss of teeth, unspecified cause, unspecified class: Secondary | ICD-10-CM

## 2015-11-15 DIAGNOSIS — K0820 Unspecified atrophy of edentulous alveolar ridge: Secondary | ICD-10-CM

## 2015-11-15 DIAGNOSIS — I712 Thoracic aortic aneurysm, without rupture: Secondary | ICD-10-CM

## 2015-11-15 DIAGNOSIS — I7121 Aneurysm of the ascending aorta, without rupture: Secondary | ICD-10-CM

## 2015-11-15 NOTE — Progress Notes (Signed)
11/15/2015  Patient Name:   Ronald Harding Date of Birth:   [birth year] Medical Record Number: [id number]  BP 127/59 mmHg  Pulse 72  Temp(Src) 98.4 F (36.9 C) (Oral)  [name] Query presents for start of upper and lower denture fabrication.  Exam: Patient is edentulous. She has still has some delayed healing involving the lower right lingual alveolar ridge area #32. Patient refuses additional surgery at this time. Patient wishes to proceed with denture fabrication. E Discussed procedures involved in upper and lower denture fabrication and prognosis for successful ability to wear dentures. Price for dentures confirmed.  Patient agrees to proceed with upper and lower denture fabrication. Procedure:  Upper and lower denture primary impressions in alginate. Lab pour. To [facility] for upper and lower denture custom tray fabrication. RTC for upper and lower denture final impressions.  Charlynne Pander, DDS

## 2015-11-15 NOTE — Patient Instructions (Signed)
Return to clinic as scheduled for continued upper and lower complete denture fabrication. Dr. Waunetta Riggle 

## 2015-11-23 ENCOUNTER — Encounter (HOSPITAL_COMMUNITY): Payer: Self-pay | Admitting: Dentistry

## 2015-11-29 ENCOUNTER — Encounter: Payer: Self-pay | Admitting: Internal Medicine

## 2015-11-30 ENCOUNTER — Encounter (INDEPENDENT_AMBULATORY_CARE_PROVIDER_SITE_OTHER): Payer: Self-pay

## 2015-11-30 ENCOUNTER — Ambulatory Visit (HOSPITAL_COMMUNITY): Payer: Medicaid - Dental | Admitting: Dentistry

## 2015-11-30 ENCOUNTER — Encounter (HOSPITAL_COMMUNITY): Payer: Self-pay | Admitting: Dentistry

## 2015-11-30 VITALS — BP 128/71 | HR 62 | Temp 98.2°F

## 2015-11-30 DIAGNOSIS — Z463 Encounter for fitting and adjustment of dental prosthetic device: Secondary | ICD-10-CM

## 2015-11-30 DIAGNOSIS — I7121 Aneurysm of the ascending aorta, without rupture: Secondary | ICD-10-CM

## 2015-11-30 DIAGNOSIS — I712 Thoracic aortic aneurysm, without rupture: Secondary | ICD-10-CM

## 2015-11-30 DIAGNOSIS — K08109 Complete loss of teeth, unspecified cause, unspecified class: Secondary | ICD-10-CM

## 2015-11-30 NOTE — Progress Notes (Signed)
11/30/2015  Patient Name:   Ronald Harding Date of Birth:   1955/12/02 Medical Record Number: [id number]  BP 128/71 mmHg  Pulse 62  Temp(Src) 98.2 F (36.8 C) (Oral)  [name] Query presents for continued upper and lower complete denture fabrication.  Procedure:  Upper and lower border molding and final impressions in Aquasil. Patient tolerated procedure well. To [facility] for custom baseplates with rims. Return to clinic for upper and lower complete denture jaw relations.  Charlynne Pander, DDS

## 2015-11-30 NOTE — Patient Instructions (Signed)
Return to clinic as scheduled for continued upper and lower complete denture fabrication. Dr. Kulinski 

## 2015-12-10 DIAGNOSIS — Z463 Encounter for fitting and adjustment of dental prosthetic device: Secondary | ICD-10-CM

## 2015-12-10 DIAGNOSIS — K08109 Complete loss of teeth, unspecified cause, unspecified class: Secondary | ICD-10-CM

## 2015-12-13 ENCOUNTER — Encounter (INDEPENDENT_AMBULATORY_CARE_PROVIDER_SITE_OTHER): Payer: Self-pay

## 2015-12-13 ENCOUNTER — Encounter (HOSPITAL_COMMUNITY): Payer: Self-pay | Admitting: Dentistry

## 2015-12-13 ENCOUNTER — Ambulatory Visit (HOSPITAL_COMMUNITY): Payer: Medicaid - Dental | Admitting: Dentistry

## 2015-12-13 VITALS — BP 152/65 | HR 70 | Temp 97.6°F

## 2015-12-13 DIAGNOSIS — K08109 Complete loss of teeth, unspecified cause, unspecified class: Secondary | ICD-10-CM

## 2015-12-13 DIAGNOSIS — Z463 Encounter for fitting and adjustment of dental prosthetic device: Secondary | ICD-10-CM

## 2015-12-13 DIAGNOSIS — I7121 Aneurysm of the ascending aorta, without rupture: Secondary | ICD-10-CM

## 2015-12-13 DIAGNOSIS — K0820 Unspecified atrophy of edentulous alveolar ridge: Secondary | ICD-10-CM

## 2015-12-13 DIAGNOSIS — I712 Thoracic aortic aneurysm, without rupture: Secondary | ICD-10-CM

## 2015-12-13 NOTE — Patient Instructions (Signed)
Return to clinic as scheduled for continued upper and lower complete denture fabrication. Dr. Kulinski 

## 2015-12-13 NOTE — Progress Notes (Signed)
12/13/2015  Patient Name:   Ronald Harding Date of Birth:   09-09-1956 Medical Record Number: [id number]  BP 152/65 mmHg  Pulse 70  Temp(Src) 97.6 F (36.4 C) (Oral)  [name] Query presents for continued denture fabrication.  Procedure:  Upper and lower denture Jaw relations with aluwax bite registration. Patient agrees to tooth selection of 11G, R, and 10 degree posteriors to match with Portrait A2 shade. Patient tolerated procedure well. RTC for denture wax try in.   Charlynne Pander, DDS

## 2015-12-14 DIAGNOSIS — H34831 Tributary (branch) retinal vein occlusion, right eye, with macular edema: Secondary | ICD-10-CM | POA: Diagnosis not present

## 2015-12-20 ENCOUNTER — Encounter (HOSPITAL_COMMUNITY): Payer: Self-pay | Admitting: Dentistry

## 2015-12-20 ENCOUNTER — Ambulatory Visit (HOSPITAL_COMMUNITY): Payer: Medicaid - Dental | Admitting: Dentistry

## 2015-12-20 ENCOUNTER — Encounter (INDEPENDENT_AMBULATORY_CARE_PROVIDER_SITE_OTHER): Payer: Self-pay

## 2015-12-20 VITALS — BP 145/74 | HR 70 | Temp 98.1°F

## 2015-12-20 DIAGNOSIS — I712 Thoracic aortic aneurysm, without rupture: Secondary | ICD-10-CM

## 2015-12-20 DIAGNOSIS — Z463 Encounter for fitting and adjustment of dental prosthetic device: Secondary | ICD-10-CM

## 2015-12-20 DIAGNOSIS — K08109 Complete loss of teeth, unspecified cause, unspecified class: Secondary | ICD-10-CM

## 2015-12-20 DIAGNOSIS — I7121 Aneurysm of the ascending aorta, without rupture: Secondary | ICD-10-CM

## 2015-12-20 NOTE — Patient Instructions (Signed)
Return to clinic as scheduled for continued upper and lower complete denture fabrication. Dr. Jaelani Posa 

## 2015-12-20 NOTE — Progress Notes (Signed)
12/20/2015  Patient Name:   Ronald Harding Date of Birth:   17-Mar-1956 Medical Record Number: [id number]   BP 145/74 mmHg  Pulse 70  Temp(Src) 98.1 F (36.7 C) (Oral)  [name] Query presents for continued upper and lower denture fabrication.  Procedure:  Upper and lower denture wax tryin. Patient accepts esthetics, phonetics, fit and function. Patient agrees to process "as is" in 50:50 [place] 199. Patient to RTC for  upper and lower denture insertion.  Charlynne Pander, DDS

## 2015-12-28 ENCOUNTER — Ambulatory Visit (HOSPITAL_COMMUNITY): Payer: Medicaid - Dental | Admitting: Dentistry

## 2015-12-28 ENCOUNTER — Encounter (HOSPITAL_COMMUNITY): Payer: Self-pay | Admitting: Dentistry

## 2015-12-28 ENCOUNTER — Ambulatory Visit (INDEPENDENT_AMBULATORY_CARE_PROVIDER_SITE_OTHER): Payer: Medicare HMO | Admitting: Internal Medicine

## 2015-12-28 ENCOUNTER — Encounter: Payer: Self-pay | Admitting: Internal Medicine

## 2015-12-28 VITALS — BP 141/72 | HR 77 | Temp 98.4°F

## 2015-12-28 VITALS — BP 114/64 | HR 74 | Temp 98.7°F | Resp 16 | Ht 72.0 in | Wt 339.8 lb

## 2015-12-28 DIAGNOSIS — I10 Essential (primary) hypertension: Secondary | ICD-10-CM | POA: Diagnosis not present

## 2015-12-28 DIAGNOSIS — I7121 Aneurysm of the ascending aorta, without rupture: Secondary | ICD-10-CM

## 2015-12-28 DIAGNOSIS — I712 Thoracic aortic aneurysm, without rupture: Secondary | ICD-10-CM

## 2015-12-28 DIAGNOSIS — K08109 Complete loss of teeth, unspecified cause, unspecified class: Secondary | ICD-10-CM

## 2015-12-28 DIAGNOSIS — Z463 Encounter for fitting and adjustment of dental prosthetic device: Secondary | ICD-10-CM

## 2015-12-28 NOTE — Progress Notes (Signed)
12/28/2015  Patient Name:   Ronald Harding R Pries Date of Birth:   05/22/1956 Medical Record Number: [id number]  BP 141/72 mmHg  Pulse 77  Temp(Src) 98.4 F (36.9 C) (Oral)  Ronald QueryNorris R Bacchi presents for insertion of upper and lower complete dentures.  Procedure: Pressure indicating paste was applied to the dentures. Adjustments were made as needed. [place]. 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 Good esthetics, phonetics, fit, and function noted. Patient accepts results. Post op instructions provided in written and verbal formats on use and care of dentures. Gave patient denture brush and cup. Patient to keep dentures out if sore spots develop. Use salt water rinses as needed to aid healing. Return to clinic as scheduled for denture adjustment.   Call if problems arise before then.  Charlynne Panderonald F. Berkleigh Beckles, DDS

## 2015-12-28 NOTE — Progress Notes (Signed)
   Subjective:    Patient ID: Ronald Harding, male    DOB: 06/23/1956, 60 y.o.   MRN: [id number]  HPI The patient is a 60 YO male coming in for his weight and blood pressure. He is taking his medicine regularly and denies side effects. No chest pains or headaches. No SOB. He is not exercising at all right now. He has an old ankle injury and feels that he cannot do any exercise. He has worked with nutritionist at his last doctor's office and was not able to lose any weight. He is not sure if he was following their instructions.   Review of Systems  Constitutional: Negative for fever, activity change, appetite change, fatigue and unexpected weight change.  HENT: Negative for ear discharge, ear pain, sinus pressure, sore throat and trouble swallowing.   Respiratory: Negative for cough, chest tightness, shortness of breath and wheezing.   Cardiovascular: Negative for chest pain, palpitations and leg swelling.  Gastrointestinal: Negative for nausea, abdominal pain, diarrhea, constipation and abdominal distention.  Musculoskeletal: Negative.   Skin: Negative.   Neurological: Negative.   Psychiatric/Behavioral: Negative.       Objective:   Physical Exam  Constitutional: He appears well-developed and well-nourished.  Morbidly obese  HENT:  Head: Normocephalic and atraumatic.  Eyes: EOM are normal.  Neck: Normal range of motion.  Cardiovascular: Normal rate and regular rhythm.   Pulmonary/Chest: Effort normal and breath sounds normal. No respiratory distress. He has no wheezes. He has no rales.  Abdominal: Soft. Bowel sounds are normal. He exhibits no distension. There is no tenderness. There is no rebound.  Musculoskeletal: He exhibits edema.  Some 1+ edema to midshin right leg  Neurological: He is alert. Coordination normal.  Skin: Skin is warm and dry.  Psychiatric: He has a normal mood and affect.   Filed Vitals:   12/28/15 0932  BP: 114/64  Pulse: 74  Temp: 98.7 F (37.1 C)    TempSrc: Oral  Resp: 16  Height: 6' (1.829 m)  Weight: 339 lb 12.8 oz (154.132 kg)  SpO2: 97%      Assessment & Plan:

## 2015-12-28 NOTE — Progress Notes (Signed)
Pre visit review using our clinic review tool, if applicable. No additional management support is needed unless otherwise documented below in the visit note. 

## 2015-12-28 NOTE — Patient Instructions (Signed)
I would recommend to work with a nutritionist to help with a diet plan to work on losing weight. If you want to do that call us and we can arrange it.   I also recommend to start swimming or doing water aerobics to help build up the strength and work up without putting pressure on your leg.   Exercising 4-5 times per week is needed to help with weight loss.   Exercising to Lose Weight Exercising can help you to lose weight. In order to lose weight through exercise, you need to do vigorous-intensity exercise. You can tell that you are exercising with vigorous intensity if you are breathing very hard and fast and cannot hold a conversation while exercising. Moderate-intensity exercise helps to maintain your current weight. You can tell that you are exercising at a moderate level if you have a higher heart rate and faster breathing, but you are still able to hold a conversation. HOW OFTEN SHOULD I EXERCISE? Choose an activity that you enjoy and set realistic goals. Your health care provider can help you to make an activity plan that works for you. Exercise regularly as directed by your health care provider. This may include:  Doing resistance training twice each week, such as:  Push-ups.  Sit-ups.  Lifting weights.  Using resistance bands.  Doing a given intensity of exercise for a given amount of time. Choose from these options:  150 minutes of moderate-intensity exercise every week.  75 minutes of vigorous-intensity exercise every week.  A mix of moderate-intensity and vigorous-intensity exercise every week. Children, pregnant women, people who are out of shape, people who are overweight, and older adults may need to consult a health care provider for individual recommendations. If you have any sort of medical condition, be sure to consult your health care provider before starting a new exercise program. WHAT ARE SOME ACTIVITIES THAT CAN HELP ME TO LOSE WEIGHT?   Walking at a rate of  at least 4.5 miles an hour.  Jogging or running at a rate of 5 miles per hour.  Biking at a rate of at least 10 miles per hour.  Lap swimming.  Roller-skating or in-line skating.  Cross-country skiing.  Vigorous competitive sports, such as football, basketball, and soccer.  Jumping rope.  Aerobic dancing. HOW CAN I BE MORE ACTIVE IN MY DAY-TO-DAY ACTIVITIES?  Use the stairs instead of the elevator.  Take a walk during your lunch break.  If you drive, park your car farther away from work or school.  If you take public transportation, get off one stop early and walk the rest of the way.  Make all of your phone calls while standing up and walking around.  Get up, stretch, and walk around every 30 minutes throughout the day. WHAT GUIDELINES SHOULD I FOLLOW WHILE EXERCISING?  Do not exercise so much that you hurt yourself, feel dizzy, or get very short of breath.  Consult your health care provider prior to starting a new exercise program.  Wear comfortable clothes and shoes with good support.  Drink plenty of water while you exercise to prevent dehydration or heat stroke. Body water is lost during exercise and must be replaced.  Work out until you breathe faster and your heart beats faster.   This information is not intended to replace advice given to you by your health care provider. Make sure you discuss any questions you have with your health care provider.   Document Released: 11/11/2010 Document Revised: 10/30/2014 Document Reviewed:  03/12/2014 Elsevier Interactive Patient Education [employer].

## 2015-12-28 NOTE — Assessment & Plan Note (Signed)
He is not interested in working with a nutritionist. Does not have great diet and thinks maybe he eats too much. Does not have much interest in exercise although he has silver sneakers and can use a gym with pool. I explained multiple times that this would take stress off his joints and still provide some cardiac exercise. He will think about it.

## 2015-12-28 NOTE — Assessment & Plan Note (Signed)
Following with vascular surgery and has had dental extractions in case surgery is needed in the future.

## 2015-12-28 NOTE — Patient Instructions (Signed)
Instructions for Denture Use and Care  Congratulations, you are on the way to oral rehabilitation!  You have just received a new set of complete or partial dentures.  These prostheses will help to improve both your appearance and chewing ability.  These instructions will help you get adjusted to your dentures as well as care for them properly.  Please read these instructions carefully and completely as soon as you get home.  If you or your caregiver have any questions please notify the [facility] at [phone].  HOW YOUR DENTURES LOOK AND FEEL Soon after you begin wearing your dentures, you may feel that your dentures are too large or even loose.  As our mouth and facial muscles become accustomed to the dentures, these feelings will go away.  You also may feel that you are salivating more than you normally do.  This feeling should go away as you get used to having the dentures in your mouth.  You may bite your cheek or your tongue; this will eventually resolve itself as you wear your dentures.  Some soreness is to be expected, but you should not hurt.  If your mouth hurts, call your dentist.  A denture adhesive may occasionally be necessary to hold your dentures in place more securely.  The dentist will let you know when one is recommended for you.  SPEAKING Wearing dentures will change the sound of your voice initially.  This will be noticed by you more than anyone else.  Bite and swallow before you speak, in order to place your dentures in position so that you may speak more clearly.  Practice speaking by reading aloud or counting from 1 to 100 very slowly and distinctly.  After some practice your mouth will become accustomed to your dentures and you will speak more clearly.  EATING Chewing will definitely be different after you receive your dentures.  With a little practice and patience you should be able to eat just about any kind of food.  Begin by eating small quantities of food  that are cut into small pieces.  Star with soft foods such as eggs, cooked vegetables, or puddings.  As you gain confidence advance  Your diet to whatever texture foods you can tolerate.  DENTURE CARE Dentures can collect plaque and calculus much the same as natural teeth can.  If not removed on a regular basis, your dentures will not look or feel clean, and you will experience denture odor.  It is very important that you remove your dentures at bedtime and clean them thoroughly.  You should: 1. Clean your dentures over a sink full of water so if dropped, breakage will be prevented. 2. Rinse your dentures with cool water to remove any large food particles. 3. Use soap and water or a denture cleanser or paste to clean the dentures.  Do not use regular toothpaste as it may abrade the denture base or teeth. 4. Use a moistened denture brush to clean all surfaces (inside and outside). 5. Rinse thoroughly to remove any remaining soap or denture cleanser. 6. Use a soft bristle toothbrush to gently brush any natural teeth, gums, tongue, and palate at bedtime and before reinserting your dentures. 7. Do not sleep with your dentures in your mouth at night.  Remove your dentures and soak them overnight in a denture cup filled with water or denture solution as recommended by your dentist.  This routine will become second nature and will increase the life and comfort   of your dentures.  Please do not try to adjust these dentures yourself; you could damage them.  FOLLOW-UP You should call or make an appointment with your dentist.  Your dentist would like to see you at least once a year for a check-up and examination. 

## 2015-12-28 NOTE — Assessment & Plan Note (Signed)
Taking losartan/hctz and BP at goal, BMP after starting without indication for change. No side effects.

## 2016-01-03 ENCOUNTER — Encounter (HOSPITAL_COMMUNITY): Payer: Self-pay | Admitting: Dentistry

## 2016-01-03 ENCOUNTER — Ambulatory Visit (HOSPITAL_COMMUNITY): Payer: Self-pay | Admitting: Dentistry

## 2016-01-03 VITALS — BP 144/69 | HR 75 | Temp 97.7°F

## 2016-01-03 DIAGNOSIS — I712 Thoracic aortic aneurysm, without rupture: Secondary | ICD-10-CM

## 2016-01-03 DIAGNOSIS — K062 Gingival and edentulous alveolar ridge lesions associated with trauma: Secondary | ICD-10-CM

## 2016-01-03 DIAGNOSIS — Z972 Presence of dental prosthetic device (complete) (partial): Secondary | ICD-10-CM

## 2016-01-03 DIAGNOSIS — I7121 Aneurysm of the ascending aorta, without rupture: Secondary | ICD-10-CM

## 2016-01-03 DIAGNOSIS — Z463 Encounter for fitting and adjustment of dental prosthetic device: Secondary | ICD-10-CM

## 2016-01-03 DIAGNOSIS — K08109 Complete loss of teeth, unspecified cause, unspecified class: Secondary | ICD-10-CM

## 2016-01-03 NOTE — Patient Instructions (Signed)
Patient to keep dentures out if sore spots develop. Use salt water rinses as needed to aid healing. Return to clinic as scheduled for denture adjustment.   Call if problems arise before then.  Ronald F. Kulinski, DDS  

## 2016-01-03 NOTE — Progress Notes (Signed)
01/03/2016  Patient Name:   Ronald Harding Date of Birth:   04/18/1956 Medical Record Number: [id number]  BP 144/69 mmHg  Pulse 75  Temp(Src) 97.7 F (36.5 C) (Oral)  Ronald Harding presents for evaluation of upper and lower complete dentures. SUBJECTIVE: Patient is complaining of some irritation to the upper right and left posterior areas on the soft palate. OBJECTIVE: There is some denture irritation to the upper left and right posterior soft palate areas past denture border extension. Procedure: Pressure indicating paste was applied to the dentures. Adjustments were made as needed. [place]. 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 Thick pressure indicating paste was applied to the dentures and adjustments made as needed. [place]. Patient accepts results. Patient to keep dentures out if sore spots develop. Use salt water rinses as needed to aid healing. Return to clinic as scheduled for denture adjustment.   Call if problems arise before then.  Charlynne Panderonald F. Kulinski, DDS

## 2016-01-10 ENCOUNTER — Encounter (HOSPITAL_COMMUNITY): Payer: Self-pay | Admitting: Dentistry

## 2016-01-10 ENCOUNTER — Ambulatory Visit (HOSPITAL_COMMUNITY): Payer: Self-pay | Admitting: Dentistry

## 2016-01-10 VITALS — BP 144/70 | HR 66 | Temp 98.0°F

## 2016-01-10 DIAGNOSIS — I712 Thoracic aortic aneurysm, without rupture: Secondary | ICD-10-CM

## 2016-01-10 DIAGNOSIS — K062 Gingival and edentulous alveolar ridge lesions associated with trauma: Secondary | ICD-10-CM

## 2016-01-10 DIAGNOSIS — K08109 Complete loss of teeth, unspecified cause, unspecified class: Secondary | ICD-10-CM

## 2016-01-10 DIAGNOSIS — Z463 Encounter for fitting and adjustment of dental prosthetic device: Secondary | ICD-10-CM

## 2016-01-10 DIAGNOSIS — I7121 Aneurysm of the ascending aorta, without rupture: Secondary | ICD-10-CM

## 2016-01-10 DIAGNOSIS — Z972 Presence of dental prosthetic device (complete) (partial): Secondary | ICD-10-CM

## 2016-01-10 NOTE — Progress Notes (Signed)
01/10/2016  Patient Name:   [name] Queryorris R Tamer Date of Birth:   01/20/1956 Medical Record Number: [id number]  BP 144/70 mmHg  Pulse 66  Temp(Src) 98 F (36.7 C) (Oral)  [name] QueryNorris R Kazee presents for evaluation of upper and lower complete dentures. SUBJECTIVE: Patient has NO complaints about his dentures. OBJECTIVE: There is no denture irritation noted. The previous denture irritation to the upper left and right posterior soft palate areas past denture border extension have resolved. Procedure: Pressure indicating paste was applied to the dentures. Minimal adjustments were made as needed. [place]. 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 Patient accepts results. Patient to keep dentures out if sore spots develop. Use salt water rinses as needed to aid healing. Return to clinic as scheduled for denture adjustment.   Call if problems arise before then.  Charlynne Panderonald F. Kulinski, DDS

## 2016-01-10 NOTE — Patient Instructions (Signed)
Patient to keep dentures out if sore spots develop. Use salt water rinses as needed to aid healing. Return to clinic as scheduled for denture adjustment.   Call if problems arise before then.  Ronald F. Kulinski, DDS  

## 2016-01-11 ENCOUNTER — Encounter (HOSPITAL_COMMUNITY): Payer: Self-pay | Admitting: Dentistry

## 2016-01-25 DIAGNOSIS — H34831 Tributary (branch) retinal vein occlusion, right eye, with macular edema: Secondary | ICD-10-CM | POA: Diagnosis not present

## 2016-02-08 ENCOUNTER — Encounter (INDEPENDENT_AMBULATORY_CARE_PROVIDER_SITE_OTHER): Payer: Self-pay

## 2016-02-08 ENCOUNTER — Encounter (HOSPITAL_COMMUNITY): Payer: Self-pay | Admitting: Dentistry

## 2016-02-08 ENCOUNTER — Ambulatory Visit (HOSPITAL_COMMUNITY): Payer: Self-pay | Admitting: Dentistry

## 2016-02-08 VITALS — BP 151/70 | HR 63 | Temp 97.7°F

## 2016-02-08 DIAGNOSIS — K08109 Complete loss of teeth, unspecified cause, unspecified class: Secondary | ICD-10-CM

## 2016-02-08 DIAGNOSIS — Z463 Encounter for fitting and adjustment of dental prosthetic device: Secondary | ICD-10-CM

## 2016-02-08 DIAGNOSIS — I7121 Aneurysm of the ascending aorta, without rupture: Secondary | ICD-10-CM

## 2016-02-08 DIAGNOSIS — K0820 Unspecified atrophy of edentulous alveolar ridge: Secondary | ICD-10-CM

## 2016-02-08 DIAGNOSIS — I712 Thoracic aortic aneurysm, without rupture: Secondary | ICD-10-CM

## 2016-02-08 DIAGNOSIS — Z972 Presence of dental prosthetic device (complete) (partial): Secondary | ICD-10-CM

## 2016-02-08 NOTE — Patient Instructions (Signed)
Patient to keep dentures out if sore spots develop. Use salt water rinses as needed to aid healing. Return to clinic as scheduled for denture adjustment.   Call if problems arise before then.  Garnell Phenix F. Carolyn Maniscalco, DDS  

## 2016-02-08 NOTE — Progress Notes (Signed)
02/08/2016  Patient Name:   Ronald Harding R Monreal Date of Birth:   06/28/1956 Medical Record Number: [id number]  BP 151/70 mmHg  Pulse 63  Temp(Src) 97.7 F (36.5 C) (Oral)  Ronald QueryNorris R Voytko presents for evaluation of upper and lower complete dentures. Patient indicates he is scheduled for follow-up with Dr. Donata ClayVan Trigt in about one month for discussion of aneurysm repair at that time. SUBJECTIVE: Patient denies having any denture irritation. Patient asked about removing mid palatal portion of upper complete denture. Discussed loss of retention and stability with that procedure. Discussed use of implant therapy that the patient adamantly refused.   OBJECTIVE: There is no denture irritation noted.  Procedure: Pressure indicating paste was applied to the dentures. Minimal adjustments were made as needed. [place]. 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 The patient was instructed on finding maximum intercuspation position. Patient accepts results. Patient to keep dentures out if sore spots develop. Use salt water rinses as needed to aid healing. Return to clinic as scheduled for denture adjustment.   Call if problems arise before then.  Charlynne Panderonald F. Kulinski, DDS

## 2016-02-11 DIAGNOSIS — H34831 Tributary (branch) retinal vein occlusion, right eye, with macular edema: Secondary | ICD-10-CM | POA: Diagnosis not present

## 2016-02-14 ENCOUNTER — Encounter (HOSPITAL_COMMUNITY): Payer: Self-pay | Admitting: Dentistry

## 2016-03-08 ENCOUNTER — Other Ambulatory Visit: Payer: Self-pay | Admitting: *Deleted

## 2016-03-08 DIAGNOSIS — R030 Elevated blood-pressure reading, without diagnosis of hypertension: Secondary | ICD-10-CM

## 2016-03-08 DIAGNOSIS — I712 Thoracic aortic aneurysm, without rupture: Secondary | ICD-10-CM

## 2016-03-08 DIAGNOSIS — I7121 Aneurysm of the ascending aorta, without rupture: Secondary | ICD-10-CM

## 2016-03-08 MED ORDER — LOSARTAN POTASSIUM-HCTZ 50-12.5 MG PO TABS: 1.0000 | ORAL_TABLET | Freq: Every day | ORAL | Status: DC

## 2016-04-18 ENCOUNTER — Other Ambulatory Visit: Payer: Self-pay | Admitting: *Deleted

## 2016-04-18 DIAGNOSIS — I712 Thoracic aortic aneurysm, without rupture, unspecified: Secondary | ICD-10-CM

## 2016-05-04 DIAGNOSIS — H40013 Open angle with borderline findings, low risk, bilateral: Secondary | ICD-10-CM | POA: Diagnosis not present

## 2016-05-04 DIAGNOSIS — H2513 Age-related nuclear cataract, bilateral: Secondary | ICD-10-CM | POA: Diagnosis not present

## 2016-05-04 DIAGNOSIS — H35351 Cystoid macular degeneration, right eye: Secondary | ICD-10-CM | POA: Diagnosis not present

## 2016-05-08 ENCOUNTER — Encounter (HOSPITAL_COMMUNITY): Payer: Self-pay | Admitting: Dentistry

## 2016-05-10 ENCOUNTER — Encounter: Payer: Self-pay | Admitting: Cardiothoracic Surgery

## 2016-05-10 ENCOUNTER — Other Ambulatory Visit: Payer: Self-pay

## 2016-06-06 DIAGNOSIS — H34831 Tributary (branch) retinal vein occlusion, right eye, with macular edema: Secondary | ICD-10-CM | POA: Diagnosis not present

## 2016-06-07 ENCOUNTER — Ambulatory Visit (INDEPENDENT_AMBULATORY_CARE_PROVIDER_SITE_OTHER): Payer: Medicare HMO | Admitting: Cardiothoracic Surgery

## 2016-06-07 ENCOUNTER — Ambulatory Visit
Admission: RE | Admit: 2016-06-07 | Discharge: 2016-06-07 | Disposition: A | Discharge status: Home / Self Care | Payer: Medicare HMO | Source: Ambulatory Visit | Attending: Cardiothoracic Surgery | Admitting: Cardiothoracic Surgery

## 2016-06-07 ENCOUNTER — Encounter: Payer: Self-pay | Admitting: Cardiothoracic Surgery

## 2016-06-07 VITALS — BP 153/90 | HR 60 | Resp 16 | Ht 72.0 in | Wt 345.6 lb

## 2016-06-07 DIAGNOSIS — R599 Enlarged lymph nodes, unspecified: Secondary | ICD-10-CM | POA: Diagnosis not present

## 2016-06-07 DIAGNOSIS — I7121 Aneurysm of the ascending aorta, without rupture: Secondary | ICD-10-CM

## 2016-06-07 DIAGNOSIS — I712 Thoracic aortic aneurysm, without rupture, unspecified: Secondary | ICD-10-CM

## 2016-06-07 DIAGNOSIS — R590 Localized enlarged lymph nodes: Secondary | ICD-10-CM

## 2016-06-07 MED ORDER — IOPAMIDOL (ISOVUE-370) INJECTION 76%
75.0000 mL | Freq: Once | INTRAVENOUS | Status: AC | PRN
  Administered 2016-06-07: 75 mL via INTRAVENOUS

## 2016-06-07 NOTE — Progress Notes (Signed)
PCP is Myrlene Broker, MD Referring Provider is Pecola Lawless, MD  Chief Complaint  Patient presents with  . TAA    6 month f/u with CTA CHEST for surveillance  . 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 He returns now with a surveillance CT scan. He has been asymptomatic. He takes losartan HCTZ to control blood pressure./CT scan also showed some mild-moderate mediastinal adenopathy.  Today's CT scan shows no sign of change in the mild ascending aortic dilatation remaining at 4.3 cm. Mediastinal mild-moderate adenopathy stable and does not appear to be at risk.   The patient follow recommendation for dental extractions that was completed successfully  un the dental clinic. He now has full dental plates.   Past Medical History:  Diagnosis Date  . Aortic aneurysm (HCC)    thoracic ascending  . Asthma    hx of-as a child  . History of colon polyps    benign  . Hypertension    takes Hyzaar daily  . Joint pain   . Pneumonia    hx of-in the 70's    Past Surgical History:  Procedure Laterality Date  . ANKLE SURGERY Right 2012  . COLONOSCOPY    . MULTIPLE EXTRACTIONS WITH ALVEOLOPLASTY N/A 09/23/2015   Procedure: Extraction of tooth #'s 1-12,14- 28, 30-32 with alveoloplasty, bilateral mandibular tori reductions and reduction of lateral exostoses.;  Surgeon: Charlynne Pander, DDS;  Location: [facility] OR;  Service: Oral Surgery;  Laterality: N/A;  . NOSE SURGERY  2013    Family History  Problem Relation Age of Onset  . Hypertension Mother   . Arthritis Mother   . Hypertension Father   . Heart disease Father   . Arthritis Father     Social History Social History  Substance Use Topics  . Smoking status: Former Games developer  . Smokeless tobacco: Current User     Comment: one pack of chew per week  . Alcohol use No    Current Outpatient Prescriptions   Medication Sig Dispense Refill  . aspirin 81 MG tablet Take 81 mg by mouth daily.    Marland Kitchen losartan-hydrochlorothiazide (HYZAAR) 50-12.5 MG tablet Take 1 tablet by mouth daily. (Patient not taking: Reported on 06/07/2016) 90 tablet 1   No current facility-administered medications for this visit.     Allergies  Allergen Reactions  . Betadine [Povidone Iodine] Hives    Pt states in 2012 in [place] he fell and broke several bones. Betadine broke him out.     Review of Systems        Review of Systems :  [ y ] = yes, [  ] = no        General :  Weight gain [   ]    Weight loss  [   ]  Fatigue [  ]  Fever [  ]  Chills  [  ]                                Weakness  [  ]           HEENT    Headache [  ]  Dizziness [  ]  Blurred vision [  ] Glaucoma  [  ]  Nosebleeds [  ] Painful or loose teeth [  ]        Cardiac :  Chest pain/ pressure [  ]  Resting SOB [  ] exertional SOB [  ]                        Orthopnea [  ]  Pedal edema  [  ]  Palpitations [  ] Syncope/presyncope [ ]                         Paroxysmal nocturnal dyspnea [  ]         Pulmonary : cough [  ]  wheezing [  ]  Hemoptysis [  ] Sputum [  ] Snoring [  ]                              Pneumothorax [  ]  Sleep apnea [  ]        GI : Vomiting [  ]  Dysphagia [  ]  Melena  [  ]  Abdominal pain [  ] BRBPR [  ]              Heart burn [  ]  Constipation [  ] Diarrhea  [  ] Colonoscopy [   ]        GU : Hematuria [  ]  Dysuria [  ]  Nocturia [  ] UTI's [  ]        Vascular : Claudication [  ]  Rest pain [  ]  DVT [  ] Vein stripping [  ] leg ulcers [  ]                          TIA [  ] Stroke [  ]  Varicose veins [  ]        NEURO :  Headaches  [  ] Seizures [  ] Vision changes [  ] Paresthesias [  ]                                       Seizures [  ]        Musculoskeletal :  Arthritis [  ] Gout  [  ]  Back pain [  ]  Joint pain [  ]                      Chronic right ankle and foot swelling status  post right foot surgery for trauma 3 years ago       Skin :  Rash [  ]  Melanoma [  ] Sores [  ]        Heme : Bleeding problems [  ]Clotting Disorders [  ] Anemia [  ]Blood Transfusion [ ]         Endocrine : Diabetes [  ] Heat or Cold intolerance [  ] Polyuria [  ]excessive thirst [ ]         Psych : Depression [  ]  Anxiety [  ]  Psych hospitalizations [  ] Memory change [  ]  BP (!) 153/90   Pulse 60   Resp 16   Ht 6' (1.829 m)   Wt (!) 345 lb 9.6 oz (156.8 kg)   SpO2 96% Comment: ON RA  BMI 46.87 kg/m  Physical Exam      Physical Exam  General: Morbidly obese male no distress HEENT: Normocephalic pupils equal , dentition total plates Neck: Supple without JVD, adenopathy, or bruit Chest: Clear to auscultation, symmetrical breath sounds, no rhonchi, no tenderness             or deformity Cardiovascular: Regular rate and rhythm, no murmur, no gallop, peripheral pulses             palpable in all extremities Abdomen: Obese, Soft, nontender, no palpable mass or organomegaly Extremities: Warm, well-perfused, no clubbing cyanosisor tenderness, 2+1 right foot and ankle              no venous stasis changes of the legs Rectal/GU: Deferred Neuro: Grossly non--focal and symmetrical throughout Skin: Clean and dry without rash or ulceration   Diagnostic Tests: CT scan of the thoracic aorta personally reviewed and counseled with patient.   ascending aortic diameter maze 4.3 cm which represents a minimal aortic dilatation. Mediastinal adenopathy is also unremarkable and stable since last x-ray with low risk for malignancy  Impression: Small ascending fusiform aneurysm should be well controlled with blood pressure control weight loss and appropriate heart healthy diet. These issues were carefully explained to the patient. Current risk for aortic dissection is very small less than 1%.   Plan:Continue with surveillance CT scans. Continue  with blood pressure control and heart healthy lifestyle has been recommended. Repeat surveillance CT scan September 2018   Mikey Bussing, MD [facility] [phone]

## 2016-06-30 ENCOUNTER — Encounter: Payer: Self-pay | Admitting: Internal Medicine

## 2016-06-30 ENCOUNTER — Ambulatory Visit (INDEPENDENT_AMBULATORY_CARE_PROVIDER_SITE_OTHER): Payer: Medicare HMO | Admitting: Internal Medicine

## 2016-06-30 ENCOUNTER — Other Ambulatory Visit (INDEPENDENT_AMBULATORY_CARE_PROVIDER_SITE_OTHER): Payer: Medicare HMO

## 2016-06-30 VITALS — BP 128/80 | HR 62 | Temp 98.3°F | Resp 18 | Ht 72.0 in | Wt 351.0 lb

## 2016-06-30 DIAGNOSIS — I10 Essential (primary) hypertension: Secondary | ICD-10-CM | POA: Diagnosis not present

## 2016-06-30 DIAGNOSIS — Z0000 Encounter for general adult medical examination without abnormal findings: Secondary | ICD-10-CM | POA: Insufficient documentation

## 2016-06-30 LAB — COMPREHENSIVE METABOLIC PANEL WITH GFR
ALT: 20 U/L (ref 0–53)
AST: 21 U/L (ref 0–37)
Albumin: 3.7 g/dL (ref 3.5–5.2)
Alkaline Phosphatase: 100 U/L (ref 39–117)
BUN: 10 mg/dL (ref 6–23)
CO2: 27 meq/L (ref 19–32)
Calcium: 8.5 mg/dL (ref 8.4–10.5)
Chloride: 105 meq/L (ref 96–112)
Creatinine, Ser: 0.94 mg/dL (ref 0.40–1.50)
GFR: 105.28 mL/min
Glucose, Bld: 85 mg/dL (ref 70–99)
Potassium: 4.1 meq/L (ref 3.5–5.1)
Sodium: 138 meq/L (ref 135–145)
Total Bilirubin: 0.4 mg/dL (ref 0.2–1.2)
Total Protein: 7.1 g/dL (ref 6.0–8.3)

## 2016-06-30 LAB — LIPID PANEL
Cholesterol: 130 mg/dL (ref 0–200)
HDL: 27.2 mg/dL — ABNORMAL LOW
LDL Cholesterol: 70 mg/dL (ref 0–99)
NonHDL: 102.38
Total CHOL/HDL Ratio: 5
Triglycerides: 160 mg/dL — ABNORMAL HIGH (ref 0.0–149.0)
VLDL: 32 mg/dL (ref 0.0–40.0)

## 2016-06-30 LAB — HEMOGLOBIN A1C: Hgb A1c MFr Bld: 6.2 % (ref 4.6–6.5)

## 2016-06-30 NOTE — Assessment & Plan Note (Signed)
Checking lipid panel and HgA1c, weight is up from last year. He is not exercising at all and reminded him of the need to take pressure off his joints.

## 2016-06-30 NOTE — Assessment & Plan Note (Signed)
Checking labs, declines hep c screening. Declines any immunizations that are due. Needs to start exercising and counseled him about his weight as his biggest health risk. Counseled about sun safety and mole surveillance. Given 10 year screening recommendations.

## 2016-06-30 NOTE — Patient Instructions (Signed)
We are checking the labs today and will call you back with the results.  You need to start doing some exercise to help keep the body in shape.   Health Maintenance, Male A healthy lifestyle and preventative care can promote health and wellness.  Maintain regular health, dental, and eye exams.  Eat a healthy diet. Foods like vegetables, fruits, whole grains, low-fat dairy products, and lean protein foods contain the nutrients you need and are low in calories. Decrease your intake of foods high in solid fats, added sugars, and salt. Get information about a proper diet from your health care provider, if necessary.  Regular physical exercise is one of the most important things you can do for your health. Most adults should get at least 150 minutes of moderate-intensity exercise (any activity that increases your heart rate and causes you to sweat) each week. In addition, most adults need muscle-strengthening exercises on 2 or more days a week.   Maintain a healthy weight. The body mass index (BMI) is a screening tool to identify possible weight problems. It provides an estimate of body fat based on height and weight. Your health care provider can find your BMI and can help you achieve or maintain a healthy weight. For males 20 years and older:  A BMI below 18.5 is considered underweight.  A BMI of 18.5 to 24.9 is normal.  A BMI of 25 to 29.9 is considered overweight.  A BMI of 30 and above is considered obese.  Maintain normal blood lipids and cholesterol by exercising and minimizing your intake of saturated fat. Eat a balanced diet with plenty of fruits and vegetables. Blood tests for lipids and cholesterol should begin at age 60 and be repeated every 5 years. If your lipid or cholesterol levels are high, you are over age 60, or you are at high risk for heart disease, you may need your cholesterol levels checked more frequently.Ongoing high lipid and cholesterol levels should be treated with  medicines if diet and exercise are not working.  If you smoke, find out from your health care provider how to quit. If you do not use tobacco, do not start.  Lung cancer screening is recommended for adults aged 55-80 years who are at high risk for developing lung cancer because of a history of smoking. A yearly low-dose CT scan of the lungs is recommended for people who have at least a 30-pack-year history of smoking and are current smokers or have quit within the past 15 years. A pack year of smoking is smoking an average of 1 pack of cigarettes a day for 1 year (for example, a 30-pack-year history of smoking could mean smoking 1 pack a day for 30 years or 2 packs a day for 15 years). Yearly screening should continue until the smoker has stopped smoking for at least 15 years. Yearly screening should be stopped for people who develop a health problem that would prevent them from having lung cancer treatment.  If you choose to drink alcohol, do not have more than 2 drinks per day. One drink is considered to be 12 oz (360 mL) of beer, 5 oz (150 mL) of wine, or 1.5 oz (45 mL) of liquor.  Avoid the use of street drugs. Do not share needles with anyone. Ask for help if you need support or instructions about stopping the use of drugs.  High blood pressure causes heart disease and increases the risk of stroke. High blood pressure is more likely to develop  in:  People who have blood pressure in the end of the normal range (100-139/85-89 mm Hg).  People who are overweight or obese.  People who are African American.  If you are 76-70 years of age, have your blood pressure checked every 3-5 years. If you are 36 years of age or older, have your blood pressure checked every year. You should have your blood pressure measured twice--once when you are at a hospital or clinic, and once when you are not at a hospital or clinic. Record the average of the two measurements. To check your blood pressure when you are not  at a hospital or clinic, you can use:  An automated blood pressure machine at a pharmacy.  A home blood pressure monitor.  If you are 52-23 years old, ask your health care provider if you should take aspirin to prevent heart disease.  Diabetes screening involves taking a blood sample to check your fasting blood sugar level. This should be done once every 3 years after age 74 if you are at a normal weight and without risk factors for diabetes. Testing should be considered at a younger age or be carried out more frequently if you are overweight and have at least 1 risk factor for diabetes.  Colorectal cancer can be detected and often prevented. Most routine colorectal cancer screening begins at the age of 26 and continues through age 88. However, your health care provider may recommend screening at an earlier age if you have risk factors for colon cancer. On a yearly basis, your health care provider may provide home test kits to check for hidden blood in the stool. A small camera at the end of a tube may be used to directly examine the colon (sigmoidoscopy or colonoscopy) to detect the earliest forms of colorectal cancer. Talk to your health care provider about this at age 5 when routine screening begins. A direct exam of the colon should be repeated every 5-10 years through age 75, unless early forms of precancerous polyps or small growths are found.  People who are at an increased risk for hepatitis B should be screened for this virus. You are considered at high risk for hepatitis B if:  You were born in a country where hepatitis B occurs often. Talk with your health care provider about which countries are considered high risk.  Your parents were born in a high-risk country and you have not received a shot to protect against hepatitis B (hepatitis B vaccine).  You have HIV or AIDS.  You use needles to inject street drugs.  You live with, or have sex with, someone who has hepatitis B.  You  are a man who has sex with other men (MSM).  You get hemodialysis treatment.  You take certain medicines for conditions like cancer, organ transplantation, and autoimmune conditions.  Hepatitis C blood testing is recommended for all people born from [birth year] through 1965 and any individual with known risk factors for hepatitis C.  Healthy men should no longer receive prostate-specific antigen (PSA) blood tests as part of routine cancer screening. Talk to your health care provider about prostate cancer screening.  Testicular cancer screening is not recommended for adolescents or adult males who have no symptoms. Screening includes self-exam, a health care provider exam, and other screening tests. Consult with your health care provider about any symptoms you have or any concerns you have about testicular cancer.  Practice safe sex. Use condoms and avoid high-risk sexual practices to reduce the  spread of sexually transmitted infections (STIs).  You should be screened for STIs, including gonorrhea and chlamydia if:  You are sexually active and are younger than 24 years.  You are older than 24 years, and your health care provider tells you that you are at risk for this type of infection.  Your sexual activity has changed since you were last screened, and you are at an increased risk for chlamydia or gonorrhea. Ask your health care provider if you are at risk.  If you are at risk of being infected with HIV, it is recommended that you take a prescription medicine daily to prevent HIV infection. This is called pre-exposure prophylaxis (PrEP). You are considered at risk if:  You are a man who has sex with other men (MSM).  You are a heterosexual man who is sexually active with multiple partners.  You take drugs by injection.  You are sexually active with a partner who has HIV.  Talk with your health care provider about whether you are at high risk of being infected with HIV. If you choose to begin  PrEP, you should first be tested for HIV. You should then be tested every 3 months for as long as you are taking PrEP.  Use sunscreen. Apply sunscreen liberally and repeatedly throughout the day. You should seek shade when your shadow is shorter than you. Protect yourself by wearing long sleeves, pants, a wide-brimmed hat, and sunglasses year round whenever you are outdoors.  Tell your health care provider of new moles or changes in moles, especially if there is a change in shape or color. Also, tell your health care provider if a mole is larger than the size of a pencil eraser.  A one-time screening for abdominal aortic aneurysm (AAA) and surgical repair of large AAAs by ultrasound is recommended for men aged 41-75 years who are current or former smokers.  Stay current with your vaccines (immunizations).   This information is not intended to replace advice given to you by your health care provider. Make sure you discuss any questions you have with your health care provider.   Document Released: 04/06/2008 Document Revised: 10/30/2014 Document Reviewed: 03/06/2011 Elsevier Interactive Patient Education [employer].

## 2016-06-30 NOTE — Assessment & Plan Note (Signed)
BP at goal on losartan/hctz and no side effects. Checking CMP and adjust as needed.

## 2016-06-30 NOTE — Progress Notes (Signed)
   Subjective:    Patient ID: Ronald Harding, male    DOB: 02/25/1956, 60 y.o.   MRN: [id number]  HPI Here for medicare wellness and CPE, no new complaints. Please see A/P for status and treatment of chronic medical problems.   Diet: average Physical activity: sedentary Depression/mood screen: negative Hearing: intact to whispered voice Visual acuity: grossly normal with lens, some blurring right eye, performs annual eye exam  ADLs: capable Fall risk: none Home safety: good Cognitive evaluation: intact to orientation, naming, recall and repetition EOL planning: adv directives discussed  I have personally reviewed and have noted 1. The patient's medical and social history - reviewed today no changes 2. Their use of alcohol, tobacco or illicit drugs 3. Their current medications and supplements 4. The patient's functional ability including ADL's, fall risks, home safety risks and hearing or visual impairment. 5. Diet and physical activities 6. Evidence for depression or mood disorders 7. Care team reviewed and updated (available in snapshot)  Review of Systems  Constitutional: Negative for activity change, appetite change, fatigue, fever and unexpected weight change.  HENT: Negative for ear discharge, ear pain, sinus pressure, sore throat and trouble swallowing.   Eyes: Negative.   Respiratory: Negative for cough, chest tightness, shortness of breath and wheezing.   Cardiovascular: Negative for chest pain, palpitations and leg swelling.  Gastrointestinal: Negative for abdominal distention, abdominal pain, constipation, diarrhea and nausea.  Musculoskeletal: Negative.   Skin: Negative.   Neurological: Negative.   Psychiatric/Behavioral: Negative.       Objective:   Physical Exam  Constitutional: He appears well-developed and well-nourished.  Morbidly obese  HENT:  Head: Normocephalic and atraumatic.  Eyes: EOM are normal.  Neck: Normal range of motion.  Cardiovascular:  Normal rate and regular rhythm.   Pulmonary/Chest: Effort normal and breath sounds normal. No respiratory distress. He has no wheezes. He has no rales.  Abdominal: Soft. Bowel sounds are normal. He exhibits no distension. There is no tenderness. There is no rebound.  Musculoskeletal: He exhibits edema.  Some 1+ edema to midshin right leg  Neurological: He is alert. Coordination normal.  Skin: Skin is warm and dry.  Psychiatric: He has a normal mood and affect.   Vitals:   06/30/16 0801  BP: 128/80  Pulse: 62  Resp: 18  Temp: 98.3 F (36.8 C)  TempSrc: Oral  SpO2: 98%  Weight: (!) 351 lb (159.2 kg)  Height: 6' (1.829 m)      Assessment & Plan:

## 2016-06-30 NOTE — Progress Notes (Signed)
Pre visit review using our clinic review tool, if applicable. No additional management support is needed unless otherwise documented below in the visit note. 

## 2016-07-25 DIAGNOSIS — H348312 Tributary (branch) retinal vein occlusion, right eye, stable: Secondary | ICD-10-CM | POA: Diagnosis not present

## 2016-07-25 DIAGNOSIS — I10 Essential (primary) hypertension: Secondary | ICD-10-CM | POA: Diagnosis not present

## 2016-10-24 ENCOUNTER — Telehealth: Payer: Self-pay

## 2016-10-24 ENCOUNTER — Ambulatory Visit (INDEPENDENT_AMBULATORY_CARE_PROVIDER_SITE_OTHER): Payer: Medicare HMO

## 2016-10-24 ENCOUNTER — Ambulatory Visit (INDEPENDENT_AMBULATORY_CARE_PROVIDER_SITE_OTHER): Payer: Medicare HMO | Admitting: Family Medicine

## 2016-10-24 VITALS — BP 139/90 | HR 92 | Temp 98.6°F | Resp 16 | Ht 71.0 in | Wt 339.0 lb

## 2016-10-24 DIAGNOSIS — R112 Nausea with vomiting, unspecified: Secondary | ICD-10-CM

## 2016-10-24 DIAGNOSIS — R1084 Generalized abdominal pain: Secondary | ICD-10-CM

## 2016-10-24 DIAGNOSIS — R197 Diarrhea, unspecified: Secondary | ICD-10-CM | POA: Diagnosis not present

## 2016-10-24 DIAGNOSIS — K56609 Unspecified intestinal obstruction, unspecified as to partial versus complete obstruction: Secondary | ICD-10-CM | POA: Diagnosis not present

## 2016-10-24 LAB — BASIC METABOLIC PANEL WITH GFR
BUN/Creatinine Ratio: 15 (ref 10–24)
BUN: 20 mg/dL (ref 8–27)
CO2: 22 mmol/L (ref 18–29)
Calcium: 9.6 mg/dL (ref 8.6–10.2)
Chloride: 99 mmol/L (ref 96–106)
Creatinine, Ser: 1.31 mg/dL — ABNORMAL HIGH (ref 0.76–1.27)
GFR calc Af Amer: 68 mL/min/{1.73_m2}
GFR calc non Af Amer: 59 mL/min/{1.73_m2} — ABNORMAL LOW
Glucose: 118 mg/dL — ABNORMAL HIGH (ref 65–99)
Potassium: 4.7 mmol/L (ref 3.5–5.2)
Sodium: 141 mmol/L (ref 134–144)

## 2016-10-24 NOTE — Progress Notes (Addendum)
Subjective:  By signing my name below, I, Stann Ore, attest that this documentation has been prepared under the direction and in the presence of Meredith Staggers, MD. Electronically Signed: Stann Ore, Scribe. 10/24/2016 , 9:33 AM .  Patient was seen in Room 10 .   Patient ID: Ronald Harding, male    DOB: [birth year], 61 y.o.   MRN: [id number] Chief Complaint  Patient presents with  . Emesis    x 2 days  . Diarrhea    x 3 days  . Abdominal Pain    x 4 days   . Fever    pt says low grade 99.8   HPI Ronald Harding is a 61 y.o. male H/o HTN, and thoracic aortic aneurysm. He presents with acute illness vomiting, diarrhea and abdominal pain. Patient states he ate rotisserie chicken from [employer] 4 nights ago. After the meal, his stomach started feeling tight with hiccups and diarrhea starting into the next day. He started having vomiting 2 days ago. His last vomiting was last night at 8:00PM and diarrhea this morning. He had 2 episodes of diarrhea yesterday, and 4-6 times of vomiting yesterday. He denies blood in vomit or diarrhea. He also had a low grade fever of 99.8, nothing above 100. His family had the same meal, and his 2 daughters also had diarrhea; however, his wife doesn't have the symptoms. He denies recent antibiotics or hospitalization.   Patient informs his stomach still feels sore, similar to having a blockage with hiccups. He's been drinking gatorade and ginger ale, with a "heartburn feeling", but able to keep the fluid down. He denies urinary symptoms. He denies major surgical history of abdomen, still has appendix and gall bladder. He had a fall in 2012, and had an intestinal blockage. He was in the hospital at that time, but no issues since then.   Patient Active Problem List   Diagnosis Date Noted  . Routine general medical examination at a health care facility 06/30/2016  . Thoracic ascending aortic aneurysm (HCC) 08/20/2015  . Essential hypertension 06/30/2015  .  Morbid obesity (HCC) 06/30/2015   Past Medical History:  Diagnosis Date  . Aortic aneurysm (HCC)    thoracic ascending  . Asthma    hx of-as a child  . History of colon polyps    benign  . Hypertension    takes Hyzaar daily  . Joint pain   . Pneumonia    hx of-in the 70's   Past Surgical History:  Procedure Laterality Date  . ANKLE SURGERY Right 2012  . COLONOSCOPY    . MULTIPLE EXTRACTIONS WITH ALVEOLOPLASTY N/A 09/23/2015   Procedure: Extraction of tooth #'s 1-12,14- 28, 30-32 with alveoloplasty, bilateral mandibular tori reductions and reduction of lateral exostoses.;  Surgeon: Charlynne Pander, DDS;  Location[facility] OR;  Service: Oral Surgery;  Laterality: N/A;  . NOSE SURGERY  2013   Allergies  Allergen Reactions  . Betadine [Povidone Iodine] Hives    Pt states in 2012 in [place] he fell and broke several bones. Betadine broke him out.    Prior to Admission medications   Medication Sig Start Date End Date Taking? Authorizing Provider  aspirin 81 MG tablet Take 81 mg by mouth daily.   Yes Historical Provider, MD  losartan-hydrochlorothiazide (HYZAAR) 50-12.5 MG tablet Take 1 tablet by mouth daily. 03/08/16  Yes Myrlene Broker, MD   Social History   Social History  . Marital status: Married    Spouse name: N/A  .  Number of children: 2  . Years of education: N/A   Occupational History  . Not on file.   Social History Main Topics  . Smoking status: Former Games developer  . Smokeless tobacco: Current User     Comment: one pack of chew per week  . Alcohol use No  . Drug use: No  . Sexual activity: Not on file   Other Topics Concern  . Not on file   Social History Narrative   Married. 2 children. Disabled.   Review of Systems  Constitutional: Positive for fever. Negative for fatigue and unexpected weight change.  Eyes: Negative for visual disturbance.  Respiratory: Negative for cough, chest tightness and shortness of breath.   Cardiovascular: Negative for  chest pain, palpitations and leg swelling.  Gastrointestinal: Positive for abdominal pain, diarrhea and vomiting. Negative for blood in stool.  Neurological: Negative for dizziness, light-headedness and headaches.       Objective:   Physical Exam  Constitutional: He is oriented to person, place, and time. He appears well-developed and well-nourished. No distress.  HENT:  Head: Normocephalic and atraumatic.  Eyes: EOM are normal. Pupils are equal, round, and reactive to light.  Neck: Neck supple. No JVD present. Carotid bruit is not present.  Cardiovascular: Normal rate, regular rhythm and normal heart sounds.   No murmur heard. Pulmonary/Chest: Effort normal and breath sounds normal. No respiratory distress. He has no rales.  Abdominal: Bowel sounds are increased. There is no tenderness (non focal). There is no tenderness at McBurney's point and negative Murphy's sign.  Hyperactive bowel sounds, no high pitch bowel sounds; protruding/distended abdomen  Musculoskeletal: Normal range of motion. He exhibits no edema.  Neurological: He is alert and oriented to person, place, and time.  Skin: Skin is warm and dry.  Psychiatric: He has a normal mood and affect. His behavior is normal.  Nursing note and vitals reviewed.   Vitals:   10/24/16 0907  BP: 139/90  Pulse: 92  Resp: 16  Temp: 98.6 F (37 C)  TempSrc: Oral  SpO2: 95%  Weight: (!) 339 lb (153.8 kg)  Height: 5\' 11"  (1.803 m)   Dg Abd Acute W/chest  Result Date: 10/24/2016 CLINICAL DATA:  Nausea, vomiting, and diarrhea. EXAM: DG ABDOMEN ACUTE W/ 1V CHEST COMPARISON:  Chest x-ray dated 06/30/2015 FINDINGS: There is tortuosity of the thoracic aorta. Heart size and pulmonary vascularity are normal and the lungs are clear. There multiple dilated loops of small bowel in the mid abdomen. The colon is not distended. Upright radiograph demonstrates stairstep air-fluid levels consistent with small bowel obstruction. IMPRESSION: Small bowel  obstruction.  Benign-appearing chest. Electronically Signed   By: Francene Boyers M.D.   On: 10/24/2016 10:05   10:19 AM Discussed with general surgeon on-call. Small bowel obstruction noted on x-ray, but has tolerated some sips of fluids this morning and last night, and no vomiting today. Okay for initial trial at outpatient treatment, We'll try small sips of fluids only, check electrolytes to make sure potassium is above 3.4, not acidotic, and to evaluate creatinine to make sure he does not have signs of prerenal azotemia.   Results for orders placed or performed in visit on 10/24/16  Basic metabolic panel  Result Value Ref Range   Glucose 118 (H) 65 - 99 mg/dL   BUN 20 8 - 27 mg/dL   Creatinine, Ser 1.61 (H) 0.76 - 1.27 mg/dL   GFR calc non Af Amer 59 (L) >59 mL/min/1.73   GFR calc Af  Amer 68 >59 mL/min/1.73   BUN/Creatinine Ratio 15 10 - 24   Sodium 141 134 - 144 mmol/L   Potassium 4.7 3.5 - 5.2 mmol/L   Chloride 99 96 - 106 mmol/L   CO2 22 18 - 29 mmol/L   Calcium 9.6 8.6 - 10.2 mg/dL       Assessment & Plan:     Ronald Harding is a 61 y.o. male Nausea and vomiting, intractability of vomiting not specified, unspecified vomiting type - Plan: DG Abd Acute W/Chest  Diarrhea, unspecified type - Plan: GI Profile, Stool, PCR  Abdominal pain, generalized - Plan: DG Abd Acute W/Chest  Small bowel obstruction - Plan: Basic metabolic panel  Suspected foodborne illness versus viral illness with secondary small bowel obstruction. No vomiting since 8 PM last night, tolerating sips of fluids this morning. Discussed with general surgeon on call. Recommended initial trial of outpatient treatment with fluids only for the next few days as long as electrolytes are stable  -Stat BMP was ordered - results above. Creatinine is elevated, but just above normal. Mild AKI possible, but as above has been able to drink some fluids without vomiting today. Potassium, bicarb, and other electrolytes okay.  If vomiting recurs overnight, advised to proceed to emergency room for IV fluids and likely hospitalization.  - Gastrointestinal pathogen panel ordered. No known risk factors for Clostridium difficile.  -Follow-up with Dr. Alvy Bimler tomorrow morning for repeat evaluation.  - overnight ER precautions were discussed.   -Possible gastroenterology evaluation after resolution of current illness to evaluate for other imaging or cause for SBO.   No orders of the defined types were placed in this encounter.  Patient Instructions    Unfortunately your symptoms were likely due to either a virus or food borne illness. Most of the time both of those conditions are self limited. I will check the gastrointestinal pathogen panel to look into causes such as Salmonella, but again most of the time you do not need antibiotics to treat those infections. You also have a small bowel obstruction. Usually you need to be in the hospital on IV fluids for this, but after discussion with the general surgeon and the fact that you are tolerating some sips of fluids, we can try outpatient treatment initially. Small sips of fluids only for now, avoid any solid foods. I will check your electrolytes and let you know later this afternoon whether or not we can continue outpatient treatment. Plan on follow-up with Deliah Boston tomorrow, but if any worsening symptoms tonight including recurrent vomiting, go to the emergency room.  Return to the clinic or go to the nearest emergency room if any of your symptoms worsen or new symptoms occur.    Small Bowel Obstruction A small bowel obstruction is a blockage in the small bowel. The small bowel, which is also called the small intestine, is a long, slender tube that connects the stomach to the colon. When a person eats and drinks, food and fluids go from the stomach to the small bowel. This is where most of the nutrients in the food and fluids are absorbed. A small bowel obstruction will  prevent food and fluids from passing through the small bowel as they normally do during digestion. The small bowel can become partially or completely blocked. This can cause symptoms such as abdominal pain, vomiting, and bloating. If this condition is not treated, it can be dangerous because the small bowel could rupture. What are the causes? Common causes of this  condition include:  Scar tissue from previous surgery or radiation treatment.  Recent surgery. This may cause the movements of the bowel to slow down and cause food to block the intestine.  Hernias.  Inflammatory bowel disease (colitis).  Twisting of the bowel (volvulus).  Tumors.  A foreign body.  Slipping of a part of the bowel into another part (intussusception). What are the signs or symptoms? Symptoms of this condition include:  Abdominal pain. This may be dull cramps or sharp pain. It may occur in one area, or it may be present in the entire abdomen. Pain can range from mild to severe, depending on the degree of obstruction.  Nausea and vomiting. Vomit may be greenish or a yellow bile color.  Abdominal bloating.  Constipation.  Lack of passing gas.  Frequent belching.  Diarrhea. This may occur if the obstruction is partial and runny stool is able to leak around the obstruction. How is this diagnosed? This condition may be diagnosed based on a physical exam, medical history, and X-rays of the abdomen. You may also have other tests, such as a CT scan of the abdomen and pelvis. How is this treated? Treatment for this condition depends on the cause and severity of the problem. Treatment options may include:  Bed rest along with fluids and pain medicines that are given through an IV tube inserted into one of your veins. Sometimes, this is all that is needed for the obstruction to improve.  Following a simple diet. In some cases, a clear liquid diet may be required for several days. This allows the bowel to  rest.  Placement of a small tube (nasogastric tube) into the stomach. When the bowel is blocked, it usually swells up like a balloon that is filled with air and fluids. The air and fluids may be removed by suction through the nasogastric tube. This can help with pain, discomfort, and nausea. It can also help the obstruction to clear up faster.  Surgery. This may be required if other treatments do not work. Bowel obstruction from a hernia may require early surgery and can be an emergency procedure. Surgery may also be required for scar tissue that causes frequent or severe obstructions. Follow these instructions at home:  Get plenty of rest.  Follow instructions from your health care provider about eating restrictions. You may need to avoid solid foods and consume only clear liquids until your condition improves.  Take over-the-counter and prescription medicines only as told by your health care provider.  Keep all follow-up visits as told by your health care provider. This is important. Contact a health care provider if:  You have a fever.  You have chills. Get help right away if:  You have increased pain or cramping.  You vomit blood.  You have uncontrolled vomiting or nausea.  You cannot drink fluids because of vomiting or pain.  You develop confusion.  You begin feeling very dry or thirsty (dehydrated).  You have severe bloating.  You feel extremely weak or you faint. This information is not intended to replace advice given to you by your health care provider. Make sure you discuss any questions you have with your health care provider. Document Released: 12/26/2005 Document Revised: 06/05/2016 Document Reviewed: 12/03/2014 Elsevier Interactive Patient Education  2017 [employer]    IF you received an x-ray today, you will receive an invoice from [facility]. Please contact [facility] at [phone] with questions or concerns regarding your invoice.    IF you received  labwork today, you will receive an invoice from [employer]. Please contact [facility] at [phone] with questions or concerns regarding your invoice.   Our billing staff will not be able to assist you with questions regarding bills from these companies.  You will be contacted with the lab results as soon as they are available. The fastest way to get your results is to activate your My Chart account. Instructions are located on the last page of this paperwork. If you have not heard from [place] regarding the results in 2 weeks, please contact this office.        I personally performed the services described in this documentation, which was scribed in my presence. The recorded information has been reviewed and considered, and addended by me as needed.   Signed,   Meredith Staggers, MD [facility].  10/24/16 10:29 AM

## 2016-10-24 NOTE — Telephone Encounter (Signed)
Patient notified that no medication will be sent in for the nausea.

## 2016-10-24 NOTE — Patient Instructions (Addendum)
Unfortunately your symptoms were likely due to either a virus or food borne illness. Most of the time both of those conditions are self limited. I will check the gastrointestinal pathogen panel to look into causes such as Salmonella, but again most of the time you do not need antibiotics to treat those infections. You also have a small bowel obstruction. Usually you need to be in the hospital on IV fluids for this, but after discussion with the general surgeon and the fact that you are tolerating some sips of fluids, we can try outpatient treatment initially. Small sips of fluids only for now, avoid any solid foods. I will check your electrolytes and let you know later this afternoon whether or not we can continue outpatient treatment. Plan on follow-up with Deliah BostonMichael Clark tomorrow, but if any worsening symptoms tonight including recurrent vomiting, go to the emergency room.  Return to the clinic or go to the nearest emergency room if any of your symptoms worsen or new symptoms occur.    Small Bowel Obstruction A small bowel obstruction is a blockage in the small bowel. The small bowel, which is also called the small intestine, is a long, slender tube that connects the stomach to the colon. When a person eats and drinks, food and fluids go from the stomach to the small bowel. This is where most of the nutrients in the food and fluids are absorbed. A small bowel obstruction will prevent food and fluids from passing through the small bowel as they normally do during digestion. The small bowel can become partially or completely blocked. This can cause symptoms such as abdominal pain, vomiting, and bloating. If this condition is not treated, it can be dangerous because the small bowel could rupture. What are the causes? Common causes of this condition include:  Scar tissue from previous surgery or radiation treatment.  Recent surgery. This may cause the movements of the bowel to slow down and cause food  to block the intestine.  Hernias.  Inflammatory bowel disease (colitis).  Twisting of the bowel (volvulus).  Tumors.  A foreign body.  Slipping of a part of the bowel into another part (intussusception). What are the signs or symptoms? Symptoms of this condition include:  Abdominal pain. This may be dull cramps or sharp pain. It may occur in one area, or it may be present in the entire abdomen. Pain can range from mild to severe, depending on the degree of obstruction.  Nausea and vomiting. Vomit may be greenish or a yellow bile color.  Abdominal bloating.  Constipation.  Lack of passing gas.  Frequent belching.  Diarrhea. This may occur if the obstruction is partial and runny stool is able to leak around the obstruction. How is this diagnosed? This condition may be diagnosed based on a physical exam, medical history, and X-rays of the abdomen. You may also have other tests, such as a CT scan of the abdomen and pelvis. How is this treated? Treatment for this condition depends on the cause and severity of the problem. Treatment options may include:  Bed rest along with fluids and pain medicines that are given through an IV tube inserted into one of your veins. Sometimes, this is all that is needed for the obstruction to improve.  Following a simple diet. In some cases, a clear liquid diet may be required for several days. This allows the bowel to rest.  Placement of a small tube (nasogastric tube) into the stomach. When the bowel is blocked, it  usually swells up like a balloon that is filled with air and fluids. The air and fluids may be removed by suction through the nasogastric tube. This can help with pain, discomfort, and nausea. It can also help the obstruction to clear up faster.  Surgery. This may be required if other treatments do not work. Bowel obstruction from a hernia may require early surgery and can be an emergency procedure. Surgery may also be required for scar  tissue that causes frequent or severe obstructions. Follow these instructions at home:  Get plenty of rest.  Follow instructions from your health care provider about eating restrictions. You may need to avoid solid foods and consume only clear liquids until your condition improves.  Take over-the-counter and prescription medicines only as told by your health care provider.  Keep all follow-up visits as told by your health care provider. This is important. Contact a health care provider if:  You have a fever.  You have chills. Get help right away if:  You have increased pain or cramping.  You vomit blood.  You have uncontrolled vomiting or nausea.  You cannot drink fluids because of vomiting or pain.  You develop confusion.  You begin feeling very dry or thirsty (dehydrated).  You have severe bloating.  You feel extremely weak or you faint. This information is not intended to replace advice given to you by your health care provider. Make sure you discuss any questions you have with your health care provider. Document Released: 12/26/2005 Document Revised: 06/05/2016 Document Reviewed: 12/03/2014 Elsevier Interactive Patient Education  2017 [employer].    IF you received an x-ray today, you will receive an invoice from [facility]. Please contact [facility] at [phone] with questions or concerns regarding your invoice.   IF you received labwork today, you will receive an invoice from [facility]. Please contact [facility] at [phone] with questions or concerns regarding your invoice.   Our billing staff will not be able to assist you with questions regarding bills from these companies.  You will be contacted with the lab results as soon as they are available. The fastest way to get your results is to activate your My Chart account. Instructions are located on the last page of this paperwork. If you have not heard from [place] regarding the results in 2  weeks, please contact this office.

## 2016-10-24 NOTE — Telephone Encounter (Signed)
Pt was seen by Dr. Neva SeatGreene today 10/25/15 for nausea and vomiting. He thought he was going to get a script for nausea. Pharmacy:  [id number] - [place], [place] - [address] AT [facility] [address] Please advise at [phone]

## 2016-10-24 NOTE — Telephone Encounter (Signed)
I spoke with dr Neva Seatgreene about the plan and due to the small bowel obstruction he will not give anti nausea med to mask potential

## 2016-10-25 ENCOUNTER — Ambulatory Visit (INDEPENDENT_AMBULATORY_CARE_PROVIDER_SITE_OTHER): Payer: Medicare HMO | Admitting: Emergency Medicine

## 2016-10-25 ENCOUNTER — Telehealth: Payer: Self-pay | Admitting: Emergency Medicine

## 2016-10-25 VITALS — BP 132/82 | HR 83 | Temp 98.9°F | Resp 17 | Ht 71.0 in | Wt 336.0 lb

## 2016-10-25 DIAGNOSIS — R1084 Generalized abdominal pain: Secondary | ICD-10-CM

## 2016-10-25 DIAGNOSIS — R197 Diarrhea, unspecified: Secondary | ICD-10-CM | POA: Diagnosis not present

## 2016-10-25 DIAGNOSIS — R112 Nausea with vomiting, unspecified: Secondary | ICD-10-CM | POA: Diagnosis not present

## 2016-10-25 NOTE — Progress Notes (Addendum)
[name] Query 61 y.o.   Chief Complaint  Patient presents with  . Follow-up    nausea vomiting     HISTORY OF PRESENT ILLNESS: This is a 61 y.o. male complaining of nothing. Feels 100% better. Was seen here yesterday and x-ray read as SBO. Had diarrhea, vomiting, crampy diffuse abdominal pain that started 3-4 days ago. No longer vomiting, no diarrhea, no fever, and is hungry. Asymptomatic.  HPI   Prior to Admission medications   Medication Sig Start Date End Date Taking? Authorizing Provider  aspirin 81 MG tablet Take 81 mg by mouth daily.    Historical Provider, MD  losartan-hydrochlorothiazide (HYZAAR) 50-12.5 MG tablet Take 1 tablet by mouth daily. Patient not taking: Reported on 10/25/2016 03/08/16   Myrlene Broker, MD    Allergies  Allergen Reactions  . Betadine [Povidone Iodine] Hives    Pt states in 2012 in [place] he fell and broke several bones. Betadine broke him out.     Patient Active Problem List   Diagnosis Date Noted  . Routine general medical examination at a health care facility 06/30/2016  . Thoracic ascending aortic aneurysm (HCC) 08/20/2015  . Essential hypertension 06/30/2015  . Morbid obesity (HCC) 06/30/2015    Past Medical History:  Diagnosis Date  . Aortic aneurysm (HCC)    thoracic ascending  . Asthma    hx of-as a child  . History of colon polyps    benign  . Hypertension    takes Hyzaar daily  . Joint pain   . Pneumonia    hx of-in the 70's    Past Surgical History:  Procedure Laterality Date  . ANKLE SURGERY Right 2012  . COLONOSCOPY    . MULTIPLE EXTRACTIONS WITH ALVEOLOPLASTY N/A 09/23/2015   Procedure: Extraction of tooth #'s 1-12,14- 28, 30-32 with alveoloplasty, bilateral mandibular tori reductions and reduction of lateral exostoses.;  Surgeon: Charlynne Pander, DDS;  Location: [facility] OR;  Service: Oral Surgery;  Laterality: N/A;  . NOSE SURGERY  2013    Social History   Social History  . Marital status: Married   Spouse name: N/A  . Number of children: 2  . Years of education: N/A   Occupational History  . Not on file.   Social History Main Topics  . Smoking status: Former Games developer  . Smokeless tobacco: Current User     Comment: one pack of chew per week  . Alcohol use No  . Drug use: No  . Sexual activity: Not on file   Other Topics Concern  . Not on file   Social History Narrative   Married. 2 children. Disabled.    Family History  Problem Relation Age of Onset  . Hypertension Mother   . Arthritis Mother   . Hypertension Father   . Heart disease Father   . Arthritis Father      Review of Systems  Constitutional: Negative.   HENT: Negative.   Eyes: Negative.   Respiratory: Negative.   Cardiovascular: Negative.   Gastrointestinal: Negative for abdominal pain, diarrhea, nausea and vomiting.  Genitourinary: Negative.   Musculoskeletal: Negative.   Skin: Negative.   Neurological: Negative.   Endo/Heme/Allergies: Negative.   Psychiatric/Behavioral: Negative.    Vitals:   10/25/16 0805  BP: 132/82  Pulse: 83  Resp: 17  Temp: 98.9 F (37.2 C)     Physical Exam  Constitutional: He is oriented to person, place, and time. He appears well-developed and well-nourished.  HENT:  Head: Normocephalic and  atraumatic.  Eyes: Conjunctivae and EOM are normal. Pupils are equal, round, and reactive to light.  Neck: Normal range of motion. Neck supple.  Cardiovascular: Normal rate, regular rhythm, normal heart sounds and intact distal pulses.   Pulmonary/Chest: Effort normal and breath sounds normal.  Abdominal: Soft. Bowel sounds are normal. He exhibits no distension. There is no tenderness. There is no guarding.  Musculoskeletal: Normal range of motion.  Neurological: He is alert and oriented to person, place, and time.  Skin: Skin is warm and dry.  Psychiatric: He has a normal mood and affect. His behavior is normal.  Vitals reviewed.    ASSESSMENT & PLAN: Resolved  abdominal pain; no clinical signs of SBO; advised to slowly advance diet and return if worse. Resolved Gastroenteritis/ileus. Ranell Patrickorris was seen today for follow-up.  Diagnoses and all orders for this visit:  Abdominal pain, generalized  Diarrhea, unspecified type  Nausea and vomiting, intractability of vomiting not specified, unspecified vomiting type    Patient Instructions       IF you received an x-ray today, you will receive an invoice from [facility]. Please contact [facility] at [phone] with questions or concerns regarding your invoice.   IF you received labwork today, you will receive an invoice from [facility]. Please contact [facility] at [phone] with questions or concerns regarding your invoice.   Our billing staff will not be able to assist you with questions regarding bills from these companies.  You will be contacted with the lab results as soon as they are available. The fastest way to get your results is to activate your My Chart account. Instructions are located on the last page of this paperwork. If you have not heard from us regarding the results in 2 weeks, please contact this office.      Abdominal Pain, Adult Many things can cause belly (abdominal) pain. Most times, belly pain is not dangerous. Many cases of belly pain can be watched and treated at home. Sometimes belly pain is serious, though. Your doctor will try to find the cause of your belly pain. Follow these instructions at home:  Take over-the-counter and prescription medicines only as told by your doctor. Do not take medicines that help you poop (laxatives) unless told to by your doctor.  Drink enough fluid to keep your pee (urine) clear or pale yellow.  Watch your belly pain for any changes.  Keep all follow-up visits as told by your doctor. This is important. Contact a doctor if:  Your belly pain changes or gets worse.  You are not hungry, or you lose weight without  trying.  You are having trouble pooping (constipated) or have watery poop (diarrhea) for more than 2-3 days.  You have pain when you pee or poop.  Your belly pain wakes you up at night.  Your pain gets worse with meals, after eating, or with certain foods.  You are throwing up and cannot keep anything down.  You have a fever. Get help right away if:  Your pain does not go away as soon as your doctor says it should.  You cannot stop throwing up.  Your pain is only in areas of your belly, such as the right side or the left lower part of the belly.  You have bloody or black poop, or poop that looks like tar.  You have very bad pain, cramping, or bloating in your belly.  You have signs of not having enough fluid or water in your body (dehydration), such  as:  Dark pee, very little pee, or no pee.  Cracked lips.  Dry mouth.  Sunken eyes.  Sleepiness.  Weakness. This information is not intended to replace advice given to you by your health care provider. Make sure you discuss any questions you have with your health care provider. Document Released: 03/27/2008 Document Revised: 04/28/2016 Document Reviewed: 03/22/2016 Elsevier Interactive Patient Education  2017 Elsevier Inc.      Edwina Barth, MD [facility]

## 2016-10-25 NOTE — Patient Instructions (Addendum)
     IF you received an x-ray today, you will receive an invoice from [facility]. Please contact [facility] at [phone] with questions or concerns regarding your invoice.   IF you received labwork today, you will receive an invoice from [facility]. Please contact [facility] at [phone] with questions or concerns regarding your invoice.   Our billing staff will not be able to assist you with questions regarding bills from these companies.  You will be contacted with the lab results as soon as they are available. The fastest way to get your results is to activate your My Chart account. Instructions are located on the last page of this paperwork. If you have not heard from us regarding the results in 2 weeks, please contact this office.     Abdominal Pain, Adult Many things can cause belly (abdominal) pain. Most times, belly pain is not dangerous. Many cases of belly pain can be watched and treated at home. Sometimes belly pain is serious, though. Your doctor will try to find the cause of your belly pain. Follow these instructions at home:  Take over-the-counter and prescription medicines only as told by your doctor. Do not take medicines that help you poop (laxatives) unless told to by your doctor.  Drink enough fluid to keep your pee (urine) clear or pale yellow.  Watch your belly pain for any changes.  Keep all follow-up visits as told by your doctor. This is important. Contact a doctor if:  Your belly pain changes or gets worse.  You are not hungry, or you lose weight without trying.  You are having trouble pooping (constipated) or have watery poop (diarrhea) for more than 2-3 days.  You have pain when you pee or poop.  Your belly pain wakes you up at night.  Your pain gets worse with meals, after eating, or with certain foods.  You are throwing up and cannot keep anything down.  You have a fever. Get help right away if:  Your pain does not go away  as soon as your doctor says it should.  You cannot stop throwing up.  Your pain is only in areas of your belly, such as the right side or the left lower part of the belly.  You have bloody or black poop, or poop that looks like tar.  You have very bad pain, cramping, or bloating in your belly.  You have signs of not having enough fluid or water in your body (dehydration), such as:  Dark pee, very little pee, or no pee.  Cracked lips.  Dry mouth.  Sunken eyes.  Sleepiness.  Weakness. This information is not intended to replace advice given to you by your health care provider. Make sure you discuss any questions you have with your health care provider. Document Released: 03/27/2008 Document Revised: 04/28/2016 Document Reviewed: 03/22/2016 Elsevier Interactive Patient Education  2017 Elsevier Inc.  

## 2016-11-13 ENCOUNTER — Ambulatory Visit (INDEPENDENT_AMBULATORY_CARE_PROVIDER_SITE_OTHER): Payer: Medicare HMO | Admitting: Family Medicine

## 2016-11-13 VITALS — BP 146/96 | HR 75 | Temp 98.3°F | Resp 18 | Ht 71.0 in | Wt 344.2 lb

## 2016-11-13 DIAGNOSIS — I10 Essential (primary) hypertension: Secondary | ICD-10-CM

## 2016-11-13 DIAGNOSIS — M79602 Pain in left arm: Secondary | ICD-10-CM | POA: Diagnosis not present

## 2016-11-13 DIAGNOSIS — G5622 Lesion of ulnar nerve, left upper limb: Secondary | ICD-10-CM | POA: Diagnosis not present

## 2016-11-13 MED ORDER — GABAPENTIN 100 MG PO CAPS: 100.0000 mg | ORAL_CAPSULE | Freq: Three times a day (TID) | ORAL | 0 refills | Status: DC | PRN

## 2016-11-13 MED ORDER — TRAMADOL HCL 50 MG PO TABS: 50.0000 mg | ORAL_TABLET | Freq: Three times a day (TID) | ORAL | 0 refills | Status: DC | PRN

## 2016-11-13 NOTE — Progress Notes (Signed)
Subjective:    Patient ID: Ronald Harding, male    DOB: 02/21/1956, 61 y.o.   MRN: [id number]  HPI Ronald Harding is a 61 y.o. male   History of Hypertension, obesity.  C/o tingling/burning into left 4tht and 5th fingers.  Present for about a month. NKI, no change in activities prior. No elbow, or shoulder injuries recently but had left shoulder dislocation without need for surgery in 2012. Off and on left shoulder pains since that time. No neck pain. No hand weakness, just tingling in hand only. No forearm pain or tingling, no upper arm symptoms. yardwork around house, no regular work otherwise.   Pain kept him up until 4 am last night.   R hand dominant.  Attempted tx: no meds. Feels better when pressure under forearm or elbow bent, or pressure at base of wrist,  Returns when takes pressure off area.    Patient Active Problem List   Diagnosis Date Noted  . Routine general medical examination at a health care facility 06/30/2016  . Thoracic ascending aortic aneurysm (HCC) 08/20/2015  . Essential hypertension 06/30/2015  . Morbid obesity (HCC) 06/30/2015   Past Medical History:  Diagnosis Date  . Aortic aneurysm (HCC)    thoracic ascending  . Asthma    hx of-as a child  . History of colon polyps    benign  . Hypertension    takes Hyzaar daily  . Joint pain   . Pneumonia    hx of-in the 70's   Past Surgical History:  Procedure Laterality Date  . ANKLE SURGERY Right 2012  . COLONOSCOPY    . MULTIPLE EXTRACTIONS WITH ALVEOLOPLASTY N/A 09/23/2015   Procedure: Extraction of tooth #'s 1-12,14- 28, 30-32 with alveoloplasty, bilateral mandibular tori reductions and reduction of lateral exostoses.;  Surgeon: Charlynne Panderonald F Kulinski, DDS;  [facility];  Service: Oral Surgery;  Laterality: N/A;  . NOSE SURGERY  2013   Allergies  Allergen Reactions  . Betadine [Povidone Iodine] Hives    [place] bones. Betadine broke him out.     Prior to Admission medications   Medication Sig Start Date End Date Taking? Authorizing Provider  aspirin 81 MG tablet Take 81 mg by mouth daily.   Yes Historical Provider, MD  losartan-hydrochlorothiazide (HYZAAR) 50-12.5 MG tablet Take 1 tablet by mouth daily. Patient not taking: Reported on 11/13/2016 03/08/16   Myrlene BrokerElizabeth A Crawford, MD   Social History   Social History  . Marital status: Married    Spouse name: N/A  . Number of children: 2  . Years of education: N/A   Occupational History  . Not on file.   Social History Main Topics  . Smoking status: Former Games developermoker  . Smokeless tobacco: Current User     Comment: one pack of chew per week  . Alcohol use No  . Drug use: No  . Sexual activity: Not on file   Other Topics Concern  . Not on file   Social History Narrative   Married. 2 children. Disabled.    Review of Systems  Constitutional: Negative for fatigue and unexpected weight change.  Eyes: Negative for visual disturbance.  Respiratory: Negative for cough, chest tightness and shortness of breath.   Cardiovascular: Negative for chest pain, palpitations and leg swelling.  Gastrointestinal: Negative for abdominal pain and blood in stool.  Musculoskeletal: Positive for arthralgias.  Skin: Negative for color change and rash.  Neurological: Negative  for dizziness, facial asymmetry, speech difficulty, weakness, light-headedness and headaches.       Objective:   Physical Exam  Constitutional: He is oriented to person, place, and time. He appears well-developed and well-nourished.  HENT:  Head: Normocephalic and atraumatic.  Eyes: EOM are normal. Pupils are equal, round, and reactive to light.  Neck: No JVD present. Carotid bruit is not present.  Cardiovascular: Normal rate, regular rhythm and normal heart sounds.   No murmur heard. Pulmonary/Chest: Effort normal and breath sounds normal. He has no rales.  Musculoskeletal: He exhibits no edema.  Pos tinels at  left elbow with dysesthesias into 4th/5th finger on left. No weakness, negative tinel at carpal tunnel, guyons canal, neg phalen. Pain free cspine ROM, neg tinel at brachial plexus.   Neurological: He is alert and oriented to person, place, and time.  Skin: Skin is warm and dry.  Psychiatric: He has a normal mood and affect.  Vitals reviewed.  Vitals:   11/13/16 1045  BP: (!) 146/96  Pulse: 75  Resp: 18  Temp: 98.3 F (36.8 C)  TempSrc: Oral  SpO2: 97%  Weight: (!) 344 lb 3.2 oz (156.1 kg)  Height: 5\' 11"  (1.803 m)       Assessment & Plan:   Ronald Harding is a 61 y.o. male Ulnar neuropathy of left upper extremity - Plan: gabapentin (NEURONTIN) 100 MG capsule, traMADol (ULTRAM) 50 MG tablet, Pain in left arm - Plan: traMADol (ULTRAM) 50 MG tablet  - NKI, appears to be originating at elbow. Possible overuse.   -trial of neurontin up to 100mg  tid. SED. Short course ultram if needed, otc wrist brace if needed and relative rest.   -Recheck in next 2 weeks with imaging or ortho if not improved.   Essential hypertension  - off meds currently. Asymptomatic.  Check home readings, and if elevated - restart meds (at least 1/2 dose), and follow up if remains elevated.   rtc precautions.   Meds ordered this encounter  Medications  . gabapentin (NEURONTIN) 100 MG capsule    Sig: Take 1 capsule (100 mg total) by mouth 3 (three) times daily as needed. Start with one pill at bedtime.    Dispense:  30 capsule    Refill:  0  . traMADol (ULTRAM) 50 MG tablet    Sig: Take 1 tablet (50 mg total) by mouth every 8 (eight) hours as needed.    Dispense:  15 tablet    Refill:  0   Patient Instructions    Avoid direct pressure to wrist and elbow, or if needed place padding under those areas. Avoid repetitive use of elbow or wrist for now and gabapentin once tonight, then if tolerated, can increase up to 3 times per day over the next few days. If not improving in next 104 -14 days, or worsening -  return as you may need to have xrays or evaluation with orthopaedics.   If needed for pain - especially at night, I did write for ultram.  Be careful combining with other sedating medicines including the gabapentin.   Keep a record of your blood pressures outside of the office and if remaining over 140/90, should be on medication. You could start on 1/2 pill if only mildly elevated. Return to discuss further if remaining elevated.     IF you received an x-ray today, you will receive an invoice from [facility]. Please contact [facility] at [phone] with questions or concerns regarding your invoice.  IF you received labwork today, you will receive an invoice from [facility]. Please contact [facility] at [phone] with questions or concerns regarding your invoice.   Our billing staff will not be able to assist you with questions regarding bills from these companies.  You will be contacted with the lab results as soon as they are available. The fastest way to get your results is to activate your My Chart account. Instructions are located on the last page of this paperwork. If you have not heard from [place] regarding the results in 2 weeks, please contact this office.      Signed,   Meredith Staggers, MD [facility].  11/13/16 12:33 PM

## 2016-11-13 NOTE — Patient Instructions (Addendum)
  Avoid direct pressure to wrist and elbow, or if needed place padding under those areas. Avoid repetitive use of elbow or wrist for now and gabapentin once tonight, then if tolerated, can increase up to 3 times per day over the next few days. If not improving in next 104 -14 days, or worsening - return as you may need to have xrays or evaluation with orthopaedics.   If needed for pain - especially at night, I did write for ultram.  Be careful combining with other sedating medicines including the gabapentin.   Keep a record of your blood pressures outside of the office and if remaining over 140/90, should be on medication. You could start on 1/2 pill if only mildly elevated. Return to discuss further if remaining elevated.     IF you received an x-ray today, you will receive an invoice from [facility]. Please contact [facility] at [phone] with questions or concerns regarding your invoice.   IF you received labwork today, you will receive an invoice from [facility]. Please contact [facility] at [phone] with questions or concerns regarding your invoice.   Our billing staff will not be able to assist you with questions regarding bills from these companies.  You will be contacted with the lab results as soon as they are available. The fastest way to get your results is to activate your My Chart account. Instructions are located on the last page of this paperwork. If you have not heard from us regarding the results in 2 weeks, please contact this office.

## 2017-01-22 DIAGNOSIS — H348312 Tributary (branch) retinal vein occlusion, right eye, stable: Secondary | ICD-10-CM | POA: Diagnosis not present

## 2017-01-22 DIAGNOSIS — H2511 Age-related nuclear cataract, right eye: Secondary | ICD-10-CM | POA: Diagnosis not present

## 2017-01-22 DIAGNOSIS — I10 Essential (primary) hypertension: Secondary | ICD-10-CM | POA: Diagnosis not present

## 2017-01-22 DIAGNOSIS — H2512 Age-related nuclear cataract, left eye: Secondary | ICD-10-CM | POA: Diagnosis not present

## 2017-03-21 NOTE — Telephone Encounter (Signed)
Seen by me on 10/25/16 with much improvement.

## 2017-06-13 ENCOUNTER — Other Ambulatory Visit: Payer: Self-pay | Admitting: Cardiothoracic Surgery

## 2017-06-13 DIAGNOSIS — I712 Thoracic aortic aneurysm, without rupture, unspecified: Secondary | ICD-10-CM

## 2017-06-15 ENCOUNTER — Other Ambulatory Visit: Payer: Self-pay | Admitting: Cardiothoracic Surgery

## 2017-06-15 DIAGNOSIS — I712 Thoracic aortic aneurysm, without rupture, unspecified: Secondary | ICD-10-CM

## 2017-06-17 ENCOUNTER — Other Ambulatory Visit: Payer: Self-pay | Admitting: Thoracic Surgery (Cardiothoracic Vascular Surgery)

## 2017-06-17 DIAGNOSIS — R030 Elevated blood-pressure reading, without diagnosis of hypertension: Secondary | ICD-10-CM

## 2017-06-17 DIAGNOSIS — I712 Thoracic aortic aneurysm, without rupture: Secondary | ICD-10-CM

## 2017-06-17 DIAGNOSIS — I7121 Aneurysm of the ascending aorta, without rupture: Secondary | ICD-10-CM

## 2017-07-05 ENCOUNTER — Ambulatory Visit (INDEPENDENT_AMBULATORY_CARE_PROVIDER_SITE_OTHER): Payer: Medicare HMO | Admitting: Internal Medicine

## 2017-07-05 ENCOUNTER — Encounter: Payer: Self-pay | Admitting: Internal Medicine

## 2017-07-05 ENCOUNTER — Other Ambulatory Visit (INDEPENDENT_AMBULATORY_CARE_PROVIDER_SITE_OTHER): Payer: Medicare HMO

## 2017-07-05 VITALS — BP 148/92 | HR 68 | Temp 98.9°F | Ht 71.0 in | Wt 341.0 lb

## 2017-07-05 DIAGNOSIS — I10 Essential (primary) hypertension: Secondary | ICD-10-CM

## 2017-07-05 DIAGNOSIS — Z0000 Encounter for general adult medical examination without abnormal findings: Secondary | ICD-10-CM | POA: Diagnosis not present

## 2017-07-05 DIAGNOSIS — I712 Thoracic aortic aneurysm, without rupture: Secondary | ICD-10-CM

## 2017-07-05 DIAGNOSIS — I7121 Aneurysm of the ascending aorta, without rupture: Secondary | ICD-10-CM

## 2017-07-05 LAB — COMPREHENSIVE METABOLIC PANEL WITH GFR
ALT: 23 U/L (ref 0–53)
AST: 23 U/L (ref 0–37)
Albumin: 4.1 g/dL (ref 3.5–5.2)
Alkaline Phosphatase: 107 U/L (ref 39–117)
BUN: 13 mg/dL (ref 6–23)
CO2: 27 meq/L (ref 19–32)
Calcium: 9.4 mg/dL (ref 8.4–10.5)
Chloride: 105 meq/L (ref 96–112)
Creatinine, Ser: 0.94 mg/dL (ref 0.40–1.50)
GFR: 104.92 mL/min
Glucose, Bld: 89 mg/dL (ref 70–99)
Potassium: 4.3 meq/L (ref 3.5–5.1)
Sodium: 137 meq/L (ref 135–145)
Total Bilirubin: 0.4 mg/dL (ref 0.2–1.2)
Total Protein: 7.8 g/dL (ref 6.0–8.3)

## 2017-07-05 LAB — LIPID PANEL
Cholesterol: 135 mg/dL (ref 0–200)
HDL: 40.6 mg/dL
LDL Cholesterol: 68 mg/dL (ref 0–99)
NonHDL: 94.33
Total CHOL/HDL Ratio: 3
Triglycerides: 132 mg/dL (ref 0.0–149.0)
VLDL: 26.4 mg/dL (ref 0.0–40.0)

## 2017-07-05 LAB — HEMOGLOBIN A1C: Hgb A1c MFr Bld: 6.3 % (ref 4.6–6.5)

## 2017-07-05 MED ORDER — PREDNISONE 20 MG PO TABS: 40.0000 mg | ORAL_TABLET | Freq: Every day | ORAL | 0 refills | Status: DC

## 2017-07-05 MED ORDER — LOSARTAN POTASSIUM-HCTZ 50-12.5 MG PO TABS: 1.0000 | ORAL_TABLET | Freq: Every day | ORAL | 1 refills | Status: DC

## 2017-07-05 NOTE — Assessment & Plan Note (Signed)
BP high and he is not taking meds. Refill sent and educated on the reasons to take his BP meds to the patient and his wife. He agrees grudgingly to take his medication. Checking CMP and adjust as needed.

## 2017-07-05 NOTE — Assessment & Plan Note (Signed)
Gets done yearly and minimal.

## 2017-07-05 NOTE — Assessment & Plan Note (Signed)
Declines all vaccines. Colonoscopy up to date. Counseled on flu and shingrix. Counseled about dangers of distracted driving. Given screening recommendations.

## 2017-07-05 NOTE — Progress Notes (Signed)
   Subjective:    Patient ID: Ronald Harding, male    DOB: 10/07/1956, 61 y.o.   MRN: [id number]  HPI The patient is a 61 YO man coming in for wellness. Having some new concerns which are addressed.   [facility], [facility], social history reviewed and updated.   Review of Systems  Constitutional: Negative.   HENT: Negative.   Eyes: Negative.   Respiratory: Positive for cough. Negative for chest tightness and shortness of breath.        Chronic  Cardiovascular: Negative for chest pain, palpitations and leg swelling.  Gastrointestinal: Negative for abdominal distention, abdominal pain, constipation, diarrhea, nausea and vomiting.  Musculoskeletal: Negative.   Skin: Negative.   Neurological: Negative.   Psychiatric/Behavioral: Negative.       Objective:   Physical Exam  Constitutional: He is oriented to person, place, and time. He appears well-developed and well-nourished.  Obese  HENT:  Head: Normocephalic and atraumatic.  Eyes: EOM are normal.  Neck: Normal range of motion.  Cardiovascular: Normal rate and regular rhythm.   Pulmonary/Chest: Effort normal and breath sounds normal. No respiratory distress. He has no wheezes. He has no rales.  Abdominal: Soft. Bowel sounds are normal. He exhibits no distension. There is no tenderness. There is no rebound.  Musculoskeletal: He exhibits no edema.  Neurological: He is alert and oriented to person, place, and time. Coordination normal.  Skin: Skin is warm and dry.  Psychiatric: He has a normal mood and affect.   Vitals:   07/05/17 1301 07/05/17 1341  BP: (!) 148/92 (!) 148/92  Pulse: 68   Temp: 98.9 F (37.2 C)   TempSrc: Oral   SpO2: 99%   Weight: (!) 341 lb (154.7 kg)   Height: 5\' 11"  (1.803 m)       Assessment & Plan:

## 2017-07-05 NOTE — Patient Instructions (Signed)
We have sent in the prednisone for the hip pain. Take 2 pills daily for 5 days.   If the hip is not better in 1-2 weeks call us back.    Trochanteric Bursitis Rehab Ask your health care provider which exercises are safe for you. Do exercises exactly as told by your health care provider and adjust them as directed. It is normal to feel mild stretching, pulling, tightness, or discomfort as you do these exercises, but you should stop right away if you feel sudden pain or your pain gets worse.Do not begin these exercises until told by your health care provider. Stretching exercises These exercises warm up your muscles and joints and improve the movement and flexibility of your hip. These exercises also help to relieve pain and stiffness. Exercise A: Iliotibial band stretch  1. Lie on your side with your left / right leg in the top position. 2. Bend your left / right knee and grab your ankle. 3. Slowly bring your knee back so your thigh is behind your body. 4. Slowly lower your knee toward the floor until you feel a gentle stretch on the outside of your left / right thigh. If you do not feel a stretch and your knee will not fall farther, place the heel of your other foot on top of your outer knee and pull your thigh down farther. 5. Hold this position for __________ seconds. 6. Slowly return to the starting position. Repeat __________ times. Complete this exercise __________ times a day. Strengthening exercises These exercises build strength and endurance in your hip and pelvis. Endurance is the ability to use your muscles for a long time, even after they get tired. Exercise B: Bridge ( hip extensors) 1. Lie on your back on a firm surface with your knees bent and your feet flat on the floor. 2. Tighten your buttocks muscles and lift your buttocks off the floor until your trunk is level with your thighs. You should feel the muscles working in your buttocks and the back of your thighs. If this  exercise is too easy, try doing it with your arms crossed over your chest. 3. Hold this position for __________ seconds. 4. Slowly return to the starting position. 5. Let your muscles relax completely between repetitions. Repeat __________ times. Complete this exercise __________ times a day. Exercise C: Squats ( knee extensors and  quadriceps) 1. Stand in front of a table, with your feet and knees pointing straight ahead. You may rest your hands on the table for balance but not for support. 2. Slowly bend your knees and lower your hips like you are going to sit in a chair. ? Keep your weight over your heels, not over your toes. ? Keep your lower legs upright so they are parallel with the table legs. ? Do not let your hips go lower than your knees. ? Do not bend lower than told by your health care provider. ? If your hip pain increases, do not bend as low. 3. Hold this position for __________ seconds. 4. Slowly push with your legs to return to standing. Do not use your hands to pull yourself to standing. Repeat __________ times. Complete this exercise __________ times a day. Exercise D: Hip hike 1. Stand sideways on a bottom step. Stand on your left / right leg with your other foot unsupported next to the step. You can hold onto the railing or wall if needed for balance. 2. Keeping your knees straight and your torso square, lift  your left / right hip up toward the ceiling. 3. Hold this position for __________ seconds. 4. Slowly let your left / right hip lower toward the floor, past the starting position. Your foot should get closer to the floor. Do not lean or bend your knees. Repeat __________ times. Complete this exercise __________ times a day. Exercise E: Single leg stand 1. Stand near a counter or door frame that you can hold onto for balance as needed. It is helpful to stand in front of a mirror for this exercise so you can watch your hip. 2. Squeeze your left / right buttock muscles  then lift up your other foot. Do not let your left / right hip push out to the side. 3. Hold this position for __________ seconds. Repeat __________ times. Complete this exercise __________ times a day. This information is not intended to replace advice given to you by your health care provider. Make sure you discuss any questions you have with your health care provider. Document Released: 11/16/2004 Document Revised: 06/15/2016 Document Reviewed: 09/24/2015 Elsevier Interactive Patient Education  2018 [employer].   Health Maintenance, Male A healthy lifestyle and preventive care is important for your health and wellness. Ask your health care provider about what schedule of regular examinations is right for you. What should I know about weight and diet? Eat a Healthy Diet  Eat plenty of vegetables, fruits, whole grains, low-fat dairy products, and lean protein.  Do not eat a lot of foods high in solid fats, added sugars, or salt.  Maintain a Healthy Weight Regular exercise can help you achieve or maintain a healthy weight. You should:  Do at least 150 minutes of exercise each week. The exercise should increase your heart rate and make you sweat (moderate-intensity exercise).  Do strength-training exercises at least twice a week.  Watch Your Levels of Cholesterol and Blood Lipids  Have your blood tested for lipids and cholesterol every 5 years starting at 61 years of age. If you are at high risk for heart disease, you should start having your blood tested when you are 61 years old. You may need to have your cholesterol levels checked more often if: ? Your lipid or cholesterol levels are high. ? You are older than 61 years of age. ? You are at high risk for heart disease.  What should I know about cancer screening? Many types of cancers can be detected early and may often be prevented. Lung Cancer  You should be screened every year for lung cancer if: ? You are a current smoker  who has smoked for at least 30 years. ? You are a former smoker who has quit within the past 15 years.  Talk to your health care provider about your screening options, when you should start screening, and how often you should be screened.  Colorectal Cancer  Routine colorectal cancer screening usually begins at 61 years of age and should be repeated every 5-10 years until you are 61 years old. You may need to be screened more often if early forms of precancerous polyps or small growths are found. Your health care provider may recommend screening at an earlier age if you have risk factors for colon cancer.  Your health care provider may recommend using home test kits to check for hidden blood in the stool.  A small camera at the end of a tube can be used to examine your colon (sigmoidoscopy or colonoscopy). This checks for the earliest forms of colorectal  cancer.  Prostate and Testicular Cancer  Depending on your age and overall health, your health care provider may do certain tests to screen for prostate and testicular cancer.  Talk to your health care provider about any symptoms or concerns you have about testicular or prostate cancer.  Skin Cancer  Check your skin from head to toe regularly.  Tell your health care provider about any new moles or changes in moles, especially if: ? There is a change in a mole's size, shape, or color. ? You have a mole that is larger than a pencil eraser.  Always use sunscreen. Apply sunscreen liberally and repeat throughout the day.  Protect yourself by wearing long sleeves, pants, a wide-brimmed hat, and sunglasses when outside.  What should I know about heart disease, diabetes, and high blood pressure?  If you are 19-53 years of age, have your blood pressure checked every 3-5 years. If you are 76 years of age or older, have your blood pressure checked every year. You should have your blood pressure measured twice-once when you are at a hospital or  clinic, and once when you are not at a hospital or clinic. Record the average of the two measurements. To check your blood pressure when you are not at a hospital or clinic, you can use: ? An automated blood pressure machine at a pharmacy. ? A home blood pressure monitor.  Talk to your health care provider about your target blood pressure.  If you are between 63-76 years old, ask your health care provider if you should take aspirin to prevent heart disease.  Have regular diabetes screenings by checking your fasting blood sugar level. ? If you are at a normal weight and have a low risk for diabetes, have this test once every three years after the age of 20. ? If you are overweight and have a high risk for diabetes, consider being tested at a younger age or more often.  A one-time screening for abdominal aortic aneurysm (AAA) by ultrasound is recommended for men aged 65-75 years who are current or former smokers. What should I know about preventing infection? Hepatitis B If you have a higher risk for hepatitis B, you should be screened for this virus. Talk with your health care provider to find out if you are at risk for hepatitis B infection. Hepatitis C Blood testing is recommended for:  Everyone born from [birth year] through 1965.  Anyone with known risk factors for hepatitis C.  Sexually Transmitted Diseases (STDs)  You should be screened each year for STDs including gonorrhea and chlamydia if: ? You are sexually active and are younger than 61 years of age. ? You are older than 61 years of age and your health care provider tells you that you are at risk for this type of infection. ? Your sexual activity has changed since you were last screened and you are at an increased risk for chlamydia or gonorrhea. Ask your health care provider if you are at risk.  Talk with your health care provider about whether you are at high risk of being infected with HIV. Your health care provider may recommend a  prescription medicine to help prevent HIV infection.  What else can I do?  Schedule regular health, dental, and eye exams.  Stay current with your vaccines (immunizations).  Do not use any tobacco products, such as cigarettes, chewing tobacco, and e-cigarettes. If you need help quitting, ask your health care provider.  Limit alcohol intake to no more  than 2 drinks per day. One drink equals 12 ounces of beer, 5 ounces of wine, or 1 ounces of hard liquor.  Do not use street drugs.  Do not share needles.  Ask your health care provider for help if you need support or information about quitting drugs.  Tell your health care provider if you often feel depressed.  Tell your health care provider if you have ever been abused or do not feel safe at home. This information is not intended to replace advice given to you by your health care provider. Make sure you discuss any questions you have with your health care provider. Document Released: 04/06/2008 Document Revised: 06/07/2016 Document Reviewed: 07/13/2015 Elsevier Interactive Patient Education  [employer].

## 2017-07-05 NOTE — Assessment & Plan Note (Signed)
BMI >40 and with complications. He is encouraged to make some changes to portions and decrease snacking. Add some exercise in the water as he has arthritis and he will think about it.

## 2017-07-18 ENCOUNTER — Ambulatory Visit: Payer: Self-pay | Admitting: Cardiothoracic Surgery

## 2017-07-18 ENCOUNTER — Other Ambulatory Visit: Payer: Self-pay

## 2017-07-23 ENCOUNTER — Other Ambulatory Visit: Payer: Self-pay | Admitting: Internal Medicine

## 2017-07-23 ENCOUNTER — Telehealth: Payer: Self-pay | Admitting: Internal Medicine

## 2017-07-23 MED ORDER — DICLOFENAC SODIUM 75 MG PO TBEC: 75.0000 mg | DELAYED_RELEASE_TABLET | Freq: Two times a day (BID) | ORAL | 0 refills | Status: DC

## 2017-07-23 NOTE — Telephone Encounter (Signed)
Would recommend to see Dr. Jordan Likes this week instead as he can fix the problem with an injection which I can not do. Have sent in a medicine to take called voltaren can take 1 pill twice daily as needed.

## 2017-07-23 NOTE — Telephone Encounter (Signed)
Notified pt w/MD response. [facility]states [facility] inform him will resend to [facility].../lmb

## 2017-07-23 NOTE — Telephone Encounter (Signed)
Spouse called in stating that patient is in a lot of pain and has finished anti inflammatory medication.  Patient only wants to see Dr. Okey Dupre and is scheduled to see her on 10/8.  Would like to know if Dr. Okey Dupre can prescribe anything until this appt?

## 2017-07-25 ENCOUNTER — Ambulatory Visit (INDEPENDENT_AMBULATORY_CARE_PROVIDER_SITE_OTHER): Payer: Medicare HMO | Admitting: Cardiothoracic Surgery

## 2017-07-25 ENCOUNTER — Encounter: Payer: Self-pay | Admitting: Cardiothoracic Surgery

## 2017-07-25 ENCOUNTER — Ambulatory Visit
Admission: RE | Admit: 2017-07-25 | Discharge: 2017-07-25 | Disposition: A | Discharge status: Home / Self Care | Payer: Medicare HMO | Source: Ambulatory Visit | Attending: Cardiothoracic Surgery | Admitting: Cardiothoracic Surgery

## 2017-07-25 VITALS — BP 134/78 | HR 85 | Ht 71.0 in | Wt 341.0 lb

## 2017-07-25 DIAGNOSIS — I712 Thoracic aortic aneurysm, without rupture, unspecified: Secondary | ICD-10-CM

## 2017-07-25 DIAGNOSIS — I700 Atherosclerosis of aorta: Secondary | ICD-10-CM | POA: Diagnosis not present

## 2017-07-25 MED ORDER — IOPAMIDOL (ISOVUE-370) INJECTION 76%
75.0000 mL | Freq: Once | INTRAVENOUS | Status: AC | PRN
  Administered 2017-07-25: 75 mL via INTRAVENOUS

## 2017-07-25 NOTE — Progress Notes (Signed)
PCP is Myrlene Broker, MD Referring Provider is Pecola Lawless, MD  Chief Complaint  Patient presents with  . 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 The patient has hypertension treated with losartan HCTZ. He is a nonsmoker. There is no family history of aortic dissection. Today's CT scan shows no change in the diameter of the ascending aorta which is stable at 4.5 cm. There is no mural thickening or hematoma of the thoracic aorta. The risk of dissection is less than 2% at his current diameter and surgery is not recommended until the diameter exceeds 5.5 cm. Blood pressure medication, regular exercise, aspirin 81 mg daily, and weight loss of the best therapy for his aorta at this point.  Past Medical History:  Diagnosis Date  . Aortic aneurysm (HCC)    thoracic ascending  . Asthma    hx of-as a child  . History of colon polyps    benign  . Hypertension    takes Hyzaar daily  . Joint pain   . Pneumonia    hx of-in the 70's    Past Surgical History:  Procedure Laterality Date  . ANKLE SURGERY Right 2012  . COLONOSCOPY    . MULTIPLE EXTRACTIONS WITH ALVEOLOPLASTY N/A 09/23/2015   Procedure: Extraction of tooth #'s 1-12,14- 28, 30-32 with alveoloplasty, bilateral mandibular tori reductions and reduction of lateral exostoses.;  Surgeon: Charlynne Pander, DDS;  Location: [facility] OR;  Service: Oral Surgery;  Laterality: N/A;  . NOSE SURGERY  2013    Family History  Problem Relation Age of Onset  . Hypertension Mother   . Arthritis Mother   . Hypertension Father   . Heart disease Father   . Arthritis Father     Social History Social History  Substance Use Topics  . Smoking status: Former Games developer  . Smokeless tobacco: Current User     Comment: one pack of chew per week  . Alcohol use No    Current Outpatient Prescriptions   Medication Sig Dispense Refill  . aspirin 81 MG tablet Take 81 mg by mouth daily.    . diclofenac (VOLTAREN) 75 MG EC tablet Take 1 tablet (75 mg total) by mouth 2 (two) times daily. 30 tablet 0  . ibuprofen (ADVIL,MOTRIN) 800 MG tablet Take 800 mg by mouth every 8 (eight) hours as needed.    Marland Kitchen losartan-hydrochlorothiazide (HYZAAR) 50-12.5 MG tablet Take 1 tablet by mouth daily. 90 tablet 1   No current facility-administered medications for this visit.     Allergies  Allergen Reactions  . Betadine [Povidone Iodine] Hives    Pt states in 2012 in [place] he fell and broke several bones. Betadine broke him out.     Review of Systems   No recent hospitalizations No chest pain No presyncope No active dental complaints No change in bowel habits No lower extremity weakness  BP 134/78   Pulse 85   Ht  (1.803 m)   Wt (!) 341 lb (154.7 kg)   SpO2 97%   BMI 47.56 kg/m  Physical Exam      Exam    General- alert and comfortable, obese 340 pounds   Lungs- clear without rales, wheezes   Cor- regular rate and rhythm, no murmur , gallop   Abdomen- soft, non-tender   Extremities - warm, non-tender, minimal  edema   Neuro- oriented, appropriate, no focal weakness   Diagnostic Tests: CTA images personally reviewed and counseled with patient. His fusiform ascending aneurysm stable at 4.5 cm  Impression: No indication for surgery until diameter exceeds 5.5 cm. Continue blood pressure medication and surveillance scans  Plan:Return in one year with CTA of the thoracic aorta Heart healthy diet and lifestyle recommended and reviewed with patient.  Mikey Bussing, MD [facility] [phone]

## 2017-07-30 ENCOUNTER — Ambulatory Visit: Payer: Self-pay | Admitting: Internal Medicine

## 2017-08-10 ENCOUNTER — Encounter: Payer: Self-pay | Admitting: Family Medicine

## 2017-08-10 ENCOUNTER — Ambulatory Visit (INDEPENDENT_AMBULATORY_CARE_PROVIDER_SITE_OTHER): Payer: Medicare HMO | Admitting: Family Medicine

## 2017-08-10 VITALS — BP 122/82 | HR 71 | Temp 98.2°F | Ht 71.0 in | Wt 340.0 lb

## 2017-08-10 DIAGNOSIS — M7062 Trochanteric bursitis, left hip: Secondary | ICD-10-CM | POA: Insufficient documentation

## 2017-08-10 DIAGNOSIS — Z23 Encounter for immunization: Secondary | ICD-10-CM

## 2017-08-10 MED ORDER — METHYLPREDNISOLONE ACETATE 40 MG/ML IJ SUSP
40.0000 mg | Freq: Once | INTRAMUSCULAR | Status: AC
  Administered 2017-08-10: 40 mg via INTRA_ARTICULAR

## 2017-08-10 NOTE — Assessment & Plan Note (Signed)
Has a component of gluteus medius weakness is associated with this greater trochanter bursitis. Does not appear to be associated with his back. - Injection provided today - Counseled on rehabilitation exercises and weight loss - If no improvement may need to refer to physical therapy. May need to look at his feet as he does have a history of a right ankle fracture with surgical correction that could be related to his gait

## 2017-08-10 NOTE — Patient Instructions (Signed)
Thank you for coming in,   Please try the exercises.   Please apply ice to the area.    Please feel free to call with any questions or concerns at any time, at [phone]. --Dr. Jordan LikesSchmitz

## 2017-08-10 NOTE — Progress Notes (Signed)
Ronald Harding - 61 y.o. male MRN [id number]  Date of birth: 01/26/1956  SUBJECTIVE:  Including CC & ROS.  Chief Complaint  Patient presents with  . Hip Pain    left--has been there for a few months, has got worse, no known injury    Ronald Harding is a 61 year old male that is presenting with left lateral hip pain. The pain is moderate to severe in nature. The pain is exacerbated with walking. Denies any radicular type pains. Denies any injury or prior surgery. Pain started with no inciting event. Pain is sharp and stabbing in nature. He is currently on disability. Several years ago he fell through a roof and landed on his shoulder. He had a broken right arm, right ankle, left shoulder dislocation and broken nose. He has not had any injection therapy before.   Was seen on 9/13 for hip pain and prescribed prednisone. Had some improvement with the pain but it returned.  Review of Systems  Constitutional: Negative for fever.  Musculoskeletal: Positive for gait problem. Negative for back pain and joint swelling.  Skin: Negative for color change.  Neurological: Negative for weakness and numbness.    HISTORY: Past Medical, Surgical, Social, and Family History Reviewed & Updated per EMR.   Pertinent Historical Findings include:  Past Medical History:  Diagnosis Date  . Aortic aneurysm (HCC)    thoracic ascending  . Asthma    hx of-as a child  . History of colon polyps    benign  . Hypertension    takes Hyzaar daily  . Joint pain   . Pneumonia    hx of-in the 70's    Past Surgical History:  Procedure Laterality Date  . ANKLE SURGERY Right 2012  . COLONOSCOPY    . MULTIPLE EXTRACTIONS WITH ALVEOLOPLASTY N/A 09/23/2015   Procedure: Extraction of tooth #'s 1-12,14- 28, 30-32 with alveoloplasty, bilateral mandibular tori reductions and reduction of lateral exostoses.;  Surgeon: Charlynne Panderonald F Kulinski, DDS;  [facility] OR;  Service: Oral Surgery;  Laterality: N/A;  . NOSE SURGERY  2013     Allergies  Allergen Reactions  . Betadine [Povidone Iodine] Hives    Pt states in 2012 in [place] he fell and broke several bones. Betadine broke him out.     Family History  Problem Relation Age of Onset  . Hypertension Mother   . Arthritis Mother   . Hypertension Father   . Heart disease Father   . Arthritis Father      Social History   Social History  . Marital status: Married    Spouse name: N/A  . Number of children: 2  . Years of education: N/A   Occupational History  . Not on file.   Social History Main Topics  . Smoking status: Former Games developermoker  . Smokeless tobacco: Current User     Comment: one pack of chew per week  . Alcohol use No  . Drug use: No  . Sexual activity: Not on file   Other Topics Concern  . Not on file   Social History Narrative   Married. 2 children. Disabled.     PHYSICAL EXAM:  VS: BP 122/82 (BP Location: Left Arm, Patient Position: Sitting, Cuff Size: Large)   Pulse 71   Temp 98.2 F (36.8 C) (Oral)   Ht 5\' 11"  (1.803 m)   Wt (!) 340 lb (154.2 kg)   SpO2 99%   BMI 47.42 kg/m  Physical Exam Gen: NAD, alert, cooperative with exam,  well-appearing ENT: normal lips, normal nasal mucosa,  Eye: normal EOM, normal conjunctiva and lids CV:  no edema, +2 pedal pulses   Resp: no accessory muscle use, non-labored,  Skin: no rashes, no areas of induration  Neuro: normal tone, normal sensation to touch Psych:  normal insight, alert and oriented MSK:  Left hip: Tenderness to palpation over the greater trochanter. Normal internal and external rotation of the hip Normal strength hip flexion through systems. Normal knee flexion and extension. Negative straight leg raise. Weakness with hip abduction. Neurovascularly intact      ASSESSMENT & PLAN:   Greater trochanteric bursitis of left hip Has a component of gluteus medius weakness is associated with this greater trochanter bursitis. Does not appear to be associated with  his back. - Injection provided today - Counseled on rehabilitation exercises and weight loss - If no improvement may need to refer to physical therapy. May need to look at his feet as he does have a history of a right ankle fracture with surgical correction that could be related to his gait

## 2017-08-28 ENCOUNTER — Ambulatory Visit (INDEPENDENT_AMBULATORY_CARE_PROVIDER_SITE_OTHER): Payer: Medicare HMO | Admitting: Family Medicine

## 2017-08-28 ENCOUNTER — Ambulatory Visit: Payer: Self-pay | Admitting: Internal Medicine

## 2017-08-28 ENCOUNTER — Encounter: Payer: Self-pay | Admitting: Family Medicine

## 2017-08-28 VITALS — BP 128/82 | HR 89 | Temp 98.4°F | Ht 71.0 in | Wt 345.0 lb

## 2017-08-28 DIAGNOSIS — M7062 Trochanteric bursitis, left hip: Secondary | ICD-10-CM | POA: Diagnosis not present

## 2017-08-28 MED ORDER — CYCLOBENZAPRINE HCL 10 MG PO TABS: 10.0000 mg | ORAL_TABLET | Freq: Three times a day (TID) | ORAL | 0 refills | Status: DC | PRN

## 2017-08-28 NOTE — Progress Notes (Signed)
[name] Queryorris R Keough - 61 y.o. male MRN [id number]  Date of birth: 07/01/1956  SUBJECTIVE:  Including CC & ROS.  Chief Complaint  Patient presents with  . Left hip pain    He states the pain has not improved since 08/10/17. Today he states it is not hurting as bad. The pain is constant, he describes the pain as muscle cramp.     Mr. Providence LaniusHowell is a 61 y.o. male that is presenting with ongoing left-sided hip pain. He received a greater trochanteric injection and has had minimal improvement. Today he does not have significant pain. The pain is localized on the lateral aspect of his hip and his buttock. Has some radicular symptoms on the lateral aspect of his left leg from time to time. The pain is intermittent in nature. The pain is worse when he has to get up in the morning. Has not had any injuries in the meantime. He does have a remote history of falling through a ceiling is currently disabled from that. Currently does not have any pain today. Has been taking diclofenac with minimal improvement.  Discussed his ongoing weight is likely contributing to his problems.  Was seen on 10/19 and provided an injection.   Review of Systems  Constitutional: Negative for fever.  Musculoskeletal: Positive for arthralgias. Negative for gait problem and joint swelling.  Skin: Negative for color change.  Neurological: Negative for numbness.  Hematological: Negative for adenopathy.    HISTORY: Past Medical, Surgical, Social, and Family History Reviewed & Updated per EMR.   Pertinent Historical Findings include:  Past Medical History:  Diagnosis Date  . Aortic aneurysm (HCC)    thoracic ascending  . Asthma    hx of-as a child  . History of colon polyps    benign  . Hypertension    takes Hyzaar daily  . Joint pain   . Pneumonia    hx of-in the 70's    Past Surgical History:  Procedure Laterality Date  . ANKLE SURGERY Right 2012  . COLONOSCOPY    . NOSE SURGERY  2013    Allergies  Allergen  Reactions  . Betadine [Povidone Iodine] Hives    [place] in [place] he fell and broke several bones. Betadine broke him out.     Family History  Problem Relation Age of Onset  . Hypertension Mother   . Arthritis Mother   . Hypertension Father   . Heart disease Father   . Arthritis Father      Social History   Socioeconomic History  . Marital status: Married    Spouse name: Not on file  . Number of children: 2  . Years of education: Not on file  . Highest education level: Not on file  Social Needs  . Financial resource strain: Not on file  . Food insecurity - worry: Not on file  . Food insecurity - inability: Not on file  . Transportation needs - medical: Not on file  . Transportation needs - non-medical: Not on file  Occupational History  . Not on file  Tobacco Use  . Smoking status: Former Games developermoker  . Smokeless tobacco: Current User  . Tobacco comment: one pack of chew per week  Substance and Sexual Activity  . Alcohol use: No    Alcohol/week: 0.0 oz  . Drug use: No  . Sexual activity: Not on file  Other Topics Concern  . Not on file  Social History Narrative   Married. 2 children. Disabled.  PHYSICAL EXAM:  VS: BP 128/82 (BP Location: Left Arm, Patient Position: Sitting, Cuff Size: Large)   Pulse 89   Temp 98.4 F (36.9 C) (Oral)   Ht 5\' 11"  (1.803 m)   Wt (!) 345 lb (156.5 kg)   SpO2 98%   BMI 48.12 kg/m  Physical Exam Gen: NAD, alert, cooperative with exam, well-appearing ENT: normal lips, normal nasal mucosa,  Eye: normal EOM, normal conjunctiva and lids CV:  no edema, +2 pedal pulses   Resp: no accessory muscle use, non-labored,  Skin: no rashes, no areas of induration  Neuro: normal tone, normal sensation to touch Psych:  normal insight, alert and oriented MSK:  Left hip: No significant tender to palpation or greater trochanter. Normal internal and external rotation to resistance. Normal strength with hip flexion to  resistance. Normal knee flexion and extension. Pes planus bilaterally. Normal abnormal callus formation on the plantar aspect of the feet. Right ankle lateral malleolus is swollen and limited dorsiflexion. 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 Possible that this could also be coming from his back. - Can stop the diclofenac. We'll start Flexeril. - Referral to physical therapy. - If no improvement can consider imaging of his hip or his back.   Morbid obesity Encouraged to continue working out and weight loss. - Could consider referral to nutritionist and/or obesity counselor - Nonimpact exercises would be good to initiate such as recumbent bike and aquatic

## 2017-08-28 NOTE — Patient Instructions (Signed)
Thank you for coming in,   The muscle relaxer can make you drowsy so be careful using it if you have to drive or do something strenuous the next day.   Please let me know if you're not improving and we can consider physical therapy.   Please feel free to call with any questions or concerns at any time, at [phone]. --Dr. Jordan LikesSchmitz  Take tylenol 650 mg three times a day   Glucosamine sulfate 750mg  twice a day is a supplement that has been shown to help moderate to severe arthritis.  Fish oil 2 grams daily.   Tumeric 500mg  twice daily.   Capsaicin topically up to four times a day may also help with pain.  Controlling your weight is important.   Consider physical therapy to strengthen muscles around the joint that hurts to take pressure off of the joint itself.  Shoe inserts with good arch support may be helpful.  [employer] [facility] [employer] could help.   Water aerobics and cycling with low resistance are the best two types of exercise for arthritis.

## 2017-08-28 NOTE — Assessment & Plan Note (Addendum)
Encouraged to continue working out and weight loss. - Could consider referral to nutritionist and/or obesity counselor - Nonimpact exercises would be good to initiate such as recumbent bike and aquatic

## 2017-08-28 NOTE — Assessment & Plan Note (Signed)
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 Possible that this could also be coming from his back. - Can stop the diclofenac. We'll start Flexeril. - Referral to physical therapy. - If no improvement can consider imaging of his hip or his back.

## 2017-09-12 ENCOUNTER — Other Ambulatory Visit: Payer: Self-pay | Admitting: Family Medicine

## 2017-09-17 ENCOUNTER — Other Ambulatory Visit: Payer: Self-pay | Admitting: Family Medicine

## 2017-09-17 MED ORDER — CYCLOBENZAPRINE HCL 10 MG PO TABS: ORAL_TABLET | ORAL | 0 refills | Status: DC

## 2017-09-18 ENCOUNTER — Encounter: Payer: Self-pay | Admitting: Family Medicine

## 2017-09-18 ENCOUNTER — Ambulatory Visit (INDEPENDENT_AMBULATORY_CARE_PROVIDER_SITE_OTHER): Payer: Medicare HMO

## 2017-09-18 ENCOUNTER — Ambulatory Visit: Payer: Medicare HMO | Admitting: Family Medicine

## 2017-09-18 VITALS — BP 132/80 | HR 87 | Temp 98.7°F | Resp 17 | Ht 71.0 in | Wt 340.0 lb

## 2017-09-18 DIAGNOSIS — R7303 Prediabetes: Secondary | ICD-10-CM

## 2017-09-18 DIAGNOSIS — M5432 Sciatica, left side: Secondary | ICD-10-CM | POA: Diagnosis not present

## 2017-09-18 DIAGNOSIS — M5136 Other intervertebral disc degeneration, lumbar region: Secondary | ICD-10-CM | POA: Diagnosis not present

## 2017-09-18 DIAGNOSIS — M25552 Pain in left hip: Secondary | ICD-10-CM

## 2017-09-18 DIAGNOSIS — M1612 Unilateral primary osteoarthritis, left hip: Secondary | ICD-10-CM | POA: Diagnosis not present

## 2017-09-18 LAB — GLUCOSE, POCT (MANUAL RESULT ENTRY): POC Glucose: 94 mg/dL (ref 70–99)

## 2017-09-18 MED ORDER — PREDNISONE 20 MG PO TABS: ORAL_TABLET | ORAL | 0 refills | Status: DC

## 2017-09-18 NOTE — Progress Notes (Signed)
Subjective:    Patient ID: Ronald Harding, male    DOB: [birth year], 61 y.o.   MRN: [id number]  HPI Ronald Harding is a 61 y.o. male Presents today for: Chief Complaint  Patient presents with  . Hip Pain    left side    Presents with left hip pain.  Has been under care of Dr. Jordan Likes for trochanteric bursitis with appt 08/10/17 and 08/28/17. Injection provided at 08/10/17 to troch bursa. counseled on rehab and weight loss, with option of PT if not improved. OV 08/28/17 endorsed some symptoms of radicular symptoms to left leg. Was taking diclofenac with minimal improvement at that time. 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 Diclofenac was stopped, started Flexeril and plan for possible referral to physical therapy. Considered imaging if not improving.   Today felt like muscle relaxer helped quite a bit - helped more than anything else. Still having pain at backside of hip and buttock, but radiating pain into left calf over past week. Side/buttock pain present for 2-3 months. Occasional pain inside the left hip. NKI.   No bowel or bladder incontinence, no saddle anesthesia, no lower extremity weakness.   No known recent injury or fall, and no prior scatica diagnosis known.    Tx: tylenol  every 6 hours, ibuprofen  every 8 hours over past week. Ran out of flexeril over a week ago, but has refill available at pharmacy (  - every 8 hrs). Has also tried heat, ice packs - min relief.   Has been doing some exercises for troch bursitis.   Last took prednisone  for 5 days in September. Prediabetes, but not diabetic.  Lab Results  Component Value Date   HGBA1C 6.3 07/05/2017     Patient Active Problem List   Diagnosis Date Noted  . Greater trochanteric bursitis of left hip 08/10/2017  . Routine general medical examination at a health care facility 06/30/2016  . Thoracic ascending aortic aneurysm (HCC) 08/20/2015  . Essential  hypertension 06/30/2015  . Morbid obesity (HCC) 06/30/2015   Past Medical History:  Diagnosis Date  . Aortic aneurysm (HCC)    thoracic ascending  . Asthma    hx of-as a child  . History of colon polyps    benign  . Hypertension    takes Hyzaar daily  . Joint pain   . Pneumonia    hx of-in the 70's   Past Surgical History:  Procedure Laterality Date  . ANKLE SURGERY Right 2012  . COLONOSCOPY    . MULTIPLE EXTRACTIONS WITH ALVEOLOPLASTY N/A 09/23/2015   Procedure: Extraction of tooth #'s 1-12,14- 28, 30-32 with alveoloplasty, bilateral mandibular tori reductions and reduction of lateral exostoses.;  Surgeon: Charlynne Pander, DDS;  Location: [facility] OR;  Service: Oral Surgery;  Laterality: N/A;  . NOSE SURGERY  2013   Allergies  Allergen Reactions  . Betadine [Povidone Iodine] Hives    Pt states in 2012 in [place] he fell and broke several bones. Betadine broke him out.    Prior to Admission medications   Medication Sig Start Date End Date Taking? Authorizing Provider  acetaminophen (TYLENOL) 325 MG tablet Take 650 mg by mouth every 6 (six) hours as needed.   Yes [provider]  aspirin 81 MG tablet Take 81 mg by mouth daily.   Yes [provider]  cyclobenzaprine (FLEXERIL) 10 MG tablet TAKE 1 TABLET BY MOUTH 3 TIMES A DAY AS NEEDED FOR MUSCLE  SPASMS 09/17/17  Yes Myra Rude, MD  diclofenac (VOLTAREN) 75 MG EC tablet Take 75 mg 2 (two) times daily by mouth.  08/03/17  Yes [provider]  ibuprofen (ADVIL,MOTRIN) 800 MG tablet Take 800 mg by mouth every 8 (eight) hours as needed.   Yes [provider]  losartan-hydrochlorothiazide (HYZAAR) 50-12.5 MG tablet Take 1 tablet by mouth daily. 07/05/17  Yes Myrlene Broker, MD   Social History   Socioeconomic History  . Marital status: Married    Spouse name: Not on file  . Number of children: 2  . Years of education: Not on file  . Highest education level: Not on file  Social  Needs  . Financial resource strain: Not on file  . Food insecurity - worry: Not on file  . Food insecurity - inability: Not on file  . Transportation needs - medical: Not on file  . Transportation needs - non-medical: Not on file  Occupational History  . Not on file  Tobacco Use  . Smoking status: Former Games developer  . Smokeless tobacco: Current User  . Tobacco comment: one pack of chew per week  Substance and Sexual Activity  . Alcohol use: No    Alcohol/week: 0.0 oz  . Drug use: No  . Sexual activity: Not on file  Other Topics Concern  . Not on file  Social History Narrative   Married. 2 children. Disabled.    Review of Systems  Constitutional: Negative for chills, diaphoresis (no night sweats. ), fever and unexpected weight change.  Musculoskeletal: Positive for arthralgias and gait problem (Due to some discomfort on left posterior hip into leg at times.).  Skin: Negative for rash and wound.  Neurological: Negative for weakness and numbness.    Other per HPI.     Objective:   Physical Exam  Constitutional: He appears well-developed and well-nourished. No distress.  Overweight/obese.   HENT:  Head: Normocephalic and atraumatic.  Neck: Normal range of motion.  Pulmonary/Chest: Effort normal.  Abdominal: Soft. There is no tenderness.  Musculoskeletal: He exhibits tenderness.       Left hip: He exhibits normal strength, no tenderness and no bony tenderness.       Lumbar back: He exhibits decreased range of motion (min dec flexion and r lateral flexion. ). He exhibits no tenderness, no bony tenderness and no spasm.  Neurological: He is alert. He has normal strength. No sensory deficit.  Reflex Scores:      Patellar reflexes are 2+ on the right side and 2+ on the left side.      Achilles reflexes are 2+ on the right side and 2+ on the left side. Able to heel and toe walk without difficulty.  Skin: Skin is warm and dry.  Psychiatric: He has a normal mood and affect. His  behavior is normal.  Vitals reviewed.  Vitals:   09/18/17 1117  BP: 132/80  Pulse: 87  Resp: 17  Temp: 98.7 F (37.1 C)  TempSrc: Oral  SpO2: 98%  Weight: (!) 340 lb (154.2 kg)  Height: 5\' 11"  (1.803 m)   Dg Lumbar Spine Complete  Result Date: 09/18/2017 CLINICAL DATA:  60 year old male with a history of left posterior hip pain EXAM: LUMBAR SPINE - COMPLETE 4+ VIEW COMPARISON:  None. FINDINGS: Lumbar Spine: Lumbar vertebral elements maintain normal alignment without evidence of subluxation. No fracture line identified. 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 Greatest degree of disc space narrowing present at the L5-S1 level. Facet changes are most pronounced at L3-L4, L4-L5, L5-S1. Oblique images demonstrate no displaced pars defect. IMPRESSION: Negative for acute fracture malalignment of the lumbar spine. Early degenerative disc disease which is most pronounced at the L5-S1 level. Facet hypertrophy spanning L3-S1. Electronically Signed   By: Gilmer MorJaime  Wagner D.O.   On: 09/18/2017 12:24   Dg Hip Unilat W Or W/o Pelvis 2-3 Views Left  Result Date: 09/18/2017 CLINICAL DATA:  Left hip pain EXAM: DG HIP (WITH OR WITHOUT PELVIS) 2-3V LEFT COMPARISON:  None. FINDINGS: Mild joint space narrowing in the left hip. Negative for fracture mass or AVN. No pelvic lesion IMPRESSION: Mild joint space narrowing left hip joint without acute abnormality. Electronically Signed   By: Marlan Palauharles  Clark M.D.   On: 09/18/2017 12:45      Assessment & Plan:    Ronald Harding is a 61 y.o. male Left hip pain - Plan: DG HIP UNILAT W OR W/O PELVIS 2-3 VIEWS LEFT  Sciatic pain, left - Plan: DG Lumbar Spine Complete, predniSONE (DELTASONE) 20 MG tablet  Prediabetes - Plan: POCT glucose (manual entry)  May have combination of left hip pain with DJD as well as component of sciatica with radicular pain  through left leg. No apparent weakness, no red flags on exam or history. 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 He has not had significant improvement with over-the-counter anti-inflammatories, but did have some improvement with Flexeril. PT may also help, but would like to try other med for now given degree of discomfort.   -Options discussed including MRI, orthopedic eval, or initial trial of prednisone taper. He would like to try prednisone first   -9 day prednisone taper provider, potential side effects and risks were discussed. Avoid other NSAIDs during time of prednisone use. Okay to continue Flexeril, tylenol.   -Recheck in next 2 weeks if not improving, or if not worse at that time yet not improved could consider orthopedic referral without OV. RTC precautions if worsening sooner.  History of prediabetes, in office glucose okay for prednisone use.  Meds ordered this encounter  Medications  . predniSONE (DELTASONE) 20 MG tablet    Sig: 3 by mouth for 3 days, then 2 by mouth for 2 days, then 1 by mouth for 2 days, then 1/2 by mouth for 2 days.    Dispense:  16 tablet    Refill:  0   Patient Instructions    Stop voltaren and do not take over the counter NSAIDS while you are taking prednisone.  Tylenol is ok. Some of your symptoms may be related to sciatica with some arthritis in back.  Ok to continue flexeril if needed.  If pain is not improving in next 2 weeks (or worsening sooner), I would like you to see ortho. Let me know and I can refer you without an office visit as long as your symptoms are not worsening.   Return to the clinic or go to the nearest emergency room if any of your symptoms worsen or new symptoms occur.   Sciatica Sciatica is pain, numbness, weakness, or tingling along the path of the sciatic nerve. The sciatic nerve starts in the lower back and runs down the back of each leg. The nerve controls the muscles in the lower leg and in the back of the  knee. It also provides feeling (sensation) to the back of the thigh,  the lower leg, and the sole of the foot. Sciatica is a symptom of another medical condition that pinches or puts pressure on the sciatic nerve. Generally, sciatica only affects one side of the body. Sciatica usually goes away on its own or with treatment. In some cases, sciatica may keep coming back (recur). What are the causes? This condition is caused by pressure on the sciatic nerve, or pinching of the sciatic nerve. This may be the result of:  A disk in between the bones of the spine (vertebrae) bulging out too far (herniated disk).  Age-related changes in the spinal disks (degenerative disk disease).  A pain disorder that affects a muscle in the buttock (piriformis syndrome).  Extra bone growth (bone spur) near the sciatic nerve.  An injury or break (fracture) of the pelvis.  Pregnancy.  Tumor (rare).  What increases the risk? The following factors may make you more likely to develop this condition:  Playing sports that place pressure or stress on the spine, such as football or weight lifting.  Having poor strength and flexibility.  A history of back injury.  A history of back surgery.  Sitting for long periods of time.  Doing activities that involve repetitive bending or lifting.  Obesity.  What are the signs or symptoms? Symptoms can vary from mild to very severe, and they may include:  Any of these problems in the lower back, leg, hip, or buttock: ? Mild tingling or dull aches. ? Burning sensations. ? Sharp pains.  Numbness in the back of the calf or the sole of the foot.  Leg weakness.  Severe back pain that makes movement difficult.  These symptoms may get worse when you cough, sneeze, or laugh, or when you sit or stand for long periods of time. Being overweight may also make symptoms worse. In some cases, symptoms may recur over time. How is this diagnosed? This condition may be  diagnosed based on:  Your symptoms.  A physical exam. Your health care provider may ask you to do certain movements to check whether those movements trigger your symptoms.  You may have tests, including: ? Blood tests. ? X-rays. ? MRI. ? CT scan.  How is this treated? In many cases, this condition improves on its own, without any treatment. However, treatment may include:  Reducing or modifying physical activity during periods of pain.  Exercising and stretching to strengthen your abdomen and improve the flexibility of your spine.  Icing and applying heat to the affected area.  Medicines that help: ? To relieve pain and swelling. ? To relax your muscles.  Injections of medicines that help to relieve pain, irritation, and inflammation around the sciatic nerve (steroids).  Surgery.  Follow these instructions at home: Medicines  Take over-the-counter and prescription medicines only as told by your health care provider.  Do not drive or operate heavy machinery while taking prescription pain medicine. Managing pain  If directed, apply ice to the affected area. ? Put ice in a plastic bag. ? Place a towel between your skin and the bag. ? Leave the ice on for 20 minutes, 2-3 times a day.  After icing, apply heat to the affected area before you exercise or as often as told by your health care provider. Use the heat source that your health care provider recommends, such as a moist heat pack or a heating pad. ? Place a towel between your skin and the heat source. ? Leave the heat on for 20-30 minutes. ?  Remove the heat if your skin turns bright red. This is especially important if you are unable to feel pain, heat, or cold. You may have a greater risk of getting burned. Activity  Return to your normal activities as told by your health care provider. Ask your health care provider what activities are safe for you. ? Avoid activities that make your symptoms worse.  Take brief  periods of rest throughout the day. Resting in a lying or standing position is usually better than sitting to rest. ? When you rest for longer periods, mix in some mild activity or stretching between periods of rest. This will help to prevent stiffness and pain. ? Avoid sitting for long periods of time without moving. Get up and move around at least one time each hour.  Exercise and stretch regularly, as told by your health care provider.  Do not lift anything that is heavier than 10 lb (4.5 kg) while you have symptoms of sciatica. When you do not have symptoms, you should still avoid heavy lifting, especially repetitive heavy lifting.  When you lift objects, always use proper lifting technique, which includes: ? Bending your knees. ? Keeping the load close to your body. ? Avoiding twisting. General instructions  Use good posture. ? Avoid leaning forward while sitting. ? Avoid hunching over while standing.  Maintain a healthy weight. Excess weight puts extra stress on your back and makes it difficult to maintain good posture.  Wear supportive, comfortable shoes. Avoid wearing high heels.  Avoid sleeping on a mattress that is too soft or too hard. A mattress that is firm enough to support your back when you sleep may help to reduce your pain.  Keep all follow-up visits as told by your health care provider. This is important. Contact a health care provider if:  You have pain that wakes you up when you are sleeping.  You have pain that gets worse when you lie down.  Your pain is worse than you have experienced in the past.  Your pain lasts longer than 4 weeks.  You experience unexplained weight loss. Get help right away if:  You lose control of your bowel or bladder (incontinence).  You have: ? Weakness in your lower back, pelvis, buttocks, or legs that gets worse. ? Redness or swelling of your back. ? A burning sensation when you urinate. This information is not intended to  replace advice given to you by your health care provider. Make sure you discuss any questions you have with your health care provider. Document Released: 10/03/2001 Document Revised: 03/14/2016 Document Reviewed: 06/18/2015 Elsevier Interactive Patient Education  2017 [employer]  Hip Pain The hip is the joint between the upper legs and the lower pelvis. The bones, cartilage, tendons, and muscles of your hip joint support your body and allow you to move around. Hip pain can range from a minor ache to severe pain in one or both of your hips. The pain may be felt on the inside of the hip joint near the groin, or the outside near the buttocks and upper thigh. You may also have swelling or stiffness. Follow these instructions at home: Managing pain, stiffness, and swelling  If directed, apply ice to the injured area. ? Put ice in a plastic bag. ? Place a towel between your skin and the bag. ? Leave the ice on for 20 minutes, 2-3 times a day  Sleep with a pillow between your legs on your most comfortable side.  Avoid any  activities that cause pain. General instructions  Take over-the-counter and prescription medicines only as told by your health care provider.  Do any exercises as told by your health care provider.  Record the following: ? How often you have hip pain. ? The location of your pain. ? What the pain feels like. ? What makes the pain worse.  Keep all follow-up visits as told by your health care provider. This is important. Contact a health care provider if:  You cannot put weight on your leg.  Your pain or swelling continues or gets worse after one week.  It gets harder to walk.  You have a fever. Get help right away if:  You fall.  You have a sudden increase in pain and swelling in your hip.  Your hip is red or swollen or very tender to touch. Summary  Hip pain can range from a minor ache to severe pain in one or both of your hips.  The pain may be felt  on the inside of the hip joint near the groin, or the outside near the buttocks and upper thigh.  Avoid any activities that cause pain.  Record how often you have hip pain, the location of the pain, what makes it worse and what it feels like. This information is not intended to replace advice given to you by your health care provider. Make sure you discuss any questions you have with your health care provider. Document Released: 03/29/2010 Document Revised: 09/11/2016 Document Reviewed: 09/11/2016 Elsevier Interactive Patient Education  2017 [employer].    IF you received an x-ray today, you will receive an invoice from [facility]. Please contact [facility] at [phone] with questions or concerns regarding your invoice.   IF you received labwork today, you will receive an invoice from [facility]. Please contact [facility] at [phone] with questions or concerns regarding your invoice.   Our billing staff will not be able to assist you with questions regarding bills from these companies.  You will be contacted with the lab results as soon as they are available. The fastest way to get your results is to activate your My Chart account. Instructions are located on the last page of this paperwork. If you have not heard from [place] regarding the results in 2 weeks, please contact this office.       Signed,   Meredith Staggers, MD Primary Care at [facility].  09/18/17 1:01 PM

## 2017-09-18 NOTE — Patient Instructions (Addendum)
Stop voltaren and do not take over the counter NSAIDS while you are taking prednisone.  Tylenol is ok. Some of your symptoms may be related to sciatica with some arthritis in back.  Ok to continue flexeril if needed.  If pain is not improving in next 2 weeks (or worsening sooner), I would like you to see ortho. Let me know and I can refer you without an office visit as long as your symptoms are not worsening.   Return to the clinic or go to the nearest emergency room if any of your symptoms worsen or new symptoms occur.   Sciatica Sciatica is pain, numbness, weakness, or tingling along the path of the sciatic nerve. The sciatic nerve starts in the lower back and runs down the back of each leg. The nerve controls the muscles in the lower leg and in the back of the knee. It also provides feeling (sensation) to the back of the thigh, the lower leg, and the sole of the foot. Sciatica is a symptom of another medical condition that pinches or puts pressure on the sciatic nerve. Generally, sciatica only affects one side of the body. Sciatica usually goes away on its own or with treatment. In some cases, sciatica may keep coming back (recur). What are the causes? This condition is caused by pressure on the sciatic nerve, or pinching of the sciatic nerve. This may be the result of:  A disk in between the bones of the spine (vertebrae) bulging out too far (herniated disk).  Age-related changes in the spinal disks (degenerative disk disease).  A pain disorder that affects a muscle in the buttock (piriformis syndrome).  Extra bone growth (bone spur) near the sciatic nerve.  An injury or break (fracture) of the pelvis.  Pregnancy.  Tumor (rare).  What increases the risk? The following factors may make you more likely to develop this condition:  Playing sports that place pressure or stress on the spine, such as football or weight lifting.  Having poor strength and flexibility.  A history of back  injury.  A history of back surgery.  Sitting for long periods of time.  Doing activities that involve repetitive bending or lifting.  Obesity.  What are the signs or symptoms? Symptoms can vary from mild to very severe, and they may include:  Any of these problems in the lower back, leg, hip, or buttock: ? Mild tingling or dull aches. ? Burning sensations. ? Sharp pains.  Numbness in the back of the calf or the sole of the foot.  Leg weakness.  Severe back pain that makes movement difficult.  These symptoms may get worse when you cough, sneeze, or laugh, or when you sit or stand for long periods of time. Being overweight may also make symptoms worse. In some cases, symptoms may recur over time. How is this diagnosed? This condition may be diagnosed based on:  Your symptoms.  A physical exam. Your health care provider may ask you to do certain movements to check whether those movements trigger your symptoms.  You may have tests, including: ? Blood tests. ? X-rays. ? MRI. ? CT scan.  How is this treated? In many cases, this condition improves on its own, without any treatment. However, treatment may include:  Reducing or modifying physical activity during periods of pain.  Exercising and stretching to strengthen your abdomen and improve the flexibility of your spine.  Icing and applying heat to the affected area.  Medicines that help: ? To relieve  pain and swelling. ? To relax your muscles.  Injections of medicines that help to relieve pain, irritation, and inflammation around the sciatic nerve (steroids).  Surgery.  Follow these instructions at home: Medicines  Take over-the-counter and prescription medicines only as told by your health care provider.  Do not drive or operate heavy machinery while taking prescription pain medicine. Managing pain  If directed, apply ice to the affected area. ? Put ice in a plastic bag. ? Place a towel between your skin  and the bag. ? Leave the ice on for 20 minutes, 2-3 times a day.  After icing, apply heat to the affected area before you exercise or as often as told by your health care provider. Use the heat source that your health care provider recommends, such as a moist heat pack or a heating pad. ? Place a towel between your skin and the heat source. ? Leave the heat on for 20-30 minutes. ? Remove the heat if your skin turns bright red. This is especially important if you are unable to feel pain, heat, or cold. You may have a greater risk of getting burned. Activity  Return to your normal activities as told by your health care provider. Ask your health care provider what activities are safe for you. ? Avoid activities that make your symptoms worse.  Take brief periods of rest throughout the day. Resting in a lying or standing position is usually better than sitting to rest. ? When you rest for longer periods, mix in some mild activity or stretching between periods of rest. This will help to prevent stiffness and pain. ? Avoid sitting for long periods of time without moving. Get up and move around at least one time each hour.  Exercise and stretch regularly, as told by your health care provider.  Do not lift anything that is heavier than 10 lb (4.5 kg) while you have symptoms of sciatica. When you do not have symptoms, you should still avoid heavy lifting, especially repetitive heavy lifting.  When you lift objects, always use proper lifting technique, which includes: ? Bending your knees. ? Keeping the load close to your body. ? Avoiding twisting. General instructions  Use good posture. ? Avoid leaning forward while sitting. ? Avoid hunching over while standing.  Maintain a healthy weight. Excess weight puts extra stress on your back and makes it difficult to maintain good posture.  Wear supportive, comfortable shoes. Avoid wearing high heels.  Avoid sleeping on a mattress that is too soft or  too hard. A mattress that is firm enough to support your back when you sleep may help to reduce your pain.  Keep all follow-up visits as told by your health care provider. This is important. Contact a health care provider if:  You have pain that wakes you up when you are sleeping.  You have pain that gets worse when you lie down.  Your pain is worse than you have experienced in the past.  Your pain lasts longer than 4 weeks.  You experience unexplained weight loss. Get help right away if:  You lose control of your bowel or bladder (incontinence).  You have: ? Weakness in your lower back, pelvis, buttocks, or legs that gets worse. ? Redness or swelling of your back. ? A burning sensation when you urinate. This information is not intended to replace advice given to you by your health care provider. Make sure you discuss any questions you have with your health care provider. Document Released:  10/03/2001 Document Revised: 03/14/2016 Document Reviewed: 06/18/2015 Elsevier Interactive Patient Education  2017 [employer]vier Inc.  Hip Pain The hip is the joint between the upper legs and the lower pelvis. The bones, cartilage, tendons, and muscles of your hip joint support your body and allow you to move around. Hip pain can range from a minor ache to severe pain in one or both of your hips. The pain may be felt on the inside of the hip joint near the groin, or the outside near the buttocks and upper thigh. You may also have swelling or stiffness. Follow these instructions at home: Managing pain, stiffness, and swelling  If directed, apply ice to the injured area. ? Put ice in a plastic bag. ? Place a towel between your skin and the bag. ? Leave the ice on for 20 minutes, 2-3 times a day  Sleep with a pillow between your legs on your most comfortable side.  Avoid any activities that cause pain. General instructions  Take over-the-counter and prescription medicines only as told by your  health care provider.  Do any exercises as told by your health care provider.  Record the following: ? How often you have hip pain. ? The location of your pain. ? What the pain feels like. ? What makes the pain worse.  Keep all follow-up visits as told by your health care provider. This is important. Contact a health care provider if:  You cannot put weight on your leg.  Your pain or swelling continues or gets worse after one week.  It gets harder to walk.  You have a fever. Get help right away if:  You fall.  You have a sudden increase in pain and swelling in your hip.  Your hip is red or swollen or very tender to touch. Summary  Hip pain can range from a minor ache to severe pain in one or both of your hips.  The pain may be felt on the inside of the hip joint near the groin, or the outside near the buttocks and upper thigh.  Avoid any activities that cause pain.  Record how often you have hip pain, the location of the pain, what makes it worse and what it feels like. This information is not intended to replace advice given to you by your health care provider. Make sure you discuss any questions you have with your health care provider. Document Released: 03/29/2010 Document Revised: 09/11/2016 Document Reviewed: 09/11/2016 Elsevier Interactive Patient Education  2017 [employer]    IF you received an x-ray today, you will receive an invoice from [facility]. Please contact [facility] at [phone] with questions or concerns regarding your invoice.   IF you received labwork today, you will receive an invoice from [facility]. Please contact [facility] at [phone] with questions or concerns regarding your invoice.   Our billing staff will not be able to assist you with questions regarding bills from these companies.  You will be contacted with the lab results as soon as they are available. The fastest way to get your results is to activate your  My Chart account. Instructions are located on the last page of this paperwork. If you have not heard from us regarding the results in 2 weeks, please contact this office.

## 2017-10-08 ENCOUNTER — Telehealth: Payer: Self-pay | Admitting: Family Medicine

## 2017-10-08 DIAGNOSIS — M5432 Sciatica, left side: Secondary | ICD-10-CM

## 2017-10-08 NOTE — Telephone Encounter (Signed)
Left leg pain, left buttock and symptoms are better after prednisone, but still some continue pain that radiates to toes. Still somewhat limiting, but denies weakness, no incontinence. Not having to use cane/crutch at this point. Would like to see ortho and proceed with MRI. Orders placed.

## 2017-10-11 ENCOUNTER — Other Ambulatory Visit: Payer: Self-pay | Admitting: Family Medicine

## 2017-10-11 ENCOUNTER — Telehealth: Payer: Self-pay | Admitting: Family Medicine

## 2017-10-11 NOTE — Telephone Encounter (Signed)
Pt's insurance needed additional clinical info for prior auth for pt's MR LUMBAR SPINE WO CONTRAST sent clinical info, now waiting to hear their response..Marland Kitchen

## 2017-10-13 ENCOUNTER — Ambulatory Visit
Admission: RE | Admit: 2017-10-13 | Discharge: 2017-10-13 | Disposition: A | Discharge status: Home / Self Care | Payer: Medicare HMO | Source: Ambulatory Visit | Attending: Family Medicine | Admitting: Family Medicine

## 2017-10-13 DIAGNOSIS — M48061 Spinal stenosis, lumbar region without neurogenic claudication: Secondary | ICD-10-CM | POA: Diagnosis not present

## 2017-10-13 DIAGNOSIS — M5432 Sciatica, left side: Secondary | ICD-10-CM

## 2017-10-18 ENCOUNTER — Other Ambulatory Visit: Payer: Self-pay

## 2017-10-23 DIAGNOSIS — I719 Aortic aneurysm of unspecified site, without rupture: Secondary | ICD-10-CM

## 2017-10-23 HISTORY — DX: Aortic aneurysm of unspecified site, without rupture: I71.9

## 2017-10-25 ENCOUNTER — Other Ambulatory Visit: Payer: Self-pay | Admitting: Family Medicine

## 2017-10-25 ENCOUNTER — Telehealth: Payer: Self-pay | Admitting: Internal Medicine

## 2017-10-25 MED ORDER — TRAMADOL HCL 50 MG PO TABS: 50.0000 mg | ORAL_TABLET | Freq: Four times a day (QID) | ORAL | 0 refills | Status: DC | PRN

## 2017-10-25 NOTE — Telephone Encounter (Signed)
Copied from CRM [id number]. Topic: Quick Communication - See Telephone Encounter >> Oct 25, 2017  1:12 PM Herby AbrahamJohnson, Shiquita C wrote:   CRM for notification. See Telephone encounter for: pt called in because he has an apt with [facility] (referral) tomorrow at 10 am. Pt says that office informed him that they need a copy of the MRI that was completed. Pt was seen by Dr. Neva SeatGreene on 09/18/17. If someone could assist further.  Thanks.   10/25/17.

## 2017-10-25 NOTE — Progress Notes (Signed)
See MRI report - short term ultram provided if needed.

## 2017-10-26 DIAGNOSIS — M545 Low back pain: Secondary | ICD-10-CM | POA: Diagnosis not present

## 2017-10-26 DIAGNOSIS — M5416 Radiculopathy, lumbar region: Secondary | ICD-10-CM | POA: Diagnosis not present

## 2017-10-29 NOTE — Telephone Encounter (Signed)
[facility], patient's appointment was on 10/26/2017. Confirmation page received 2:26 pm

## 2017-11-05 DIAGNOSIS — M5416 Radiculopathy, lumbar region: Secondary | ICD-10-CM | POA: Diagnosis not present

## 2017-11-15 DIAGNOSIS — M5416 Radiculopathy, lumbar region: Secondary | ICD-10-CM | POA: Diagnosis not present

## 2017-11-22 ENCOUNTER — Other Ambulatory Visit: Payer: Self-pay | Admitting: Family Medicine

## 2017-11-29 DIAGNOSIS — M5416 Radiculopathy, lumbar region: Secondary | ICD-10-CM | POA: Diagnosis not present

## 2017-12-13 DIAGNOSIS — M25552 Pain in left hip: Secondary | ICD-10-CM | POA: Diagnosis not present

## 2017-12-13 DIAGNOSIS — M5416 Radiculopathy, lumbar region: Secondary | ICD-10-CM | POA: Diagnosis not present

## 2018-01-01 DIAGNOSIS — M5416 Radiculopathy, lumbar region: Secondary | ICD-10-CM | POA: Diagnosis not present

## 2018-01-04 ENCOUNTER — Other Ambulatory Visit: Payer: Self-pay | Admitting: Internal Medicine

## 2018-01-04 DIAGNOSIS — I7121 Aneurysm of the ascending aorta, without rupture: Secondary | ICD-10-CM

## 2018-01-04 DIAGNOSIS — I712 Thoracic aortic aneurysm, without rupture: Secondary | ICD-10-CM

## 2018-06-06 ENCOUNTER — Other Ambulatory Visit: Payer: Self-pay | Admitting: Cardiothoracic Surgery

## 2018-06-06 DIAGNOSIS — I712 Thoracic aortic aneurysm, without rupture, unspecified: Secondary | ICD-10-CM | POA: Insufficient documentation

## 2018-06-06 DIAGNOSIS — I7121 Aneurysm of the ascending aorta, without rupture: Secondary | ICD-10-CM

## 2018-07-17 ENCOUNTER — Other Ambulatory Visit: Payer: Self-pay

## 2018-07-17 ENCOUNTER — Ambulatory Visit: Payer: Self-pay | Admitting: Cardiothoracic Surgery

## 2018-07-23 ENCOUNTER — Ambulatory Visit
Admission: RE | Admit: 2018-07-23 | Discharge: 2018-07-23 | Disposition: A | Discharge status: Home / Self Care | Payer: Medicare HMO | Source: Ambulatory Visit | Attending: Cardiothoracic Surgery | Admitting: Cardiothoracic Surgery

## 2018-07-23 DIAGNOSIS — I712 Thoracic aortic aneurysm, without rupture, unspecified: Secondary | ICD-10-CM

## 2018-07-23 MED ORDER — IOPAMIDOL (ISOVUE-370) INJECTION 76%
75.0000 mL | Freq: Once | INTRAVENOUS | Status: AC | PRN
  Administered 2018-07-23: 75 mL via INTRAVENOUS

## 2018-07-24 ENCOUNTER — Ambulatory Visit: Payer: Medicare HMO | Admitting: Cardiothoracic Surgery

## 2018-07-24 ENCOUNTER — Encounter: Payer: Self-pay | Admitting: Cardiothoracic Surgery

## 2018-07-24 VITALS — BP 155/88 | HR 76 | Resp 20 | Ht 71.0 in | Wt 330.0 lb

## 2018-07-24 DIAGNOSIS — I712 Thoracic aortic aneurysm, without rupture, unspecified: Secondary | ICD-10-CM

## 2018-07-24 NOTE — Progress Notes (Signed)
PCP is Myrlene Broker, MD Referring Provider is Pecola Lawless, MD  Chief Complaint  Patient presents with  . Thoracic Aortic Aneurysm    1 year f/u with CTA Chest    HPI: One year follow-up for a moderate asymptomatic fusiform ascending aneurysm followed since 2018.  The patient has hypertension taking Cozaar/HCT but has not been completely compliant.  His blood pressure today is moderately elevated.  On CT scan the diameter of the ascending thoracic aorta is increased from 4.4 up to 4.6 cm.  There is no ulceration or mural thickening.  The patient states he occasionally checks his blood pressure with his digital cuff at home and the blood pressure usually runs 120 systolic.  Patient weighs 330 pounds.  This is down from 360 pounds a year ago. He is currently working full-time as a Surveyor, mining.  He has a slight 2/6 systolic ejection murmur and has not had a previous echo.  No other complaints.  Past Medical History:  Diagnosis Date  . Aortic aneurysm (HCC)    thoracic ascending  . Asthma    hx of-as a child  . History of colon polyps    benign  . Hypertension    takes Hyzaar daily  . Joint pain   . Pneumonia    hx of-in the 70's    Past Surgical History:  Procedure Laterality Date  . ANKLE SURGERY Right 2012  . COLONOSCOPY    . MULTIPLE EXTRACTIONS WITH ALVEOLOPLASTY N/A 09/23/2015   Procedure: Extraction of tooth #'s 1-12,14- 28, 30-32 with alveoloplasty, bilateral mandibular tori reductions and reduction of lateral exostoses.;  Surgeon: Charlynne Pander, DDS;  Location: [facility] OR;  Service: Oral Surgery;  Laterality: N/A;  . NOSE SURGERY  2013    Family History  Problem Relation Age of Onset  . Hypertension Mother   . Arthritis Mother   . Hypertension Father   . Heart disease Father   . Arthritis Father     Social History Social History   Tobacco Use  . Smoking status: Former Games developer  . Smokeless tobacco: Current User  . Tobacco comment: one pack of  chew per week  Substance Use Topics  . Alcohol use: No    Alcohol/week: 0.0 standard drinks  . Drug use: No    Current Outpatient Medications  Medication Sig Dispense Refill  . aspirin 81 MG tablet Take 81 mg by mouth daily.    Marland Kitchen losartan-hydrochlorothiazide (HYZAAR) 50-12.5 MG tablet TAKE ONE TABLET BY MOUTH ONE TIME DAILY  90 tablet 1   No current facility-administered medications for this visit.     Allergies  Allergen Reactions  . Betadine [Povidone Iodine] Hives    Pt states in 2012 in [place] he fell and broke several bones. Betadine broke him out.     Review of Systems   Obesity trying to lose weight Arthritic pain in his right ankle following an old fracture No syncope chest pain palpitations No difficulty swallowing Chronic edema of his right lower extremity  BP (!) 155/88   Pulse 76   Resp 20   Ht 5\' 11"  (1.803 m)   Wt (!) 330 lb (149.7 kg)   SpO2 97% Comment: RA  BMI 46.03 kg/m  Physical Exam      Exam    General- alert and comfortable    Neck- no JVD, no cervical adenopathy palpable, no carotid bruit   Lungs- clear without rales, wheezes   Cor- regular rate and rhythm, 2/6  SEM murmur , no gallop   Abdomen- soft, non-tender   Extremities - warm, non-tender,  2+edema R ankle- chronic   Neuro- oriented, appropriate, no focal weakness   Diagnostic Tests: CT scan images performed yesterday personally reviewed and counseled patient Moderate fusiform ascending aneurysm slightly increased to 4.6 cm diameter.  Last year was 4.4 cm.  Difference is not significant.  Continue with blood pressure medication and serial scans Impression: Asymptomatic moderate fusiform aneurysm, stable Importance of compliance with blood pressure medication emphasized to the patient  Plan: Continue with annual surveillance CT scan of thoracic aorta.  Take blood pressure medications daily.  Continue to check blood pressures at home.  Systolic blood pressure consistently greater  than 140 mmHg should be treated with addition of a beta-blocker Return October 2020 with CTA of thoracic aorta.  Echocardiogram before next visit.  Mikey Bussing, MD [facility] [phone]

## 2018-07-26 ENCOUNTER — Encounter: Payer: Self-pay | Admitting: Internal Medicine

## 2018-07-26 ENCOUNTER — Other Ambulatory Visit (INDEPENDENT_AMBULATORY_CARE_PROVIDER_SITE_OTHER): Payer: Medicare HMO

## 2018-07-26 ENCOUNTER — Ambulatory Visit (INDEPENDENT_AMBULATORY_CARE_PROVIDER_SITE_OTHER): Payer: Medicare HMO | Admitting: Internal Medicine

## 2018-07-26 VITALS — BP 138/80 | HR 68 | Temp 98.4°F | Ht 71.0 in | Wt 330.0 lb

## 2018-07-26 DIAGNOSIS — I10 Essential (primary) hypertension: Secondary | ICD-10-CM

## 2018-07-26 DIAGNOSIS — I7121 Aneurysm of the ascending aorta, without rupture: Secondary | ICD-10-CM

## 2018-07-26 DIAGNOSIS — Z23 Encounter for immunization: Secondary | ICD-10-CM

## 2018-07-26 DIAGNOSIS — Z0000 Encounter for general adult medical examination without abnormal findings: Secondary | ICD-10-CM

## 2018-07-26 DIAGNOSIS — I712 Thoracic aortic aneurysm, without rupture: Secondary | ICD-10-CM | POA: Diagnosis not present

## 2018-07-26 LAB — COMPREHENSIVE METABOLIC PANEL WITH GFR
ALT: 15 U/L (ref 0–53)
AST: 16 U/L (ref 0–37)
Albumin: 3.9 g/dL (ref 3.5–5.2)
Alkaline Phosphatase: 95 U/L (ref 39–117)
BUN: 16 mg/dL (ref 6–23)
CO2: 30 meq/L (ref 19–32)
Calcium: 9.2 mg/dL (ref 8.4–10.5)
Chloride: 104 meq/L (ref 96–112)
Creatinine, Ser: 0.9 mg/dL (ref 0.40–1.50)
GFR: 109.94 mL/min
Glucose, Bld: 85 mg/dL (ref 70–99)
Potassium: 4.2 meq/L (ref 3.5–5.1)
Sodium: 139 meq/L (ref 135–145)
Total Bilirubin: 0.5 mg/dL (ref 0.2–1.2)
Total Protein: 7.1 g/dL (ref 6.0–8.3)

## 2018-07-26 LAB — PSA: PSA: 1.4 ng/mL (ref 0.10–4.00)

## 2018-07-26 LAB — LIPID PANEL
Cholesterol: 122 mg/dL (ref 0–200)
HDL: 35.2 mg/dL — ABNORMAL LOW
LDL Cholesterol: 67 mg/dL (ref 0–99)
NonHDL: 86.85
Total CHOL/HDL Ratio: 3
Triglycerides: 101 mg/dL (ref 0.0–149.0)
VLDL: 20.2 mg/dL (ref 0.0–40.0)

## 2018-07-26 LAB — CBC
HCT: 41.2 % (ref 39.0–52.0)
Hemoglobin: 13.8 g/dL (ref 13.0–17.0)
MCHC: 33.5 g/dL (ref 30.0–36.0)
MCV: 86.2 fl (ref 78.0–100.0)
Platelets: 383 10*3/uL (ref 150.0–400.0)
RBC: 4.78 Mil/uL (ref 4.22–5.81)
RDW: 15 % (ref 11.5–15.5)
WBC: 6.5 10*3/uL (ref 4.0–10.5)

## 2018-07-26 LAB — HEMOGLOBIN A1C: Hgb A1c MFr Bld: 6 % (ref 4.6–6.5)

## 2018-07-26 MED ORDER — LOSARTAN POTASSIUM-HCTZ 50-12.5 MG PO TABS: 1.0000 | ORAL_TABLET | Freq: Every day | ORAL | 3 refills | Status: DC

## 2018-07-26 NOTE — Assessment & Plan Note (Signed)
Weight is increasing, he is walking some. We talked about dietary changes but he is not interested today.

## 2018-07-26 NOTE — Patient Instructions (Signed)

## 2018-07-26 NOTE — Assessment & Plan Note (Signed)
Taking losartan/hctz 50/12.5 mg daily. Refilled and checking CMP and adjust if needed.

## 2018-07-26 NOTE — Progress Notes (Signed)
   Subjective:    Patient ID: Ronald Harding, male    DOB: 05-21-1956, 62 y.o.   MRN: [id number]  HPI Here for medicare wellness and physical, no new complaints. Please see A/P for status and treatment of chronic medical problems.   Diet: heart healthy Physical activity: sedentary Depression/mood screen: negative Hearing: intact to whispered voice Visual acuity: grossly normal, performs annual eye exam  ADLs: capable Fall risk: none Home safety: good Cognitive evaluation: intact to orientation, naming, recall and repetition EOL planning: adv directives discussed  I have personally reviewed and have noted 1. The patient's medical and social history - reviewed today no changes 2. Their use of alcohol, tobacco or illicit drugs 3. Their current medications and supplements 4. The patient's functional ability including ADL's, fall risks, home safety risks and hearing or visual impairment. 5. Diet and physical activities 6. Evidence for depression or mood disorders 7. Care team reviewed and updated (available in snapshot)  Review of Systems  Constitutional: Negative.   HENT: Negative.   Eyes: Negative.   Respiratory: Negative for cough, chest tightness and shortness of breath.   Cardiovascular: Negative for chest pain, palpitations and leg swelling.  Gastrointestinal: Negative for abdominal distention, abdominal pain, constipation, diarrhea, nausea and vomiting.  Musculoskeletal: Negative.   Skin: Negative.   Neurological: Negative.   Psychiatric/Behavioral: Negative.       Objective:   Physical Exam  Constitutional: He is oriented to person, place, and time. He appears well-developed and well-nourished.  overweight  HENT:  Head: Normocephalic and atraumatic.  Eyes: EOM are normal.  Neck: Normal range of motion.  Cardiovascular: Normal rate and regular rhythm.  Pulmonary/Chest: Effort normal and breath sounds normal. No respiratory distress. He has no wheezes. He has no  rales.  Abdominal: Soft. Bowel sounds are normal. He exhibits no distension. There is no tenderness. There is no rebound.  Musculoskeletal: He exhibits no edema.  Neurological: He is alert and oriented to person, place, and time. Coordination normal.  Skin: Skin is warm and dry.  Psychiatric: He has a normal mood and affect.   Vitals:   07/26/18 0917  BP: 138/80  Pulse: 68  Temp: 98.4 F (36.9 C)  TempSrc: Oral  SpO2: 96%  Weight: (!) 330 lb (149.7 kg)  Height: 5\' 11"  (1.803 m)      Assessment & Plan:  Flu shot given at visit

## 2018-07-26 NOTE — Assessment & Plan Note (Signed)
Flu shot given. Pneumonia not indicated. Shingrix declines. Tetanus up to date. Colonoscopy up to date. Counseled about sun safety and mole surveillance. Counseled about the dangers of distracted driving. Given 10 year screening recommendations.

## 2018-08-27 ENCOUNTER — Other Ambulatory Visit: Payer: Self-pay | Admitting: Gastroenterology

## 2018-08-27 DIAGNOSIS — K641 Second degree hemorrhoids: Secondary | ICD-10-CM | POA: Diagnosis not present

## 2018-08-27 DIAGNOSIS — Z1211 Encounter for screening for malignant neoplasm of colon: Secondary | ICD-10-CM | POA: Diagnosis not present

## 2018-08-27 DIAGNOSIS — Z86010 Personal history of colonic polyps: Secondary | ICD-10-CM | POA: Diagnosis not present

## 2018-09-24 ENCOUNTER — Ambulatory Visit (HOSPITAL_COMMUNITY): Admit: 2018-09-24 | Payer: Medicare HMO | Admitting: Gastroenterology

## 2018-09-24 ENCOUNTER — Encounter (HOSPITAL_COMMUNITY): Payer: Self-pay

## 2018-09-24 SURGERY — COLONOSCOPY WITH PROPOFOL
Anesthesia: Monitor Anesthesia Care

## 2018-10-05 ENCOUNTER — Ambulatory Visit (INDEPENDENT_AMBULATORY_CARE_PROVIDER_SITE_OTHER): Payer: Medicare HMO | Admitting: Family Medicine

## 2018-10-05 VITALS — BP 150/90 | HR 73 | Temp 98.3°F | Resp 16 | Wt 336.0 lb

## 2018-10-05 DIAGNOSIS — J209 Acute bronchitis, unspecified: Secondary | ICD-10-CM

## 2018-10-05 NOTE — Progress Notes (Signed)
Subjective:     Patient ID: Ronald Harding, male   DOB: March 28, 1956, 61 y.o.   MRN: [id number]  HPI Patient presents with cough for the past week or so.  He is non-smoker.  His wife has had somewhat similar symptoms.  Cough is mostly dry.  He denies any wheezing.  No dyspnea.  No fevers or chills.  Only minimal nasal congestion.  Took some castor oil and thinks that may have helped  Past Medical History:  Diagnosis Date  . Aortic aneurysm (HCC)    thoracic ascending  . Asthma    hx of-as a child  . History of colon polyps    benign  . Hypertension    takes Hyzaar daily  . Joint pain   . Pneumonia    hx of-in the 70's   Past Surgical History:  Procedure Laterality Date  . ANKLE SURGERY Right 2012  . COLONOSCOPY    . MULTIPLE EXTRACTIONS WITH ALVEOLOPLASTY N/A 09/23/2015   Procedure: Extraction of tooth #'s 1-12,14- 28, 30-32 with alveoloplasty, bilateral mandibular tori reductions and reduction of lateral exostoses.;  Surgeon: Charlynne Pander, DDS;  Location: [facility] OR;  Service: Oral Surgery;  Laterality: N/A;  . NOSE SURGERY  2013    reports that he has quit smoking. He uses smokeless tobacco. He reports that he does not drink alcohol or use drugs. family history includes Arthritis in his father and mother; Heart disease in his father; Hypertension in his father and mother. Allergies  Allergen Reactions  . Betadine [Povidone Iodine] Hives    Pt states in 2012 in [place] he fell and broke several bones. Betadine broke him out.      Review of Systems  Constitutional: Negative for chills and fever.  Respiratory: Positive for cough. Negative for wheezing.   Cardiovascular: Negative for chest pain.       Objective:   Physical Exam Constitutional:      Appearance: Normal appearance.  Neck:     Musculoskeletal: Neck supple.  Cardiovascular:     Rate and Rhythm: Normal rate and regular rhythm.  Neurological:     Mental Status: He is alert.        Assessment:      Cough.  Suspect acute viral bronchitis.  Nonfocal exam    Plan:     Reassurance. Follow-up promptly for any fever, increased shortness of breath, or other concerns -OTC cough medications as needed.  Ronald Covey MD [facility]

## 2018-10-05 NOTE — Patient Instructions (Signed)

## 2018-10-17 ENCOUNTER — Ambulatory Visit: Payer: Self-pay

## 2018-10-17 NOTE — Telephone Encounter (Signed)
Wife called on pt's behalf; reported he has had a cough for about one month, that is worsening.  C/o coughing up yellow phlegm, and intermittent shortness of breath.  Stated she has given him doses of her inhaler to help with the shortness of breath.  Denied c/o chest pain or tightness, wheezing, or fever/ chills.  Reported that the pt. Has not been able to sleep at night, due to freq. Of cough, and shortness of breath with laying down.  St[place] at [place], and are requesting an appt. Tomorrow.  Appt. Given with PCP 12/27. Care advice given per protocol.  Advised to go to UC or ER, if sx's become worse.  Verb. Understanding; agreed with plan.       Reason for Disposition . SEVERE coughing spells (e.g., whooping sound after coughing, vomiting after coughing)  Answer Assessment - Initial Assessment Questions 1. ONSET: "When did the cough begin?"      About one month  2. SEVERITY: "How bad is the cough today?"      Cough is getting worse 3. RESPIRATORY DISTRESS: "Describe your breathing."      Shortness of breath with coughing episodes; not able to sleep at night due to cough and laying down  4. FEVER: "Do you have a fever?" If so, ask: "What is your temperature, how was it measured, and when did it start?"     Denied fever/ chills  5. SPUTUM: "Describe the color of your sputum" (e.g., clear, white, yellow, green), "Has there been any change recently?"     Yellow phlegm  6. HEMOPTYSIS: "Are you coughing up any blood?" If so ask: "How much, flecks, streaks, tablespoons, etc.?"     no 7. CARDIAC HISTORY: "Do you have any history of heart disease?" (e.g., heart attack, congestive heart failure)      denied  8. LUNG HISTORY: "Do you have any history of lung disease?"  (e.g., pulmonary embolus, asthma, emphysema/COPD)    Denied  9. OTHER SYMPTOMS: "Do you have any other symptoms? (e.g., runny nose, wheezing, chest pain)     Denied wheezing; denied chest pain or  tightness 10. PREGNANCY: "Is there any chance you are pregnant?" "When was your last menstrual period?"       N/a  11. TRAVEL: "Have you traveled out of the country in the last month?" (e.g., travel history, exposures)      No  Protocols used: COUGH - CHRONIC-A-AH

## 2018-10-18 ENCOUNTER — Encounter: Payer: Self-pay | Admitting: Internal Medicine

## 2018-10-18 ENCOUNTER — Ambulatory Visit (INDEPENDENT_AMBULATORY_CARE_PROVIDER_SITE_OTHER): Payer: Medicare HMO | Admitting: Internal Medicine

## 2018-10-18 ENCOUNTER — Ambulatory Visit (INDEPENDENT_AMBULATORY_CARE_PROVIDER_SITE_OTHER)
Admission: RE | Admit: 2018-10-18 | Discharge: 2018-10-18 | Disposition: A | Discharge status: Home / Self Care | Payer: Medicare HMO | Source: Ambulatory Visit | Attending: Internal Medicine | Admitting: Internal Medicine

## 2018-10-18 VITALS — BP 130/78 | HR 74 | Temp 98.6°F | Ht 71.0 in | Wt 340.0 lb

## 2018-10-18 DIAGNOSIS — R059 Cough, unspecified: Secondary | ICD-10-CM

## 2018-10-18 DIAGNOSIS — R05 Cough: Secondary | ICD-10-CM | POA: Diagnosis not present

## 2018-10-18 LAB — POCT EXHALED NITRIC OXIDE: FeNO level (ppb): 24

## 2018-10-18 MED ORDER — ALBUTEROL SULFATE HFA 108 (90 BASE) MCG/ACT IN AERS: 2.0000 | INHALATION_SPRAY | Freq: Four times a day (QID) | RESPIRATORY_TRACT | 2 refills | Status: DC | PRN

## 2018-10-18 MED ORDER — DOXYCYCLINE HYCLATE 100 MG PO TABS: 100.0000 mg | ORAL_TABLET | Freq: Two times a day (BID) | ORAL | 0 refills | Status: DC

## 2018-10-18 NOTE — Assessment & Plan Note (Addendum)
CXR ordered today. POC FENO done which shows low 24. Rx for doxycycline today and no indication for steroids.

## 2018-10-18 NOTE — Addendum Note (Signed)
Addended by: Berton LanGULCH, BRIANA R on: 10/18/2018 10:23 AM   Modules accepted: Orders

## 2018-10-18 NOTE — Patient Instructions (Addendum)
The test shows that you would not respond to prednisone.   We will do the chest x-ray today and have called in an antibiotic called doxycycline. Take 1 pill twice a day for 1 week.   We have sent in the inhaler to use if needed.

## 2018-10-18 NOTE — Progress Notes (Signed)
   Subjective:   Patient ID: Ronald Harding, male    DOB: 04/27/1956, 62 y.o.   MRN: [id number]  HPI The patient is a 62 y.o. man coming in for cold symptoms. Started 1 month ago. Main symptoms are: cough with some SOB. Denies fevers or chills or chest pains. Overall it is worsening. Has tried cough syrup otc and and albuterol inhaler he borrowed from a relative. The inhaler did help some with breathing. Cough is productive at night time. Still non-smoker although former smoker.   Review of Systems  Constitutional: Negative.   HENT: Negative.   Eyes: Negative.   Respiratory: Positive for cough and shortness of breath. Negative for chest tightness.   Cardiovascular: Negative for chest pain, palpitations and leg swelling.  Gastrointestinal: Negative for abdominal distention, abdominal pain, constipation, diarrhea, nausea and vomiting.  Musculoskeletal: Negative.   Skin: Negative.   Neurological: Negative.   Psychiatric/Behavioral: Negative.     Objective:  Physical Exam Constitutional:      Appearance: He is well-developed.  HENT:     Head: Normocephalic and atraumatic.     Nose: Congestion present.     Mouth/Throat:     Mouth: Mucous membranes are moist.     Pharynx: Oropharynx is clear. Posterior oropharyngeal erythema present.  Neck:     Musculoskeletal: Normal range of motion.  Cardiovascular:     Rate and Rhythm: Normal rate and regular rhythm.  Pulmonary:     Effort: No respiratory distress.     Breath sounds: Normal breath sounds. No wheezing or rales.     Comments: Poor effort Abdominal:     General: Bowel sounds are normal. There is no distension.     Palpations: Abdomen is soft.     Tenderness: There is no abdominal tenderness. There is no rebound.  Skin:    General: Skin is warm and dry.  Neurological:     Mental Status: He is alert and oriented to person, place, and time.     Coordination: Coordination normal.     Vitals:   10/18/18 0949  BP: 130/78    Pulse: 74  Temp: 98.6 F (37 C)  TempSrc: Oral  SpO2: 96%  Weight: (!) 340 lb (154.2 kg)  Height: 5\' 11"  (1.803 m)   FENO: 24  Assessment & Plan:

## 2018-10-25 ENCOUNTER — Ambulatory Visit: Payer: Self-pay | Admitting: *Deleted

## 2018-10-25 ENCOUNTER — Ambulatory Visit (INDEPENDENT_AMBULATORY_CARE_PROVIDER_SITE_OTHER): Payer: Medicare HMO | Admitting: Internal Medicine

## 2018-10-25 ENCOUNTER — Encounter: Payer: Self-pay | Admitting: Internal Medicine

## 2018-10-25 VITALS — BP 124/60 | HR 75 | Temp 98.3°F | Resp 18 | Ht 71.0 in | Wt 334.0 lb

## 2018-10-25 DIAGNOSIS — R05 Cough: Secondary | ICD-10-CM

## 2018-10-25 DIAGNOSIS — R059 Cough, unspecified: Secondary | ICD-10-CM

## 2018-10-25 MED ORDER — AMOXICILLIN-POT CLAVULANATE 875-125 MG PO TABS: 1.0000 | ORAL_TABLET | Freq: Two times a day (BID) | ORAL | 0 refills | Status: DC

## 2018-10-25 NOTE — Assessment & Plan Note (Signed)
His exam is unremarkable He has been seen for this twice in the past-recent chest x-ray is negative Cough is worse at night-?  Postnasal drainage more silent GERD could be influencing some of the symptoms Start Claritin daily Has had intermittent sinus pressure-we will give Augmentin for possible sinus infection, but if symptoms persist then this is not bacterial in nature Over-the-counter Robitussin as needed Call if no improvement-if his cough continues may need to consider PPI

## 2018-10-25 NOTE — Telephone Encounter (Signed)
Seeing you at 10:45 

## 2018-10-25 NOTE — Progress Notes (Signed)
Subjective:    Patient ID: Ronald Harding, male    DOB: 02/02/1956, 63 y.o.   MRN: [id number]  HPI He is here for an acute visit for cold symptoms.  His symptoms started over one month.  He was seen 12/14 and 12/27.  He completed doxycycline yesterday, which she took for 1 week.  He states this did help a little at first, but then did not seem to help.  He was also prescribed an inhaler and that may have helped a little, but he is still coughing.  He is experiencing cough that is worse at night.  He coughs some during the day, but last night he coughed for 45 minutes.  He can hear gurgling inside his chest when he knows that there is phlegm in there-he is not able to get up.  He has had a little wheezing at night.  He has had some intermittent sinus pressure.  He denies any fevers, chills, shortness of breath, postnasal drip, sinus pain, nasal congestion or headaches.         Medications and allergies reviewed with patient and updated if appropriate.  Patient Active Problem List   Diagnosis Date Noted  . Thoracic aortic aneurysm without rupture (HCC) 06/06/2018  . Greater trochanteric bursitis of left hip 08/10/2017  . Routine general medical examination at a health care facility 06/30/2016  . Thoracic ascending aortic aneurysm (HCC) 08/20/2015  . Essential hypertension 06/30/2015  . Morbid obesity (HCC) 06/30/2015  . Cough 06/30/2015    Current Outpatient Medications on File Prior to Visit  Medication Sig Dispense Refill  . albuterol (PROVENTIL HFA;VENTOLIN HFA) 108 (90 Base) MCG/ACT inhaler Inhale 2 puffs into the lungs every 6 (six) hours as needed for wheezing or shortness of breath. 1 Inhaler 2  . aspirin 81 MG tablet Take 81 mg by mouth daily.    Marland Kitchen. losartan-hydrochlorothiazide (HYZAAR) 50-12.5 MG tablet Take 1 tablet by mouth daily. 90 tablet 3   No current facility-administered medications on file prior to visit.     Past Medical History:  Diagnosis Date  . Aortic  aneurysm (HCC)    thoracic ascending  . Asthma    hx of-as a child  . History of colon polyps    benign  . Hypertension    takes Hyzaar daily  . Joint pain   . Pneumonia    hx of-in the 70's    Past Surgical History:  Procedure Laterality Date  . ANKLE SURGERY Right 2012  . COLONOSCOPY    . MULTIPLE EXTRACTIONS WITH ALVEOLOPLASTY N/A 09/23/2015   Procedure: Extraction of tooth #'s 1-12,14- 28, 30-32 with alveoloplasty, bilateral mandibular tori reductions and reduction of lateral exostoses.;  Surgeon: Charlynne Panderonald F Kulinski, DDS;  [facility];  Service: Oral Surgery;  Laterality: N/A;  . NOSE SURGERY  2013    Social History   Socioeconomic History  . Marital status: Married    Spouse name: Not on file  . Number of children: 2  . Years of education: Not on file  . Highest education level: Not on file  Occupational History  . Not on file  Social Needs  . Financial resource strain: Not on file  . Food insecurity:    Worry: Not on file    Inability: Not on file  . Transportation needs:    Medical: Not on file    Non-medical: Not on file  Tobacco Use  . Smoking status: Former Games developermoker  . Smokeless tobacco: Current User  .  Tobacco comment: one pack of chew per week  Substance and Sexual Activity  . Alcohol use: No    Alcohol/week: 0.0 standard drinks  . Drug use: No  . Sexual activity: Not on file  Lifestyle  . Physical activity:    Days per week: Not on file    Minutes per session: Not on file  . Stress: Not on file  Relationships  . Social connections:    Talks on phone: Not on file    Gets together: Not on file    Attends religious service: Not on file    Active member of club or organization: Not on file    Attends meetings of clubs or organizations: Not on file    Relationship status: Not on file  Other Topics Concern  . Not on file  Social History Narrative   Married. 2 children. Disabled.    Family History  Problem Relation Age of Onset  .  Hypertension Mother   . Arthritis Mother   . Hypertension Father   . Heart disease Father   . Arthritis Father     Review of Systems  Constitutional: Negative for chills and fever.  HENT: Positive for sinus pressure (sometimes). Negative for congestion, ear pain, postnasal drip, sinus pain and sore throat.   Respiratory: Positive for cough (worse at night, sometimes productive) and wheezing (? a little at night ). Negative for chest tightness and shortness of breath.   Gastrointestinal:       No gerd  Neurological: Negative for dizziness, light-headedness and headaches.       Objective:   Vitals:   10/25/18 1048  BP: 124/60  Pulse: 75  Resp: 18  Temp: 98.3 F (36.8 C)  SpO2: 98%   BP Readings from Last 3 Encounters:  10/25/18 124/60  10/18/18 130/78  10/05/18 (!) 150/90   Wt Readings from Last 3 Encounters:  10/25/18 (!) 334 lb (151.5 kg)  10/18/18 (!) 340 lb (154.2 kg)  10/05/18 (!) 336 lb (152.4 kg)   Body mass index is 46.58 kg/m.   Physical Exam    GENERAL APPEARANCE: Appears stated age, well appearing, NAD EYES: conjunctiva clear, no icterus HEENT: bilateral tympanic membranes and ear canals normal, oropharynx with no erythema, no thyromegaly, trachea midline, no cervical or supraclavicular lymphadenopathy LUNGS: Clear to auscultation without wheeze or crackles, unlabored breathing, good air entry bilaterally CARDIOVASCULAR: Normal S1,S2 without murmurs, no edema SKIN: Warm, dry      Assessment & Plan:    See Problem List for Assessment and Plan of chronic medical problems.

## 2018-10-25 NOTE — Patient Instructions (Signed)
Take allegra or claritin daily for two weeks.   Take the antibiotic - augmentin - complete the entire course.    Take robitussion as needed for your cough.    Return if no improvement.

## 2018-10-25 NOTE — Telephone Encounter (Signed)
Cough for over 1 month- last night so bad he could not sleep. Patient has been using cough syrup - over the counter- not helping.Cough has lingered on after antibiotic treatment. Patient is not able to sleep at night and he is wheezing- cough has lingered after treatment.  Reason for Disposition . Wheezing is present  Answer Assessment - Initial Assessment Questions 1. ONSET: "When did the cough begin?"      1 month ago 2. SEVERITY: "How bad is the cough today?"      Kept him up over night 3. RESPIRATORY DISTRESS: "Describe your breathing."      Patient had to use inhaler- twice daily 4. FEVER: "Do you have a fever?" If so, ask: "What is your temperature, how was it measured, and when did it start?"     No fever 5. SPUTUM: "Describe the color of your sputum" (clear, white, yellow, green)     yellow 6. HEMOPTYSIS: "Are you coughing up any blood?" If so ask: "How much?" (flecks, streaks, tablespoons, etc.)     No blood 7. CARDIAC HISTORY: "Do you have any history of heart disease?" (e.g., heart attack, congestive heart failure)      anurism 8. LUNG HISTORY: "Do you have any history of lung disease?"  (e.g., pulmonary embolus, asthma, emphysema)     no 9. PE RISK FACTORS: "Do you have a history of blood clots?" (or: recent major surgery, recent prolonged travel, bedridden)     no 10. OTHER SYMPTOMS: "Do you have any other symptoms?" (e.g., runny nose, wheezing, chest pain)       Wheezing 11. PREGNANCY: "Is there any chance you are pregnant?" "When was your last menstrual period?"       n/a 12. TRAVEL: "Have you traveled out of the country in the last month?" (e.g., travel history, exposures)       no  Protocols used: COUGH - ACUTE PRODUCTIVE-A-AH

## 2019-01-17 ENCOUNTER — Other Ambulatory Visit: Payer: Self-pay

## 2019-01-17 ENCOUNTER — Inpatient Hospital Stay (HOSPITAL_COMMUNITY)
Admission: EM | Admit: 2019-01-17 | Discharge: 2019-01-21 | DRG: 389 | Disposition: A | Discharge status: Home / Self Care | Payer: Medicare HMO | Attending: Surgery | Admitting: Surgery

## 2019-01-17 ENCOUNTER — Ambulatory Visit: Payer: Self-pay

## 2019-01-17 ENCOUNTER — Emergency Department (HOSPITAL_COMMUNITY): Payer: Medicare HMO

## 2019-01-17 ENCOUNTER — Encounter (HOSPITAL_COMMUNITY): Payer: Self-pay

## 2019-01-17 DIAGNOSIS — Z9181 History of falling: Secondary | ICD-10-CM

## 2019-01-17 DIAGNOSIS — Z888 Allergy status to other drugs, medicaments and biological substances status: Secondary | ICD-10-CM

## 2019-01-17 DIAGNOSIS — R1110 Vomiting, unspecified: Secondary | ICD-10-CM | POA: Diagnosis not present

## 2019-01-17 DIAGNOSIS — I712 Thoracic aortic aneurysm, without rupture: Secondary | ICD-10-CM | POA: Diagnosis present

## 2019-01-17 DIAGNOSIS — Z79899 Other long term (current) drug therapy: Secondary | ICD-10-CM

## 2019-01-17 DIAGNOSIS — K56609 Unspecified intestinal obstruction, unspecified as to partial versus complete obstruction: Secondary | ICD-10-CM | POA: Diagnosis present

## 2019-01-17 DIAGNOSIS — F17220 Nicotine dependence, chewing tobacco, uncomplicated: Secondary | ICD-10-CM | POA: Diagnosis present

## 2019-01-17 DIAGNOSIS — J45909 Unspecified asthma, uncomplicated: Secondary | ICD-10-CM | POA: Diagnosis present

## 2019-01-17 DIAGNOSIS — Z7982 Long term (current) use of aspirin: Secondary | ICD-10-CM

## 2019-01-17 DIAGNOSIS — I10 Essential (primary) hypertension: Secondary | ICD-10-CM | POA: Diagnosis present

## 2019-01-17 DIAGNOSIS — Z86010 Personal history of colonic polyps: Secondary | ICD-10-CM

## 2019-01-17 DIAGNOSIS — R1013 Epigastric pain: Secondary | ICD-10-CM | POA: Diagnosis not present

## 2019-01-17 DIAGNOSIS — Z0189 Encounter for other specified special examinations: Secondary | ICD-10-CM

## 2019-01-17 DIAGNOSIS — K56600 Partial intestinal obstruction, unspecified as to cause: Principal | ICD-10-CM | POA: Diagnosis present

## 2019-01-17 DIAGNOSIS — Z8249 Family history of ischemic heart disease and other diseases of the circulatory system: Secondary | ICD-10-CM

## 2019-01-17 DIAGNOSIS — Z6841 Body Mass Index (BMI) 40.0 and over, adult: Secondary | ICD-10-CM

## 2019-01-17 LAB — COMPREHENSIVE METABOLIC PANEL WITH GFR
ALT: 20 U/L (ref 0–44)
AST: 22 U/L (ref 15–41)
Albumin: 4.4 g/dL (ref 3.5–5.0)
Alkaline Phosphatase: 107 U/L (ref 38–126)
Anion gap: 11 (ref 5–15)
BUN: 16 mg/dL (ref 8–23)
CO2: 23 mmol/L (ref 22–32)
Calcium: 9 mg/dL (ref 8.9–10.3)
Chloride: 102 mmol/L (ref 98–111)
Creatinine, Ser: 0.93 mg/dL (ref 0.61–1.24)
GFR calc Af Amer: >60 mL/min
GFR calc non Af Amer: >60 mL/min
Glucose, Bld: 111 mg/dL — ABNORMAL HIGH (ref 70–99)
Potassium: 3.5 mmol/L (ref 3.5–5.1)
Sodium: 136 mmol/L (ref 135–145)
Total Bilirubin: 1.1 mg/dL (ref 0.3–1.2)
Total Protein: 8.5 g/dL — ABNORMAL HIGH (ref 6.5–8.1)

## 2019-01-17 LAB — CBC
HCT: 44.9 % (ref 39.0–52.0)
Hemoglobin: 14.7 g/dL (ref 13.0–17.0)
MCH: 29.3 pg (ref 26.0–34.0)
MCHC: 32.7 g/dL (ref 30.0–36.0)
MCV: 89.4 fL (ref 80.0–100.0)
Platelets: 378 10*3/uL (ref 150–400)
RBC: 5.02 MIL/uL (ref 4.22–5.81)
RDW: 13.7 % (ref 11.5–15.5)
WBC: 8.7 10*3/uL (ref 4.0–10.5)
nRBC: 0 % (ref 0.0–0.2)

## 2019-01-17 LAB — URINALYSIS, ROUTINE W REFLEX MICROSCOPIC
Bilirubin Urine: NEGATIVE
Glucose, UA: NEGATIVE mg/dL
Hgb urine dipstick: NEGATIVE
Ketones, ur: 5 mg/dL — AB
Leukocytes,Ua: NEGATIVE
Nitrite: NEGATIVE
Protein, ur: NEGATIVE mg/dL
Specific Gravity, Urine: 1.027 (ref 1.005–1.030)
pH: 5 (ref 5.0–8.0)

## 2019-01-17 LAB — LIPASE, BLOOD: Lipase: 19 U/L (ref 11–51)

## 2019-01-17 MED ORDER — MORPHINE SULFATE (PF) 4 MG/ML IV SOLN
4.0000 mg | Freq: Once | INTRAVENOUS | Status: AC
  Administered 2019-01-17: 4 mg via INTRAVENOUS
  Filled 2019-01-17: qty 1

## 2019-01-17 MED ORDER — SODIUM CHLORIDE 0.9% FLUSH
3.0000 mL | Freq: Once | INTRAVENOUS | Status: AC
  Administered 2019-01-17 (×2): 3 mL via INTRAVENOUS

## 2019-01-17 MED ORDER — SODIUM CHLORIDE (PF) 0.9 % IJ SOLN
INTRAMUSCULAR | Status: AC
  Administered 2019-01-17: 3 mL via INTRAVENOUS
  Filled 2019-01-17: qty 50

## 2019-01-17 MED ORDER — ONDANSETRON HCL 4 MG/2ML IJ SOLN
4.0000 mg | Freq: Once | INTRAMUSCULAR | Status: AC
  Administered 2019-01-17: 4 mg via INTRAVENOUS
  Filled 2019-01-17: qty 2

## 2019-01-17 MED ORDER — IOHEXOL 300 MG/ML  SOLN
100.0000 mL | Freq: Once | INTRAMUSCULAR | Status: AC | PRN
  Administered 2019-01-17: 100 mL via INTRAVENOUS

## 2019-01-17 MED ORDER — SODIUM CHLORIDE 0.9 % IV BOLUS
1000.0000 mL | Freq: Once | INTRAVENOUS | Status: AC
  Administered 2019-01-17: 1000 mL via INTRAVENOUS

## 2019-01-17 NOTE — ED Triage Notes (Signed)
Patient c/o left mid abdominal pain that started yesterday. Patient states one emesis today. Patient states his abdomen was "really hard" yesterday, but much better today.  "My children and wife made me come in today. I feel much better."

## 2019-01-17 NOTE — ED Provider Notes (Signed)
[facility]-EMERGENCY DEPT Provider Note   CSN: [id number] Arrival date & time: 01/17/19  1740    History   Chief Complaint Chief Complaint  Patient presents with  . Abdominal Pain  . Emesis    HPI Ronald Harding is a 63 y.o. male with history of ascending thoracic aneurysm, hypertension, colon polyps presents for evaluation of acute onset, progressively worsening abdominal pain for 3 days.  He reports pain is pressure-like, primarily in the epigastric region but radiating to the left side of the abdomen.  He has had multiple episodes of nonbloody but bilious emesis between yesterday and today.  Last bowel movement was earlier today and was normal for him.  Reports feeling bloated, with belching and hiccups.  Reports he is passing gas without difficulty.  Has tried ginger ale, soup, Gatorade without relief of symptoms.  Denies fever, chest pain, shortness of breath, urinary symptoms, melena, medicated, diarrhea, or constipation.  That has had similar symptoms in the past.  Chart review shows that patient's wife called PCP stating that his symptoms were similar to when he has experienced a bowel obstruction in the past.  They recommended presentation to the ED for further evaluation.  He denies any prior abdominal surgeries.  Thinks his symptoms began after eating 80-day-old cabbage.  No known sick contacts at home, no recent travel, no recent treatment antibiotics.     The history is provided by the patient and medical records.    Past Medical History:  Diagnosis Date  . Aortic aneurysm (HCC)    thoracic ascending  . Asthma    hx of-as a child  . History of colon polyps    benign  . Hypertension    takes Hyzaar daily  . Joint pain   . Pneumonia    hx of-in the 70's    Patient Active Problem List   Diagnosis Date Noted  . Thoracic aortic aneurysm without rupture (HCC) 06/06/2018  . Greater trochanteric bursitis of left hip 08/10/2017  . Routine general  medical examination at a health care facility 06/30/2016  . Thoracic ascending aortic aneurysm (HCC) 08/20/2015  . Essential hypertension 06/30/2015  . Morbid obesity (HCC) 06/30/2015  . Cough 06/30/2015    Past Surgical History:  Procedure Laterality Date  . ANKLE SURGERY Right 2012  . COLONOSCOPY    . MULTIPLE EXTRACTIONS WITH ALVEOLOPLASTY N/A 09/23/2015   Procedure: Extraction of tooth #'s 1-12,14- 28, 30-32 with alveoloplasty, bilateral mandibular tori reductions and reduction of lateral exostoses.;  Surgeon: Charlynne Pander, DDS;  Location: [facility] OR;  Service: Oral Surgery;  Laterality: N/A;  . NOSE SURGERY  2013        Home Medications    Prior to Admission medications   Medication Sig Start Date End Date Taking? Authorizing Provider  albuterol (PROVENTIL HFA;VENTOLIN HFA) 108 (90 Base) MCG/ACT inhaler Inhale 2 puffs into the lungs every 6 (six) hours as needed for wheezing or shortness of breath. 10/18/18   Myrlene Broker, MD  amoxicillin-clavulanate (AUGMENTIN) 875-125 MG tablet Take 1 tablet by mouth 2 (two) times daily. 10/25/18   Pincus Sanes, MD  aspirin 81 MG tablet Take 81 mg by mouth daily.    [provider]  losartan-hydrochlorothiazide (HYZAAR) 50-12.5 MG tablet Take 1 tablet by mouth daily. 07/26/18   Myrlene Broker, MD    Family History Family History  Problem Relation Age of Onset  . Hypertension Mother   . Arthritis Mother   . Hypertension Father   .  Heart disease Father   . Arthritis Father     Social History Social History   Tobacco Use  . Smoking status: Former Games developermoker  . Smokeless tobacco: Current User    Types: Chew  . Tobacco comment: one pack of chew per week  Substance Use Topics  . Alcohol use: No    Alcohol/week: 0.0 standard drinks  . Drug use: No     Allergies   Betadine [povidone iodine]   Review of Systems Review of Systems  Constitutional: Negative for chills and fever.  Respiratory: Negative for  shortness of breath.   Cardiovascular: Negative for chest pain.  Gastrointestinal: Positive for abdominal pain, nausea and vomiting. Negative for constipation and diarrhea.  All other systems reviewed and are negative.    Physical Exam Updated Vital Signs BP 118/77   Pulse 72   Temp 98.4 F (36.9 C)   Resp 16   Ht 6' (1.829 m)   Wt (!) 147.9 kg   SpO2 99%   BMI 44.21 kg/m   Physical Exam Vitals signs and nursing note reviewed.  Constitutional:      General: He is not in acute distress.    Appearance: He is well-developed.  HENT:     Head: Normocephalic and atraumatic.  Eyes:     General:        Right eye: No discharge.        Left eye: No discharge.     Conjunctiva/sclera: Conjunctivae normal.  Neck:     Vascular: No JVD.     Trachea: No tracheal deviation.  Cardiovascular:     Rate and Rhythm: Normal rate and regular rhythm.     Heart sounds: Normal heart sounds.     Comments: 2+ radial and DP/PT pulses bilaterally, no lower extremity edema, no palpable cords, compartments are soft  Pulmonary:     Effort: Pulmonary effort is normal.     Breath sounds: Normal breath sounds.  Abdominal:     General: Abdomen is protuberant. Bowel sounds are increased. There is distension.     Tenderness: There is abdominal tenderness in the epigastric area, left upper quadrant and left lower quadrant. There is no right CVA tenderness or left CVA tenderness. Negative signs include Murphy's sign and Rovsing's sign.  Skin:    General: Skin is warm and dry.     Findings: No erythema.  Neurological:     Mental Status: He is alert.  Psychiatric:        Behavior: Behavior normal.      ED Treatments / Results  Labs (all labs ordered are listed, but only abnormal results are displayed) Labs Reviewed  COMPREHENSIVE METABOLIC PANEL - Abnormal; Notable for the following components:      Result Value   Glucose, Bld 111 (*)    Total Protein 8.5 (*)    All other components within  normal limits  URINALYSIS, ROUTINE W REFLEX MICROSCOPIC - Abnormal; Notable for the following components:   APPearance HAZY (*)    Ketones, ur 5 (*)    All other components within normal limits  LIPASE, BLOOD  CBC    EKG None  Radiology Ct Abdomen Pelvis W Contrast  Result Date: 01/17/2019 CLINICAL DATA:  Left mid abdominal pain, emesis EXAM: CT ABDOMEN AND PELVIS WITH CONTRAST TECHNIQUE: Multidetector CT imaging of the abdomen and pelvis was performed using the standard protocol following bolus administration of intravenous contrast. CONTRAST:  100mL OMNIPAQUE IOHEXOL 300 MG/ML  SOLN COMPARISON:  None. FINDINGS: Lower  chest: No acute abnormality. Hepatobiliary: No focal liver abnormality is seen. No gallstones, gallbladder wall thickening, or biliary dilatation. Pancreas: Unremarkable. No pancreatic ductal dilatation or surrounding inflammatory changes. Spleen: Normal in size without focal abnormality. Adrenals/Urinary Tract: Adrenal glands are unremarkable. Kidneys are normal, without renal calculi, focal lesion, or hydronephrosis. Bladder is unremarkable. Stomach/Bowel: Stomach is within normal limits. Appendix appears normal. There are multiple loops of distended and fecalized proximal to mid small bowel, largest loops measuring approximately 5.7 cm. The distal small bowel is profoundly decompressed and there is no definite transition point although there is an abrupt change of bowel caliber in the central abdomen (series 2, image 70). There is scattered gas and stool present in the colon to the rectum. Occasional descending and sigmoid diverticula. Vascular/Lymphatic: No significant vascular findings are present. No enlarged abdominal or pelvic lymph nodes. Reproductive: No mass or other abnormality. Other: No abdominal wall hernia or abnormality. No abdominopelvic ascites. Musculoskeletal: No acute or significant osseous findings. IMPRESSION: There are multiple loops of distended and fecalized  proximal to mid small bowel, largest loops measuring approximately 5.7 cm. The distal small bowel is profoundly decompressed and there is no definite transition point although there is an abrupt change of bowel caliber in the central abdomen (series 2, image 70). There is scattered gas and stool present in the colon to the rectum. Findings generally suggest a partial or developing bowel obstruction, possibly due to stricture in the mid small bowel. Electronically Signed   By: Lauralyn Primes M.D.   On: 01/17/2019 21:48    Procedures Procedures (including critical care time)  Medications Ordered in ED Medications  sodium chloride flush (NS) 0.9 % injection 3 mL (3 mLs Intravenous Given by Other 01/17/19 2111)  morphine 4 MG/ML injection 4 mg (4 mg Intravenous Given 01/17/19 2054)  ondansetron (ZOFRAN) injection 4 mg (4 mg Intravenous Given 01/17/19 2054)  sodium chloride 0.9 % bolus 1,000 mL (0 mLs Intravenous Stopped 01/17/19 2237)  iohexol (OMNIPAQUE) 300 MG/ML solution 100 mL (100 mLs Intravenous Contrast Given 01/17/19 2128)     Initial Impression / Assessment and Plan / ED Course  I have reviewed the triage vital signs and the nursing notes.  Pertinent labs & imaging results that were available during my care of the patient were reviewed by me and considered in my medical decision making (see chart for details).        Patient presenting for evaluation of abdominal distention, nausea vomiting.  He is afebrile, vital signs are stable.  He is nontoxic in appearance.  Lab work significant for mild hyperglycemia.  UA does not suggest UTI or nephrolithiasis.  No leukocytosis, no anemia, no metabolic derangements.  CT scan concerning for early or developing small bowel obstruction likely due to stricture in the mid small bowel.  Pain managed in the ED and on reevaluation patient resting comfortably in no apparent distress.  Spoke with Dr. Carolynne Edouard with general surgery who agrees to assess the patient.   Plan for likely admission.  Final Clinical Impressions(s) / ED Diagnoses   Final diagnoses:  Small bowel obstruction [facility])    ED Discharge Orders    None       Bennye Alm 01/17/19 2324    Lorre Nick, MD 01/18/19 [phone]

## 2019-01-17 NOTE — Telephone Encounter (Signed)
Wife calling about her husband  With c/o severe vomiting, abdominal pain and distension and mild diarrhea. Pt's wife stated that the pt is having the same symptoms as before when he had a bowel obstruction. Pt is unable to keep and food or liquids down. Pt's wife stated that the pt is rubbing his stomach due to the pain. Advised wife to tell pt not to eat or drink anything further and to go to the ED. Wife plans to take him. Informed wife that the ED's are enforcing no visitors at this time. Wife verbalized understanding. Pt is going to [facility] ED. Reason for Disposition . [1] SEVERE vomiting (e.g., 6 or more times/day) AND [2] present > 8 hours  Answer Assessment - Initial Assessment Questions 1. VOMITING SEVERITY: "How many times have you vomited in the past 24 hours?"     - MILD:  1 - 2 times/day    - MODERATE: 3 - 5 times/day, decreased oral intake without significant weight loss or symptoms of dehydration    - SEVERE: 6 or more times/day, vomits everything or nearly everything, with significant weight loss, symptoms of dehydration      severe 2. ONSET: "When did the vomiting begin?"      yesterday 3. FLUIDS: "What fluids or food have you vomited up today?" "Have you been able to keep any fluids down?"     Nothing- unable to keep food or fluids dowm 4. ABDOMINAL PAIN: "Are your having any abdominal pain?" If yes : "How bad is it and what does it feel like?" (e.g., crampy, dull, intermittent, constant)      Yes-rubbing  His stomach, abdomen distended 5. DIARRHEA: "Is there any diarrhea?" If so, ask: "How many times today?"      Yes-none but had diarrhea yesterday 6. CONTACTS: "Is there anyone else in the family with the same symptoms?"      no 7. CAUSE: "What do you think is causing your vomiting?"     Bowel obstruction 8. HYDRATION STATUS: "Any signs of dehydration?" (e.g., dry mouth [not only dry lips], too weak to stand) "When did you last urinate?"     No-10 minutes ago 9. OTHER  SYMPTOMS: "Do you have any other symptoms?" (e.g., fever, headache, vertigo, vomiting blood or coffee grounds, recent head injury)     *No Answer* 10. PREGNANCY: "Is there any chance you are pregnant?" "When was your last menstrual period?"       *No Answer*  Protocols used: [facility]

## 2019-01-17 NOTE — H&P (Signed)
Ronald Harding is an 63 y.o. male.   Chief Complaint: abd pain HPI: The patient is a 63 year old white male who presents with some abdominal pain that started Wednesday.  He has had one episode of nausea and vomiting.  He had a similar episode to this about 6 years ago that resolved on its own.  He came to the emergency department where a CT scan showed what looks like a possible partial small bowel obstruction.  He has no history of previous abdominal surgery.  He does have a history of falling 20 feet in [place] about 8 years ago at which time he developed an ileus while in the hospital.  He denies any fevers or chills.  He has been passing some flatus and had a normal bowel movement earlier today  Past Medical History:  Diagnosis Date  . Aortic aneurysm (HCC)    thoracic ascending  . Asthma    hx of-as a child  . History of colon polyps    benign  . Hypertension    takes Hyzaar daily  . Joint pain   . Pneumonia    hx of-in the 70's    Past Surgical History:  Procedure Laterality Date  . ANKLE SURGERY Right 2012  . COLONOSCOPY    . MULTIPLE EXTRACTIONS WITH ALVEOLOPLASTY N/A 09/23/2015   Procedure: Extraction of tooth #'s 1-12,14- 28, 30-32 with alveoloplasty, bilateral mandibular tori reductions and reduction of lateral exostoses.;  Surgeon: Charlynne Pander, DDS;  Location: [facility] OR;  Service: Oral Surgery;  Laterality: N/A;  . NOSE SURGERY  2013    Family History  Problem Relation Age of Onset  . Hypertension Mother   . Arthritis Mother   . Hypertension Father   . Heart disease Father   . Arthritis Father    Social History:  reports that he has quit smoking. His smokeless tobacco use includes chew. He reports that he does not drink alcohol or use drugs.  Allergies:  Allergies  Allergen Reactions  . Betadine [Povidone Iodine] Hives    Pt states in 2012 in [place] he fell and broke several bones. Betadine broke him out.     (Not in a hospital  admission)   Results for orders placed or performed during the hospital encounter of 01/17/19 (from the past 48 hour(s))  Lipase, blood     Status: None   Collection Time: 01/17/19  7:39 PM  Result Value Ref Range   Lipase 19 11 - 51 U/L    Comment: Performed at [facility], [address], [place], [place] [address]  Comprehensive metabolic panel     Status: Abnormal   Collection Time: 01/17/19  7:39 PM  Result Value Ref Range   Sodium 136 135 - 145 mmol/L   Potassium 3.5 3.5 - 5.1 mmol/L   Chloride 102 98 - 111 mmol/L   CO2 23 22 - 32 mmol/L   Glucose, Bld 111 (H) 70 - 99 mg/dL   BUN 16 8 - 23 mg/dL   Creatinine, Ser 4.92 0.61 - 1.24 mg/dL   Calcium 9.0 8.9 - 01.0 mg/dL   Total Protein 8.5 (H) 6.5 - 8.1 g/dL   Albumin 4.4 3.5 - 5.0 g/dL   AST 22 15 - 41 U/L   ALT 20 0 - 44 U/L   Alkaline Phosphatase 107 38 - 126 U/L   Total Bilirubin 1.1 0.3 - 1.2 mg/dL   GFR calc non Af Amer >60 >60 mL/min   GFR calc  Af Amer >60 >60 mL/min   Anion gap 11 5 - 15    Comment: Performed at [facility], [address] [place], [place] [address]  CBC     Status: None   Collection Time: 01/17/19  7:39 PM  Result Value Ref Range   WBC 8.7 4.0 - 10.5 K/uL   RBC 5.02 4.22 - 5.81 MIL/uL   Hemoglobin 14.7 13.0 - 17.0 g/dL   HCT 02.3 34.3 - 56.8 %   MCV 89.4 80.0 - 100.0 fL   MCH 29.3 26.0 - 34.0 pg   MCHC 32.7 30.0 - 36.0 g/dL   RDW 61.6 83.7 - 29.0 %   Platelets 378 150 - 400 K/uL   nRBC 0.0 0.0 - 0.2 %    Comment: Performed at [facility], [address] [place], [place] [address]  Urinalysis, Routine w reflex microscopic     Status: Abnormal   Collection Time: 01/17/19  7:43 PM  Result Value Ref Range   Color, Urine YELLOW YELLOW   APPearance HAZY (A) CLEAR   Specific Gravity, Urine 1.027 1.005 - 1.030   pH 5.0 5.0 - 8.0   Glucose, UA NEGATIVE NEGATIVE mg/dL   Hgb urine dipstick NEGATIVE NEGATIVE   Bilirubin Urine NEGATIVE  NEGATIVE   Ketones, ur 5 (A) NEGATIVE mg/dL   Protein, ur NEGATIVE NEGATIVE mg/dL   Nitrite NEGATIVE NEGATIVE   Leukocytes,Ua NEGATIVE NEGATIVE    Comment: Performed at [facility], [address], [place], [place] [address]   Ct Abdomen Pelvis W Contrast  Result Date: 01/17/2019 CLINICAL DATA:  Left mid abdominal pain, emesis EXAM: CT ABDOMEN AND PELVIS WITH CONTRAST TECHNIQUE: Multidetector CT imaging of the abdomen and pelvis was performed using the standard protocol following bolus administration of intravenous contrast. CONTRAST:  OMNIPAQUE IOHEXOL 300 MG/ML  SOLN COMPARISON:  None. FINDINGS: Lower chest: No acute abnormality. Hepatobiliary: No focal liver abnormality is seen. No gallstones, gallbladder wall thickening, or biliary dilatation. Pancreas: Unremarkable. No pancreatic ductal dilatation or surrounding inflammatory changes. Spleen: Normal in size without focal abnormality. Adrenals/Urinary Tract: Adrenal glands are unremarkable. Kidneys are normal, without renal calculi, focal lesion, or hydronephrosis. Bladder is unremarkable. Stomach/Bowel: Stomach is within normal limits. Appendix appears normal. There are multiple loops of distended and fecalized proximal to mid small bowel, largest loops measuring approximately 5.7 cm. The distal small bowel is profoundly decompressed and there is no definite transition point although there is an abrupt change of bowel caliber in the central abdomen (series 2, image 70). There is scattered gas and stool present in the colon to the rectum. Occasional descending and sigmoid diverticula. Vascular/Lymphatic: No significant vascular findings are present. No enlarged abdominal or pelvic lymph nodes. Reproductive: No mass or other abnormality. Other: No abdominal wall hernia or abnormality. No abdominopelvic ascites. Musculoskeletal: No acute or significant osseous findings. IMPRESSION: There are multiple loops of distended and  fecalized proximal to mid small bowel, largest loops measuring approximately 5.7 cm. The distal small bowel is profoundly decompressed and there is no definite transition point although there is an abrupt change of bowel caliber in the central abdomen (series 2, image 70). There is scattered gas and stool present in the colon to the rectum. Findings generally suggest a partial or developing bowel obstruction, possibly due to stricture in the mid small bowel. Electronically Signed   By: Lauralyn Primes M.D.   On: 01/17/2019 21:48    Review of Systems  Constitutional: Negative.  HENT: Negative.   Eyes: Negative.   Respiratory: Negative.   Cardiovascular: Negative.   Gastrointestinal: Positive for abdominal pain, nausea and vomiting.  Genitourinary: Negative.   Musculoskeletal: Negative.   Skin: Negative.   Neurological: Negative.   Endo/Heme/Allergies: Negative.   Psychiatric/Behavioral: Negative.     Blood pressure 124/69, pulse 77, temperature 98.2 F (36.8 C), resp. rate 15, height 6' (1.829 m), weight (!) 147.9 kg, SpO2 98 %. Physical Exam  Constitutional: He is oriented to person, place, and time. He appears well-developed and well-nourished. No distress.  HENT:  Head: Normocephalic and atraumatic.  Mouth/Throat: No oropharyngeal exudate.  Eyes: Pupils are equal, round, and reactive to light. Conjunctivae and EOM are normal.  Neck: Normal range of motion. Neck supple.  Cardiovascular: Normal rate, regular rhythm and normal heart sounds.  Respiratory: Effort normal and breath sounds normal. No stridor. No respiratory distress.  GI: Soft.  There is very mild distension and tenderness on right abdomen. Good bs  Musculoskeletal: Normal range of motion.        General: No tenderness or edema.  Neurological: He is alert and oriented to person, place, and time. Coordination normal.  Skin: Skin is warm and dry. No rash noted.  Psychiatric: He has a normal mood and affect. His behavior is  normal. Thought content normal.     Assessment/Plan The patient appears to have a possible partial small bowel obstruction.  He is already starting to feel better.  We will plan to admit him for IV hydration and bowel rest and repeat his abdominal x-rays in the morning.  If he does not resolve then he may require exploration.  Chevis Pretty III, MD 01/17/2019, 11:49 PM

## 2019-01-18 ENCOUNTER — Observation Stay (HOSPITAL_COMMUNITY): Payer: Medicare HMO

## 2019-01-18 DIAGNOSIS — K56609 Unspecified intestinal obstruction, unspecified as to partial versus complete obstruction: Secondary | ICD-10-CM | POA: Diagnosis not present

## 2019-01-18 DIAGNOSIS — K56600 Partial intestinal obstruction, unspecified as to cause: Secondary | ICD-10-CM | POA: Diagnosis not present

## 2019-01-18 LAB — CBC
HCT: 44.7 % (ref 39.0–52.0)
Hemoglobin: 14.3 g/dL (ref 13.0–17.0)
MCH: 29 pg (ref 26.0–34.0)
MCHC: 32 g/dL (ref 30.0–36.0)
MCV: 90.7 fL (ref 80.0–100.0)
Platelets: 300 10*3/uL (ref 150–400)
RBC: 4.93 MIL/uL (ref 4.22–5.81)
RDW: 13.6 % (ref 11.5–15.5)
WBC: 7.2 10*3/uL (ref 4.0–10.5)
nRBC: 0 % (ref 0.0–0.2)

## 2019-01-18 LAB — BASIC METABOLIC PANEL WITH GFR
Anion gap: 11 (ref 5–15)
BUN: 17 mg/dL (ref 8–23)
CO2: 22 mmol/L (ref 22–32)
Calcium: 8.5 mg/dL — ABNORMAL LOW (ref 8.9–10.3)
Chloride: 101 mmol/L (ref 98–111)
Creatinine, Ser: 0.92 mg/dL (ref 0.61–1.24)
GFR calc Af Amer: >60 mL/min
GFR calc non Af Amer: >60 mL/min
Glucose, Bld: 110 mg/dL — ABNORMAL HIGH (ref 70–99)
Potassium: 3.5 mmol/L (ref 3.5–5.1)
Sodium: 134 mmol/L — ABNORMAL LOW (ref 135–145)

## 2019-01-18 MED ORDER — ONDANSETRON HCL 4 MG/2ML IJ SOLN
4.0000 mg | Freq: Four times a day (QID) | INTRAMUSCULAR | Status: DC | PRN
  Administered 2019-01-18 (×2): 4 mg via INTRAVENOUS
  Filled 2019-01-18 (×2): qty 2

## 2019-01-18 MED ORDER — KCL IN DEXTROSE-NACL 20-5-0.9 MEQ/L-%-% IV SOLN
INTRAVENOUS | Status: DC
  Administered 2019-01-18 – 2019-01-20 (×7): via INTRAVENOUS
  Filled 2019-01-18 (×8): qty 1000

## 2019-01-18 MED ORDER — HYDROCHLOROTHIAZIDE 12.5 MG PO CAPS
12.5000 mg | ORAL_CAPSULE | Freq: Every day | ORAL | Status: DC
  Administered 2019-01-18 – 2019-01-21 (×4): 12.5 mg via ORAL
  Filled 2019-01-18 (×4): qty 1

## 2019-01-18 MED ORDER — LOSARTAN POTASSIUM 50 MG PO TABS
50.0000 mg | ORAL_TABLET | Freq: Every day | ORAL | Status: DC
  Administered 2019-01-18 – 2019-01-21 (×4): 50 mg via ORAL
  Filled 2019-01-18 (×4): qty 1

## 2019-01-18 MED ORDER — LOSARTAN POTASSIUM-HCTZ 50-12.5 MG PO TABS: 1.0000 | ORAL_TABLET | Freq: Every day | ORAL | Status: DC

## 2019-01-18 MED ORDER — ALBUTEROL SULFATE HFA 108 (90 BASE) MCG/ACT IN AERS: 2.0000 | INHALATION_SPRAY | Freq: Four times a day (QID) | RESPIRATORY_TRACT | Status: DC | PRN

## 2019-01-18 MED ORDER — MORPHINE SULFATE (PF) 2 MG/ML IV SOLN
1.0000 mg | INTRAVENOUS | Status: DC | PRN
  Filled 2019-01-18: qty 1

## 2019-01-18 MED ORDER — PANTOPRAZOLE SODIUM 40 MG IV SOLR
40.0000 mg | Freq: Every day | INTRAVENOUS | Status: DC
  Administered 2019-01-18 – 2019-01-20 (×4): 40 mg via INTRAVENOUS
  Filled 2019-01-18 (×4): qty 40

## 2019-01-18 MED ORDER — ONDANSETRON 4 MG PO TBDP: 4.0000 mg | ORAL_TABLET | Freq: Four times a day (QID) | ORAL | Status: DC | PRN

## 2019-01-18 MED ORDER — DIATRIZOATE MEGLUMINE & SODIUM 66-10 % PO SOLN
90.0000 mL | Freq: Once | ORAL | Status: AC
  Administered 2019-01-18: 90 mL via ORAL
  Filled 2019-01-18: qty 90

## 2019-01-18 MED ORDER — ALBUTEROL SULFATE (2.5 MG/3ML) 0.083% IN NEBU: 2.5000 mg | INHALATION_SOLUTION | Freq: Four times a day (QID) | RESPIRATORY_TRACT | Status: DC | PRN

## 2019-01-18 NOTE — Progress Notes (Signed)
Pt with hypoactive bowel sounds times four. No needs at this time.

## 2019-01-18 NOTE — Plan of Care (Signed)
  Problem: Education: Goal: Knowledge of General Education information will improve Description: Including pain rating scale, medication(s)/side effects and non-pharmacologic comfort measures Outcome: Progressing   Problem: Elimination: Goal: Will not experience complications related to urinary retention Outcome: Progressing   Problem: Pain Managment: Goal: General experience of comfort will improve Outcome: Progressing   

## 2019-01-18 NOTE — Progress Notes (Signed)
Subjective/Chief Complaint: Complains of some mild nausea. No vomiting. Passing flatus and having small bm's   Objective: Vital signs in last 24 hours: Temp:  [98 F (36.7 C)-99.5 F (37.5 C)] 99.3 F (37.4 C) (03/28 0447) Pulse Rate:  [72-85] 72 (03/28 0447) Resp:  [14-18] 16 (03/28 0447) BP: (115-140)/(69-96) 140/87 (03/28 0447) SpO2:  [96 %-100 %] 97 % (03/28 0447) Weight:  [147.9 kg] 147.9 kg (03/27 1752) Last BM Date: 01/17/19  Intake/Output from previous day: 03/27 0701 - 03/28 0700 In: 1000 [IV Piggyback:1000] Out: -  Intake/Output this shift: Total I/O In: 523.6 [I.V.:523.6] Out: -   General appearance: alert and cooperative Resp: clear to auscultation bilaterally Cardio: regular rate and rhythm GI: soft, minimal tenderness. good bs  Lab Results:  Recent Labs    01/17/19 1939 01/18/19 0251  WBC 8.7 7.2  HGB 14.7 14.3  HCT 44.9 44.7  PLT 378 300   BMET Recent Labs    01/17/19 1939 01/18/19 0251  NA 136 134*  K 3.5 3.5  CL 102 101  CO2 23 22  GLUCOSE 111* 110*  BUN 16 17  CREATININE 0.93 0.92  CALCIUM 9.0 8.5*   PT/INR No results for input(s): LABPROT, INR in the last 72 hours. ABG No results for input(s): PHART, HCO3 in the last 72 hours.  Invalid input(s): PCO2, PO2  Studies/Results: Ct Abdomen Pelvis W Contrast  Result Date: 01/17/2019 CLINICAL DATA:  Left mid abdominal pain, emesis EXAM: CT ABDOMEN AND PELVIS WITH CONTRAST TECHNIQUE: Multidetector CT imaging of the abdomen and pelvis was performed using the standard protocol following bolus administration of intravenous contrast. CONTRAST:  OMNIPAQUE IOHEXOL 300 MG/ML  SOLN COMPARISON:  None. FINDINGS: Lower chest: No acute abnormality. Hepatobiliary: No focal liver abnormality is seen. No gallstones, gallbladder wall thickening, or biliary dilatation. Pancreas: Unremarkable. No pancreatic ductal dilatation or surrounding inflammatory changes. Spleen: Normal in size without focal  abnormality. Adrenals/Urinary Tract: Adrenal glands are unremarkable. Kidneys are normal, without renal calculi, focal lesion, or hydronephrosis. Bladder is unremarkable. Stomach/Bowel: Stomach is within normal limits. Appendix appears normal. There are multiple loops of distended and fecalized proximal to mid small bowel, largest loops measuring approximately 5.7 cm. The distal small bowel is profoundly decompressed and there is no definite transition point although there is an abrupt change of bowel caliber in the central abdomen (series 2, image 70). There is scattered gas and stool present in the colon to the rectum. Occasional descending and sigmoid diverticula. Vascular/Lymphatic: No significant vascular findings are present. No enlarged abdominal or pelvic lymph nodes. Reproductive: No mass or other abnormality. Other: No abdominal wall hernia or abnormality. No abdominopelvic ascites. Musculoskeletal: No acute or significant osseous findings. IMPRESSION: There are multiple loops of distended and fecalized proximal to mid small bowel, largest loops measuring approximately 5.7 cm. The distal small bowel is profoundly decompressed and there is no definite transition point although there is an abrupt change of bowel caliber in the central abdomen (series 2, image 70). There is scattered gas and stool present in the colon to the rectum. Findings generally suggest a partial or developing bowel obstruction, possibly due to stricture in the mid small bowel. Electronically Signed   By: Lauralyn Primes M.D.   On: 01/17/2019 21:48   Dg Abd Portable 2v  Result Date: 01/18/2019 CLINICAL DATA:  Small bowel obstruction EXAM: PORTABLE ABDOMEN - 2 VIEW COMPARISON:  CT abdomen/pelvis dated 01/17/2019 FINDINGS: Multiple dilated loops of small bowel in the central abdomen, compatible  with at least partial small-bowel obstruction on CT. Transverse colon is not decompressed. Excretory contrast in the bladder. IMPRESSION:  Multiple dilated loops of small bowel in the central abdomen, compatible with small bowel obstruction. Electronically Signed   By: Charline Bills M.D.   On: 01/18/2019 06:07    Anti-infectives: Anti-infectives (From admission, onward)   None      Assessment/Plan: s/p * No surgery found * still has evidence of psbo on abd xray although belly is soft. Will start small bowel protocol today ambulate  LOS: 0 days    Ronald Harding 01/18/2019

## 2019-01-18 NOTE — ED Notes (Signed)
ED TO INPATIENT HANDOFF REPORT  ED Nurse Name and Phone #: connie RN   S Name/Age/Gender Ronald Harding 63 y.o. male Room/Bed: WOTF/NONE  Code Status   Code Status: Full Code  Home/SNF/Other Home Patient oriented to: self, place, time and situation Is this baseline? Yes   Triage Complete: Triage complete  Chief Complaint Abdominal pain  Triage Note Patient c/o left mid abdominal pain that started yesterday. Patient states one emesis today. Patient states his abdomen was "really hard" yesterday, but much better today.  "My children and wife made me come in today. I feel much better."   Allergies Allergies  Allergen Reactions  . Betadine [Povidone Iodine] Hives    Pt states in 2012 in [place] he fell and broke several bones. Betadine broke him out.     Level of Care/Admitting Diagnosis ED Disposition    ED Disposition Condition Comment   Admit  Hospital Area: [facility] [[id number]]  Level of Care: Med-Surg [16]  Diagnosis: SBO (small bowel obstruction) [facility]) [[id number]]  Admitting Physician: Jayme Cloud  Attending Physician: CCS, MD [[id number]  PT Class (Do Not Modify): Observation [104]  PT Acc Code (Do Not Modify): Observation [10022]       B Medical/Surgery History Past Medical History:  Diagnosis Date  . Aortic aneurysm (HCC)    thoracic ascending  . Asthma    hx of-as a child  . History of colon polyps    benign  . Hypertension    takes Hyzaar daily  . Joint pain   . Pneumonia    hx of-in the 70's   Past Surgical History:  Procedure Laterality Date  . ANKLE SURGERY Right 2012  . COLONOSCOPY    . MULTIPLE EXTRACTIONS WITH ALVEOLOPLASTY N/A 09/23/2015   Procedure: Extraction of tooth #'s 1-12,14- 28, 30-32 with alveoloplasty, bilateral mandibular tori reductions and reduction of lateral exostoses.;  Surgeon: Charlynne Pander, DDS;  Location: [facility] OR;  Service: Oral Surgery;  Laterality: N/A;  . NOSE SURGERY  2013      A IV Location/Drains/Wounds Patient Lines/Drains/Airways Status   Active Line/Drains/Airways    Name:   Placement date:   Placement time:   Site:   Days:   Peripheral IV 01/17/19 Left Hand   01/17/19    2051    Hand   1   Incision (Closed) 09/23/15 N/A   09/23/15    1039     1213          Intake/Output Last 24 hours  Intake/Output Summary (Last 24 hours) at 01/18/2019 0117 Last data filed at 01/17/2019 2237 Gross per 24 hour  Intake 1000 ml  Output -  Net 1000 ml    Labs/Imaging Results for orders placed or performed during the hospital encounter of 01/17/19 (from the past 48 hour(s))  Lipase, blood     Status: None   Collection Time: 01/17/19  7:39 PM  Result Value Ref Range   Lipase 19 11 - 51 U/L    Comment: Performed at [facility], [address], [place], [place] [address]  Comprehensive metabolic panel     Status: Abnormal   Collection Time: 01/17/19  7:39 PM  Result Value Ref Range   Sodium 136 135 - 145 mmol/L   Potassium 3.5 3.5 - 5.1 mmol/L   Chloride 102 98 - 111 mmol/L   CO2 23 22 - 32 mmol/L   Glucose, Bld 111 (H) 70 - 99 mg/dL   BUN  16 8 - 23 mg/dL   Creatinine, Ser 5.02 0.61 - 1.24 mg/dL   Calcium 9.0 8.9 - 77.4 mg/dL   Total Protein 8.5 (H) 6.5 - 8.1 g/dL   Albumin 4.4 3.5 - 5.0 g/dL   AST 22 15 - 41 U/L   ALT 20 0 - 44 U/L   Alkaline Phosphatase 107 38 - 126 U/L   Total Bilirubin 1.1 0.3 - 1.2 mg/dL   GFR calc non Af Amer >60 >60 mL/min   GFR calc Af Amer >60 >60 mL/min   Anion gap 11 5 - 15    Comment: Performed at [facility], [address] [place], [place] [address]  CBC     Status: None   Collection Time: 01/17/19  7:39 PM  Result Value Ref Range   WBC 8.7 4.0 - 10.5 K/uL   RBC 5.02 4.22 - 5.81 MIL/uL   Hemoglobin 14.7 13.0 - 17.0 g/dL   HCT 67.6 72.0 - 94.7 %   MCV 89.4 80.0 - 100.0 fL   MCH 29.3 26.0 - 34.0 pg   MCHC 32.7 30.0 - 36.0 g/dL   RDW 09.6 28.3 - 66.2 %   Platelets 378 150 - 400  K/uL   nRBC 0.0 0.0 - 0.2 %    Comment: Performed at [facility], [address] [place], [place] [address]  Urinalysis, Routine w reflex microscopic     Status: Abnormal   Collection Time: 01/17/19  7:43 PM  Result Value Ref Range   Color, Urine YELLOW YELLOW   APPearance HAZY (A) CLEAR   Specific Gravity, Urine 1.027 1.005 - 1.030   pH 5.0 5.0 - 8.0   Glucose, UA NEGATIVE NEGATIVE mg/dL   Hgb urine dipstick NEGATIVE NEGATIVE   Bilirubin Urine NEGATIVE NEGATIVE   Ketones, ur 5 (A) NEGATIVE mg/dL   Protein, ur NEGATIVE NEGATIVE mg/dL   Nitrite NEGATIVE NEGATIVE   Leukocytes,Ua NEGATIVE NEGATIVE    Comment: Performed at [facility], [address], [place], [place] [address]   Ct Abdomen Pelvis W Contrast  Result Date: 01/17/2019 CLINICAL DATA:  Left mid abdominal pain, emesis EXAM: CT ABDOMEN AND PELVIS WITH CONTRAST TECHNIQUE: Multidetector CT imaging of the abdomen and pelvis was performed using the standard protocol following bolus administration of intravenous contrast. CONTRAST:  OMNIPAQUE IOHEXOL 300 MG/ML  SOLN COMPARISON:  None. FINDINGS: Lower chest: No acute abnormality. Hepatobiliary: No focal liver abnormality is seen. No gallstones, gallbladder wall thickening, or biliary dilatation. Pancreas: Unremarkable. No pancreatic ductal dilatation or surrounding inflammatory changes. Spleen: Normal in size without focal abnormality. Adrenals/Urinary Tract: Adrenal glands are unremarkable. Kidneys are normal, without renal calculi, focal lesion, or hydronephrosis. Bladder is unremarkable. Stomach/Bowel: Stomach is within normal limits. Appendix appears normal. There are multiple loops of distended and fecalized proximal to mid small bowel, largest loops measuring approximately 5.7 cm. The distal small bowel is profoundly decompressed and there is no definite transition point although there is an abrupt change of bowel caliber in the central  abdomen (series 2, image 70). There is scattered gas and stool present in the colon to the rectum. Occasional descending and sigmoid diverticula. Vascular/Lymphatic: No significant vascular findings are present. No enlarged abdominal or pelvic lymph nodes. Reproductive: No mass or other abnormality. Other: No abdominal wall hernia or abnormality. No abdominopelvic ascites. Musculoskeletal: No acute or significant osseous findings. IMPRESSION: There are multiple loops of distended and fecalized proximal to mid small bowel, largest loops measuring  approximately 5.7 cm. The distal small bowel is profoundly decompressed and there is no definite transition point although there is an abrupt change of bowel caliber in the central abdomen (series 2, image 70). There is scattered gas and stool present in the colon to the rectum. Findings generally suggest a partial or developing bowel obstruction, possibly due to stricture in the mid small bowel. Electronically Signed   By: Lauralyn Primes M.D.   On: 01/17/2019 21:48    Pending Labs [employer] (From admission, onward)    Start     Ordered   Signed and Held  HIV antibody (Routine Testing)  Once,   R     Signed and Held   Signed and Armed forces training and education officer morning,   R     Signed and Held   Signed and Held  CBC  Tomorrow morning,   R     Signed and Held          Vitals/Pain Today's Vitals   01/17/19 2237 01/17/19 2237 01/17/19 2326 01/18/19 0055  BP: 118/77  124/69 118/74  Pulse: 72  77 74  Resp: 16  15 14   Temp: 98.4 F (36.9 C)  98.2 F (36.8 C) 98 F (36.7 C)  TempSrc:      SpO2: 99%  98% 99%  Weight:      Height:      PainSc:  0-No pain      Isolation Precautions No active isolations  Medications Medications  sodium chloride flush (NS) 0.9 % injection 3 mL (3 mLs Intravenous Given by Other 01/17/19 2111)  morphine 4 MG/ML injection 4 mg (4 mg Intravenous Given 01/17/19 2054)  ondansetron (ZOFRAN) injection 4 mg (4 mg  Intravenous Given 01/17/19 2054)  sodium chloride 0.9 % bolus 1,000 mL (0 mLs Intravenous Stopped 01/17/19 2237)  iohexol (OMNIPAQUE) 300 MG/ML solution 100 mL (100 mLs Intravenous Contrast Given 01/17/19 2128)    Mobility walks Low fall risk   Focused Assessments Neuro Assessment Handoff:  Swallow screen pass? Yes  Cardiac Rhythm: Normal sinus rhythm       Neuro Assessment:   Neuro Checks:      Last Documented NIHSS Modified Score:   Has TPA been given? No If patient is a Neuro Trauma and patient is going to OR before floor call report to 4N Charge nurse: [phone] or [phone]     R Recommendations: See Admitting Provider Note  Report given to:   Additional Notes: Abd pain

## 2019-01-18 NOTE — Plan of Care (Signed)
  Problem: Clinical Measurements: Goal: Will remain free from infection Outcome: Progressing   Problem: Clinical Measurements: Goal: Diagnostic test results will improve Outcome: Progressing   Problem: Clinical Measurements: Goal: Respiratory complications will improve Outcome: Progressing   Problem: Clinical Measurements: Goal: Cardiovascular complication will be avoided Outcome: Progressing   

## 2019-01-18 NOTE — Progress Notes (Signed)
Pt stable at time of bedside report with Bunnie Pion. No needs at time of report. No signs of distress. Pt denies pain.

## 2019-01-19 ENCOUNTER — Observation Stay (HOSPITAL_COMMUNITY): Payer: Medicare HMO

## 2019-01-19 DIAGNOSIS — K56609 Unspecified intestinal obstruction, unspecified as to partial versus complete obstruction: Secondary | ICD-10-CM | POA: Diagnosis not present

## 2019-01-19 DIAGNOSIS — Z931 Gastrostomy status: Secondary | ICD-10-CM | POA: Diagnosis not present

## 2019-01-19 DIAGNOSIS — J45909 Unspecified asthma, uncomplicated: Secondary | ICD-10-CM | POA: Diagnosis present

## 2019-01-19 DIAGNOSIS — Z79899 Other long term (current) drug therapy: Secondary | ICD-10-CM | POA: Diagnosis not present

## 2019-01-19 DIAGNOSIS — Z7982 Long term (current) use of aspirin: Secondary | ICD-10-CM | POA: Diagnosis not present

## 2019-01-19 DIAGNOSIS — K56600 Partial intestinal obstruction, unspecified as to cause: Secondary | ICD-10-CM | POA: Diagnosis present

## 2019-01-19 DIAGNOSIS — Z9181 History of falling: Secondary | ICD-10-CM | POA: Diagnosis not present

## 2019-01-19 DIAGNOSIS — Z8249 Family history of ischemic heart disease and other diseases of the circulatory system: Secondary | ICD-10-CM | POA: Diagnosis not present

## 2019-01-19 DIAGNOSIS — I10 Essential (primary) hypertension: Secondary | ICD-10-CM | POA: Diagnosis present

## 2019-01-19 DIAGNOSIS — I712 Thoracic aortic aneurysm, without rupture: Secondary | ICD-10-CM | POA: Diagnosis present

## 2019-01-19 DIAGNOSIS — R1013 Epigastric pain: Secondary | ICD-10-CM | POA: Diagnosis present

## 2019-01-19 DIAGNOSIS — F17220 Nicotine dependence, chewing tobacco, uncomplicated: Secondary | ICD-10-CM | POA: Diagnosis present

## 2019-01-19 DIAGNOSIS — R69 Illness, unspecified: Secondary | ICD-10-CM | POA: Diagnosis not present

## 2019-01-19 DIAGNOSIS — Z888 Allergy status to other drugs, medicaments and biological substances status: Secondary | ICD-10-CM | POA: Diagnosis not present

## 2019-01-19 DIAGNOSIS — Z86010 Personal history of colonic polyps: Secondary | ICD-10-CM | POA: Diagnosis not present

## 2019-01-19 DIAGNOSIS — Z6841 Body Mass Index (BMI) 40.0 and over, adult: Secondary | ICD-10-CM | POA: Diagnosis not present

## 2019-01-19 LAB — HIV ANTIBODY (ROUTINE TESTING W REFLEX): HIV Screen 4th Generation wRfx: NONREACTIVE

## 2019-01-19 MED ORDER — MENTHOL 3 MG MT LOZG
1.0000 | LOZENGE | OROMUCOSAL | Status: DC | PRN
  Filled 2019-01-19: qty 9

## 2019-01-19 MED ORDER — PHENOL 1.4 % MT LIQD
1.0000 | OROMUCOSAL | Status: DC | PRN
  Filled 2019-01-19: qty 177

## 2019-01-19 NOTE — Progress Notes (Signed)
Subjective/Chief Complaint: No complaints. Feels great. Vomited a lot last night. Got ng tube   Objective: Vital signs in last 24 hours: Temp:  [98.2 F (36.8 C)-99.2 F (37.3 C)] 98.3 F (36.8 C) (03/29 0652) Pulse Rate:  [67-77] 67 (03/29 0652) Resp:  [16] 16 (03/29 0652) BP: (123-134)/(64-81) 130/81 (03/29 0652) SpO2:  [96 %-98 %] 96 % (03/29 0652) Last BM Date: 01/17/19  Intake/Output from previous day: 03/28 0701 - 03/29 0700 In: 1489.4 [I.V.:1489.4] Out: 2120 [Urine:2020; Emesis/NG output:100] Intake/Output this shift: Total I/O In: 203.7 [P.O.:60; I.V.:143.7] Out: 450 [Urine:150; Emesis/NG output:300]  General appearance: alert and cooperative Resp: clear to auscultation bilaterally Cardio: regular rate and rhythm GI: soft, nontender. good bs  Lab Results:  Recent Labs    01/17/19 1939 01/18/19 0251  WBC 8.7 7.2  HGB 14.7 14.3  HCT 44.9 44.7  PLT 378 300   BMET Recent Labs    01/17/19 1939 01/18/19 0251  NA 136 134*  K 3.5 3.5  CL 102 101  CO2 23 22  GLUCOSE 111* 110*  BUN 16 17  CREATININE 0.93 0.92  CALCIUM 9.0 8.5*   PT/INR No results for input(s): LABPROT, INR in the last 72 hours. ABG No results for input(s): PHART, HCO3 in the last 72 hours.  Invalid input(s): PCO2, PO2  Studies/Results: Dg Abd 1 View  Result Date: 01/19/2019 CLINICAL DATA:  NG tube placement. EXAM: ABDOMEN - 1 VIEW COMPARISON:  Radiographs yesterday. FINDINGS: Tip and side port of the enteric tube below the diaphragm in the stomach. Enteric contrast in the colon. Dilated small bowel in the central abdomen partially included. IMPRESSION: Tip and side port of the enteric tube below the diaphragm in the stomach. Electronically Signed   By: Narda Rutherford M.D.   On: 01/19/2019 01:11   Ct Abdomen Pelvis W Contrast  Result Date: 01/17/2019 CLINICAL DATA:  Left mid abdominal pain, emesis EXAM: CT ABDOMEN AND PELVIS WITH CONTRAST TECHNIQUE: Multidetector CT imaging of  the abdomen and pelvis was performed using the standard protocol following bolus administration of intravenous contrast. CONTRAST:  OMNIPAQUE IOHEXOL 300 MG/ML  SOLN COMPARISON:  None. FINDINGS: Lower chest: No acute abnormality. Hepatobiliary: No focal liver abnormality is seen. No gallstones, gallbladder wall thickening, or biliary dilatation. Pancreas: Unremarkable. No pancreatic ductal dilatation or surrounding inflammatory changes. Spleen: Normal in size without focal abnormality. Adrenals/Urinary Tract: Adrenal glands are unremarkable. Kidneys are normal, without renal calculi, focal lesion, or hydronephrosis. Bladder is unremarkable. Stomach/Bowel: Stomach is within normal limits. Appendix appears normal. There are multiple loops of distended and fecalized proximal to mid small bowel, largest loops measuring approximately 5.7 cm. The distal small bowel is profoundly decompressed and there is no definite transition point although there is an abrupt change of bowel caliber in the central abdomen (series 2, image 70). There is scattered gas and stool present in the colon to the rectum. Occasional descending and sigmoid diverticula. Vascular/Lymphatic: No significant vascular findings are present. No enlarged abdominal or pelvic lymph nodes. Reproductive: No mass or other abnormality. Other: No abdominal wall hernia or abnormality. No abdominopelvic ascites. Musculoskeletal: No acute or significant osseous findings. IMPRESSION: There are multiple loops of distended and fecalized proximal to mid small bowel, largest loops measuring approximately 5.7 cm. The distal small bowel is profoundly decompressed and there is no definite transition point although there is an abrupt change of bowel caliber in the central abdomen (series 2, image 70). There is scattered gas and stool present in  the colon to the rectum. Findings generally suggest a partial or developing bowel obstruction, possibly due to stricture in the  mid small bowel. Electronically Signed   By: Lauralyn Primes M.D.   On: 01/17/2019 21:48   Dg Abd Portable 1v-small Bowel Obstruction Protocol-initial, 8 Hr Delay  Result Date: 01/18/2019 CLINICAL DATA:  Small bowel obstruction. EXAM: PORTABLE ABDOMEN - 1 VIEW COMPARISON:  01/18/2019 at 0521 hours FINDINGS: Oral contrast is now present in the stomach. There is persistent dilatation of multiple small bowel loops predominantly centrally in the abdomen. Colonic gas has decreased from the prior study. Minimal residual contrast is present in the bladder. IMPRESSION: Persistent small bowel dilatation consistent with obstruction. Oral contrast in the stomach. Electronically Signed   By: Sebastian Ache M.D.   On: 01/18/2019 19:13   Dg Abd Portable 2v  Result Date: 01/18/2019 CLINICAL DATA:  Small bowel obstruction EXAM: PORTABLE ABDOMEN - 2 VIEW COMPARISON:  CT abdomen/pelvis dated 01/17/2019 FINDINGS: Multiple dilated loops of small bowel in the central abdomen, compatible with at least partial small-bowel obstruction on CT. Transverse colon is not decompressed. Excretory contrast in the bladder. IMPRESSION: Multiple dilated loops of small bowel in the central abdomen, compatible with small bowel obstruction. Electronically Signed   By: Charline Bills M.D.   On: 01/18/2019 06:07    Anti-infectives: Anti-infectives (From admission, onward)   None      Assessment/Plan: s/p * No surgery found * Continue ng and bowel rest. oral contrast is in colon  Ambulate Clinically sbo improving. Will recheck abd xrays tomorrow  LOS: 0 days    Chevis Pretty III 01/19/2019

## 2019-01-19 NOTE — Progress Notes (Signed)
Found that patient was ordered a SBO protocol but it was not followed appropriately, Pt does not have NGT in and has had the gastrografin. Nurse notified on call MD about this situation and he ordered that the NGT be placed and Xray after placement of NGT.   Patient was informed of procedure for NGT  and he agreed to it as he's had two large emeses.

## 2019-01-20 ENCOUNTER — Inpatient Hospital Stay (HOSPITAL_COMMUNITY): Payer: Medicare HMO

## 2019-01-20 NOTE — Progress Notes (Addendum)
[facility] Progress Note     Subjective: CC: sbo Patient feeling better. Denies abdominal pain. Reports passing flatus and stool - loose BM. Hopeful to get home tomorrow.  Objective: Vital signs in last 24 hours: Temp:  [98.2 F (36.8 C)-98.3 F (36.8 C)] 98.2 F (36.8 C) (03/30 0615) Pulse Rate:  [61-66] 61 (03/30 0615) Resp:  [16-18] 18 (03/30 0615) BP: (126-138)/(73-84) 138/83 (03/30 0615) SpO2:  [96 %-98 %] 98 % (03/30 0615) Last BM Date: 01/17/19  Intake/Output from previous day: 03/29 0701 - 03/30 0700 In: 2375.1 [P.O.:240; I.V.:2135.1] Out: 2700 [Urine:1300; Emesis/NG output:1400] Intake/Output this shift: Total I/O In: 0  Out: 500 [Emesis/NG output:500]  PE: Gen:  Alert, NAD, pleasant Card:  Regular rate and rhythm Pulm:  Normal effort, clear to auscultation bilaterally Abd: Soft, non-tender, non-distended, +BS Skin: warm and dry, no rashes  Psych: A&Ox3   Lab Results:  Recent Labs    01/17/19 1939 01/18/19 0251  WBC 8.7 7.2  HGB 14.7 14.3  HCT 44.9 44.7  PLT 378 300   BMET Recent Labs    01/17/19 1939 01/18/19 0251  NA 136 134*  K 3.5 3.5  CL 102 101  CO2 23 22  GLUCOSE 111* 110*  BUN 16 17  CREATININE 0.93 0.92  CALCIUM 9.0 8.5*   PT/INR No results for input(s): LABPROT, INR in the last 72 hours. CMP     Component Value Date/Time   NA 134 (L) 01/18/2019 0251   NA 141 10/24/2016 1030   K 3.5 01/18/2019 0251   CL 101 01/18/2019 0251   CO2 22 01/18/2019 0251   GLUCOSE 110 (H) 01/18/2019 0251   BUN 17 01/18/2019 0251   BUN 20 10/24/2016 1030   CREATININE 0.92 01/18/2019 0251   CALCIUM 8.5 (L) 01/18/2019 0251   PROT 8.5 (H) 01/17/2019 1939   ALBUMIN 4.4 01/17/2019 1939   AST 22 01/17/2019 1939   ALT 20 01/17/2019 1939   ALKPHOS 107 01/17/2019 1939   BILITOT 1.1 01/17/2019 1939   GFRNONAA >60 01/18/2019 0251   GFRAA >60 01/18/2019 0251   Lipase     Component Value Date/Time   LIPASE 19 01/17/2019 1939        Studies/Results: Dg Abd 1 View  Result Date: 01/19/2019 CLINICAL DATA:  NG tube placement. EXAM: ABDOMEN - 1 VIEW COMPARISON:  Radiographs yesterday. FINDINGS: Tip and side port of the enteric tube below the diaphragm in the stomach. Enteric contrast in the colon. Dilated small bowel in the central abdomen partially included. IMPRESSION: Tip and side port of the enteric tube below the diaphragm in the stomach. Electronically Signed   By: Narda Rutherford M.D.   On: 01/19/2019 01:11   Dg Abd 2 Views  Result Date: 01/20/2019 CLINICAL DATA:  63 year old male with small bowel obstruction, nasogastric tube in place EXAM: ABDOMEN - 2 VIEW COMPARISON:  Prior abdominal radiograph 01/19/2019 FINDINGS: The tip of the nasogastric tube remains in similar position overlying the gastric fundus. Gas present throughout the colon without evidence of distention. There is been a slight interval reduction in the degree of small bowel dilatation. The maximal small bowel diameter today measures 6.5 cm in the left abdomen. There is thickening of the plicae. No evidence of free air on the upright radiograph. No acute osseous abnormality. IMPRESSION: 1. Persistent but improving bowel gas pattern. 2. There is 1 residual dilated loop of small bowel in the left hemiabdomen with evidence of submucosal thickening of the plicae circulares. This  suggests submucosal edema or less likely hemorrhage. 3. The tip of the nasogastric tube overlies the gastric fundus. Electronically Signed   By: Malachy Moan M.D.   On: 01/20/2019 08:34   Dg Abd Portable 1v-small Bowel Obstruction Protocol-initial, 8 Hr Delay  Result Date: 01/18/2019 CLINICAL DATA:  Small bowel obstruction. EXAM: PORTABLE ABDOMEN - 1 VIEW COMPARISON:  01/18/2019 at 0521 hours FINDINGS: Oral contrast is now present in the stomach. There is persistent dilatation of multiple small bowel loops predominantly centrally in the abdomen. Colonic gas has decreased from  the prior study. Minimal residual contrast is present in the bladder. IMPRESSION: Persistent small bowel dilatation consistent with obstruction. Oral contrast in the stomach. Electronically Signed   By: Sebastian Ache M.D.   On: 01/18/2019 19:13    Anti-infectives: Anti-infectives (From admission, onward)   None       Assessment/Plan HTN Asthma Thoracic aortic aneurysm  SBO - film this AM with some improvement but still some dilated bowel  - clinically passing flatus and had some diarrhea - clamp NGT and trial CLD, if tolerating can remove NGT this afternoon - ambulate!!  FEN CLD, clamp NGT, decrease IVF VTE: SCDs ID: no current abx   LOS: 1 day    Wells Guiles , [facility] 01/20/2019, 9:47 AM Pager: [phone] Consults: [phone]  [Corona virus days] Agree with above. Looks good.   NGT clamped - taking liquids.  I spoke to wife, Mariana Arn, and daughter, Essence, on the phone.  He apparently had one other possible bowel obstruction.  They said he came to the hospital, but was not admitted.  Ovidio Kin, MD, [facility] Pager: [phone] Office phone:  [phone]

## 2019-01-20 NOTE — Plan of Care (Signed)

## 2019-01-20 NOTE — Telephone Encounter (Signed)
Patient went to ED

## 2019-01-21 LAB — BASIC METABOLIC PANEL WITH GFR
Anion gap: 7 (ref 5–15)
BUN: 11 mg/dL (ref 8–23)
CO2: 21 mmol/L — ABNORMAL LOW (ref 22–32)
Calcium: 8 mg/dL — ABNORMAL LOW (ref 8.9–10.3)
Chloride: 108 mmol/L (ref 98–111)
Creatinine, Ser: 0.78 mg/dL (ref 0.61–1.24)
GFR calc Af Amer: >60 mL/min
GFR calc non Af Amer: >60 mL/min
Glucose, Bld: 92 mg/dL (ref 70–99)
Potassium: 3.4 mmol/L — ABNORMAL LOW (ref 3.5–5.1)
Sodium: 136 mmol/L (ref 135–145)

## 2019-01-21 MED ORDER — POTASSIUM CHLORIDE CRYS ER 20 MEQ PO TBCR
40.0000 meq | EXTENDED_RELEASE_TABLET | Freq: Once | ORAL | Status: AC
  Administered 2019-01-21: 40 meq via ORAL
  Filled 2019-01-21: qty 2

## 2019-01-21 MED ORDER — PANTOPRAZOLE SODIUM 40 MG PO TBEC: 40.0000 mg | DELAYED_RELEASE_TABLET | Freq: Every day | ORAL | Status: DC

## 2019-01-21 MED ORDER — DOCUSATE SODIUM 100 MG PO CAPS: 100.0000 mg | ORAL_CAPSULE | Freq: Every day | ORAL | Status: DC | PRN

## 2019-01-21 MED ORDER — POLYETHYLENE GLYCOL 3350 17 G PO PACK: 17.0000 g | PACK | Freq: Every day | ORAL | Status: DC | PRN

## 2019-01-21 NOTE — Progress Notes (Signed)
The patient is alert and oriented and has been seen by his physician. The orders for discharge were written. IV has been removed. Went over discharge instructions with patient. He is being walked down to his ride by nurse tech with all of his belongings.  

## 2019-01-21 NOTE — Discharge Instructions (Signed)
Bowel Obstruction °A bowel obstruction means that something is blocking the small or large bowel. The bowel is also called the intestine. It is the long tube that connects the stomach to the opening of the butt (anus). When something blocks the bowel, food and fluids cannot pass through like normal. This condition needs to be treated. Treatment depends on the cause of the problem and how bad the problem is. °What are the causes? °Common causes of this condition include: °· Scar tissue (adhesions) from past surgery or from high-energy X-rays (radiation). °· Recent surgery in the belly. This affects how food moves in the bowel. °· Some diseases, such as: °? Irritation of the lining of the digestive tract (Crohn's disease). °? Irritation of small pouches in the bowel (diverticulitis). °· Growths or tumors. °· A bulging organ (hernia). °· Twisting of the bowel (volvulus). °· A foreign body. °· Slipping of a part of the bowel into another part (intussusception). °What are the signs or symptoms? °Symptoms of this condition include: °· Pain in the belly. °· Feeling sick to your stomach (nauseous). °· Throwing up (vomiting). °· Bloating in the belly. °· Being unable to pass gas. °· Trouble pooping (constipation). °· Watery poop (diarrhea). °· A lot of belching. °How is this diagnosed? °This condition may be diagnosed based on: °· A physical exam. °· Medical history. °· Imaging tests, such as X-ray or CT scan. °· Blood tests. °· Urine tests. °How is this treated? °Treatment for this condition may include: °· Fluids and pain medicines that are given through an IV tube. Your doctor may tell you not to eat or drink if you feel sick to your stomach and are throwing up. °· Eating a clear liquid diet for a few days. °· Putting a small tube (nasogastric tube) into the stomach. This will help with pain, discomfort, and nausea by removing blocked air and fluids from the stomach. °· Surgery. This may be needed if other treatments do  not work. °Follow these instructions at home: °Medicines °· Take over-the-counter and prescription medicines only as told by your doctor. °· If you were prescribed an antibiotic medicine, take it as told by your doctor. Do not stop taking the antibiotic even if you start to feel better. °General instructions °· Follow your diet as told by your doctor. You may need to: °? Only drink clear liquids until you start to get better. °? Avoid solid foods. °· Return to your normal activities as told by your doctor. Ask your doctor what activities are safe for you. °· Do not sit for a long time without moving. Get up to take short walks every 1-2 hours. This is important. Ask for help if you feel weak or unsteady. °· Keep all follow-up visits as told by your doctor. This is important. °How is this prevented? °After having a bowel obstruction, you may be more likely to have another. You can do some things to stop it from happening again. °· If you have a long-term (chronic) disease, contact your doctor if you see changes or problems. °· Take steps to prevent or treat trouble pooping. Your doctor may ask that you: °? Drink enough fluid to keep your pee (urine) pale yellow. °? Take over-the-counter or prescription medicines. °? Eat foods that are high in fiber. These include beans, whole grains, and fresh fruits and vegetables. °? Limit foods that are high in fat and sugar. These include fried or sweet foods. °· Stay active. Ask your doctor which exercises are   safe for you. °· Avoid stress. °· Eat three small meals and three small snacks each day. °· Work with a food expert (dietitian) to make a meal plan that works for you. °· Do not use any products that contain nicotine or tobacco, such as cigarettes and e-cigarettes. If you need help quitting, ask your doctor. °Contact a doctor if: °· You have a fever. °· You have chills. °Get help right away if: °· You have pain or cramps that get worse. °· You throw up blood. °· You are  sick to your stomach. °· You cannot stop throwing up. °· You cannot drink fluids. °· You feel mixed up (confused). °· You feel very thirsty (dehydrated). °· Your belly gets more bloated. °· You feel weak or you pass out (faint). °Summary °· A bowel obstruction means that something is blocking the small or large bowel. °· Treatment may include IV fluids and pain medicine. You may also have a clear liquid diet, a small tube in your stomach, or surgery. °· Drink clear liquids and avoid solid foods until you get better. °This information is not intended to replace advice given to you by your health care provider. Make sure you discuss any questions you have with your health care provider. °Document Released: 11/16/2004 Document Revised: 02/20/2018 Document Reviewed: 02/20/2018 °Elsevier Interactive Patient Education © 2019 [employer] ° ° °Soft-Food Eating Plan °A soft-food eating plan includes foods that are safe and easy to chew and swallow. Your health care provider or dietitian can help you find foods and flavors that fit into this plan. Follow this plan until your health care provider or dietitian says it is safe to start eating other foods and food textures. °What are tips for following this plan? °General guidelines ° °· Take small bites of food, or cut food into pieces about ½ inch or smaller. Bite-sized pieces of food are easier to chew and swallow. °· Eat moist foods. Avoid overly dry foods. °· Avoid foods that: °? Are difficult to swallow, such as dry, chunky, crispy, or sticky foods. °? Are difficult to chew, such as hard, tough, or stringy foods. °? Contain nuts, seeds, or fruits. °· Follow instructions from your dietitian about the types of liquids that are safe for you to swallow. You may be allowed to have: °? Thick liquids only. This includes only liquids that are thicker than honey. °? Thin and thick liquids. This includes all beverages and foods that become liquid at room temperature. °· To make  thick liquids: °? Purchase a commercial liquid thickening powder. These are available at grocery stores and pharmacies. °? Mix the thickener into liquids according to instructions on the label. °? Purchase ready-made thickened liquids. °? Thicken soup by pureeing, straining to remove chunks, and adding flour, potato flakes, or corn starch. °? Add commercial thickener to foods that become liquid at room temperature, such as milk shakes, yogurt, ice cream, gelatin, and sherbet. °· Ask your health care provider whether you need to take a fiber supplement. °Cooking °· Cook meats so they stay tender and moist. Use methods like braising, stewing, or baking in liquid. °· Cook vegetables and fruit until they are soft enough to be mashed with a fork. °· Peel soft, fresh fruits such as peaches, nectarines, and melons. °· When making soup, make sure chunks of meat and vegetables are smaller than ½ inch. °· Reheat leftover foods slowly so that a tough crust does not form. °What foods are allowed? °The items listed below   may not be a complete list. Talk with your dietitian about what dietary choices are best for you. °Grains °Breads, muffins, pancakes, or waffles moistened with syrup, jelly, or butter. Dry cereals well-moistened with milk. Moist, cooked cereals. Well-cooked pasta and rice. °Vegetables °All soft-cooked vegetables. Shredded lettuce. °Fruits °All canned and cooked fruits. Soft, peeled fresh fruits. Strawberries. °Dairy °Milk. Cream. Yogurt. Cottage cheese. Soft cheese without the rind. °Meats and other protein foods °Tender, moist ground meat, poultry, or fish. Meat cooked in gravy or sauces. Eggs. °Sweets and desserts °Ice cream. Milk shakes. Sherbet. Pudding. °Fats and oils °Butter. Margarine. Olive, canola, sunflower, and grapeseed oil. Smooth salad dressing. Smooth cream cheese. Mayonnaise. Gravy. °What foods are not allowed? °The items listed bemay not be a complete list. Talk with your dietitian about what  dietary choices are best for you. °Grains °Coarse or dry cereals, such as bran, granola, and shredded wheat. Tough or chewy crusty breads, such as French bread or baguettes. Breads with nuts, seeds, or fruit. °Vegetables °All raw vegetables. Cooked corn. Cooked vegetables that are tough or stringy. Tough, crisp, fried potatoes and potato skins. °Fruits °Fresh fruits with skins or seeds, or both, such as apples, pears, and grapes. Stringy, high-pulp fruits, such as papaya, pineapple, coconut, and mango. Fruit leather and all dried fruit. °Dairy °Yogurt with nuts or coconut. °Meats and other protein foods °Hard, dry sausages. Dry meat, poultry, or fish. Meats with gristle. Fish with bones. Fried meat or fish. Lunch meat and hotdogs. Nuts and seeds. Chunky peanut butter or other nut butters. °Sweets and desserts °Cakes or cookies that are very dry or chewy. Desserts with dried fruit, nuts, or coconut. Fried pastries. Very rich pastries. °Fats and oils °Cream cheese with fruit or nuts. Salad dressings with seeds or chunks. °Summary °· A soft-food eating plan includes foods that are safe and easy to swallow. Generally, the foods should be soft enough to be mashed with a fork. °· Avoid foods that are dry, hard to chew, crunchy, sticky, stringy, or crispy. °· Ask your health care provider whether you need to thicken your liquids and if you need to take a fiber supplement. °This information is not intended to replace advice given to you by your health care provider. Make sure you discuss any questions you have with your health care provider. °Document Released: 01/16/2008 Document Revised: 12/12/2016 Document Reviewed: 12/12/2016 °Elsevier Interactive Patient Education © 2019 [employer] ° °

## 2019-01-21 NOTE — Discharge Summary (Addendum)
[facility] Discharge Summary   Patient ID: Ronald Harding MRN: [id number] DOB/AGE: May 24, 1956 63 y.o.  Admit date: 01/17/2019 Discharge date: 01/21/2019  Admitting Diagnosis: SBO  Discharge Diagnosis Patient Active Problem List   Diagnosis Date Noted  . SBO (small bowel obstruction) (HCC) 01/18/2019  . Thoracic aortic aneurysm without rupture (HCC) 06/06/2018  . Greater trochanteric bursitis of left hip 08/10/2017  . Routine general medical examination at a health care facility 06/30/2016  . Thoracic ascending aortic aneurysm (HCC) 08/20/2015  . Essential hypertension 06/30/2015  . Morbid obesity (HCC) 06/30/2015  . Cough 06/30/2015    Consultants None  Imaging: Dg Abd 2 Views  Result Date: 01/20/2019 CLINICAL DATA:  63 year old male with small bowel obstruction, nasogastric tube in place EXAM: ABDOMEN - 2 VIEW COMPARISON:  Prior abdominal radiograph 01/19/2019 FINDINGS: The tip of the nasogastric tube remains in similar position overlying the gastric fundus. Gas present throughout the colon without evidence of distention. There is been a slight interval reduction in the degree of small bowel dilatation. The maximal small bowel diameter today measures 6.5 cm in the left abdomen. There is thickening of the plicae. No evidence of free air on the upright radiograph. No acute osseous abnormality. IMPRESSION: 1. Persistent but improving bowel gas pattern. 2. There is 1 residual dilated loop of small bowel in the left hemiabdomen with evidence of submucosal thickening of the plicae circulares. This suggests submucosal edema or less likely hemorrhage. 3. The tip of the nasogastric tube overlies the gastric fundus. Electronically Signed   By: Malachy Moan M.D.   On: 01/20/2019 08:34    Procedures None  Hospital Course:  Patient is a 63 year old male who presented to [facility] with abdominal pain.  Workup showed sbo.  Patient was admitted and underwent small bowel protocol.  Diet was advanced as tolerated.  On 01/21/19, the patient was voiding well, tolerating diet, ambulating well, pain well controlled, vital signs stable and felt stable for discharge home.  Patient will follow up with PCP as needed and knows to call with questions or concerns.   Physical Exam: Gen:  Alert, NAD, pleasant Card:  Regular rate and rhythm Pulm:  Normal effort, clear to auscultation bilaterally Abd: Soft, non-tender, non-distended, +BS Skin: warm and dry, no rashes  Psych: A&Ox3   Allergies as of 01/21/2019      Reactions   Betadine [povidone Iodine] Hives   Pt states in 2012 in [place] he fell and broke several bones. Betadine broke him out.       Medication List    STOP taking these medications   amoxicillin-clavulanate 875-125 MG tablet Commonly known as:  AUGMENTIN     TAKE these medications   AIRBORNE GUMMIES PO Take 2 tablets by mouth daily.   albuterol 108 (90 Base) MCG/ACT inhaler Commonly known as:  PROVENTIL HFA;VENTOLIN HFA Inhale 2 puffs into the lungs every 6 (six) hours as needed for wheezing or shortness of breath.   aspirin 81 MG tablet Take 81 mg by mouth daily.   docusate sodium 100 MG capsule Commonly known as:  Colace Take 1 capsule (100 mg total) by mouth daily as needed for mild constipation.   losartan-hydrochlorothiazide 50-12.5 MG tablet Commonly known as:  HYZAAR Take 1 tablet by mouth daily.   polyethylene glycol packet Commonly known as:  MiraLax Take 17 g by mouth daily as needed for mild constipation.        Follow-up Information    Myrlene Broker, MD  Follow up.   Specialty:  Internal Medicine Why:  Call in 1-2 weeks to discuss how you are doing post-bowel obstruction.  Contact information: [address] [place] [address] [phone]           Signed: Wells Guiles, [facility] 01/21/2019, 9:21 AM Pager: [phone] Consults: [phone]  [Corona virus days] Agree with  above.  Ovidio Kin, MD, [facility] Pager: [phone] Office phone:  [phone]

## 2019-01-21 NOTE — TOC Transition Note (Signed)
Transition of Care [facility]) - CM/SW Discharge Note   Patient Details  Name: Ronald Harding MRN: [id number] Date of Birth: Nov 10, 1955  Transition of Care [facility]) CM/SW Contact:  Golda Acre, RN Phone Number: 01/21/2019, 11:03 AM   Clinical Narrative:    Discharged to home with self-care, orders checked for hhc needs. No TOC needs present at time of discharge.  Patient is able to arrangement own appointments and home care.   Final next level of care: Home/Self Care Barriers to Discharge: No Barriers Identified   Patient Goals and CMS Choice Patient states their goals for this hospitalization and ongoing recovery are:: to not come back      Discharge Placement                       Discharge Plan and Services   Discharge Planning Services: CM Consult                      Social Determinants of Health (SDOH) Interventions     Readmission Risk Interventions No flowsheet data found.

## 2019-01-22 ENCOUNTER — Telehealth: Payer: Self-pay | Admitting: *Deleted

## 2019-01-22 NOTE — Telephone Encounter (Signed)
Rec'd TCM report pt was admitted 01/17/19 for abdominal pain. Work-up showed bowel obstruction,  and he underwent small bowel protocol. Pt D/C 01/21/19, and was advise for f/u w/PCP for post-bowel obstruction. Pls advise if virtual or office visit.Marland KitchenRaechel Chute

## 2019-01-23 NOTE — Telephone Encounter (Signed)
Can do virtual if able

## 2019-01-23 NOTE — Telephone Encounter (Signed)
Called pt to make hosp f/u appt. Verified if he is able to do virtual appt, which he states he is. Verified email address and it is correct. Completed TCM call below.Raechel Chute  Transition Care Management Follow-up Telephone Call   Date discharged? 01/21/19   How have you been since you were released from the hospital? Pt states he is doing great   Do you understand why you were in the hospital? YES   Do you understand the discharge instructions? YES   Where were you discharged to? Home   Items Reviewed:  Medications reviewed: YES, pt states no changes  Allergies reviewed: YES  Dietary changes reviewed: YES  Referrals reviewed: No referral recommeded   Functional Questionnaire:   Activities of Daily Living (ADLs):   He states he are independent in the following: ambulation, bathing and hygiene, feeding, continence, grooming, toileting and dressing States he doesn't require assistance    Any transportation issues/concerns?: NO   Any patient concerns? NO   Confirmed importance and date/time of follow-up visits scheduled YES, virtual appt 01/30/19  Provider Appointment booked with Dr. Okey Dupre  Confirmed with patient if condition begins to worsen call PCP or go to the ER.  Patient was given the office number and encouraged to call back with question or concerns.  : YES

## 2019-01-30 ENCOUNTER — Ambulatory Visit (INDEPENDENT_AMBULATORY_CARE_PROVIDER_SITE_OTHER): Payer: Medicare HMO | Admitting: Internal Medicine

## 2019-01-30 ENCOUNTER — Encounter: Payer: Self-pay | Admitting: Internal Medicine

## 2019-01-30 DIAGNOSIS — K56609 Unspecified intestinal obstruction, unspecified as to partial versus complete obstruction: Secondary | ICD-10-CM | POA: Diagnosis not present

## 2019-01-30 NOTE — Progress Notes (Signed)
Virtual Visit via Video Note  I connected with Ronald Harding on 01/30/19 at  2:00 PM EDT by a video enabled telemedicine application and verified that I am speaking with the correct person using two identifiers.   I discussed the limitations of evaluation and management by telemedicine and the availability of in person appointments. The patient expressed understanding and agreed to proceed.  History of Present Illness: The patient is a 63 y.o. man with visit for hospital follow up (in for SBO with NG tube and decompression with good resolution, did not require surgery). Has good appetite and 2-3 BM per day now. Denies blood in stool. Denies nausea or vomiting. Denies SOB or cough or fevers or chills. Overall it is imprved. Has tried nothing  Observations/Objective: Appearance: normal, breathing appears normal, casual grooming, abdomen does not appear distended, throat normal, mental status is A and O times 3  Assessment and Plan: See problem oriented charting  Follow Up Instructions: monitor symptoms and call back or return for symptoms  I discussed the assessment and treatment plan with the patient. The patient was provided an opportunity to ask questions and all were answered. The patient agreed with the plan and demonstrated an understanding of the instructions.   The patient was advised to call back or seek an in-person evaluation if the symptoms worsen or if the condition fails to improve as anticipated.  Myrlene Broker, MD

## 2019-01-30 NOTE — Assessment & Plan Note (Signed)
Resolved and 2-3 BM per day. No blood in stool. Given pandemic will hold off on labs.

## 2019-03-06 DIAGNOSIS — H5203 Hypermetropia, bilateral: Secondary | ICD-10-CM | POA: Diagnosis not present

## 2019-03-06 DIAGNOSIS — H52209 Unspecified astigmatism, unspecified eye: Secondary | ICD-10-CM | POA: Diagnosis not present

## 2019-03-06 DIAGNOSIS — H524 Presbyopia: Secondary | ICD-10-CM | POA: Diagnosis not present

## 2019-03-19 ENCOUNTER — Other Ambulatory Visit: Payer: Self-pay | Admitting: Gastroenterology

## 2019-03-21 ENCOUNTER — Other Ambulatory Visit: Payer: Self-pay | Admitting: Gastroenterology

## 2019-04-11 ENCOUNTER — Other Ambulatory Visit (HOSPITAL_COMMUNITY): Payer: Medicare HMO

## 2019-04-11 NOTE — Progress Notes (Signed)
Spoke with Ronald Harding today. He states he is canceling his procedure at [facility] with Dr Collene Mares. Therefore no need for Covid 19 test today.  I will contact Dr Collene Mares to make him aware Ronald Harding wants to cancel.

## 2019-04-14 ENCOUNTER — Encounter (HOSPITAL_COMMUNITY): Payer: Self-pay

## 2019-04-15 ENCOUNTER — Ambulatory Visit (HOSPITAL_COMMUNITY): Admission: RE | Admit: 2019-04-15 | Payer: Medicare HMO | Source: Home / Self Care | Admitting: Gastroenterology

## 2019-04-15 HISTORY — DX: Unspecified intestinal obstruction, unspecified as to partial versus complete obstruction: K56.609

## 2019-04-15 SURGERY — COLONOSCOPY WITH PROPOFOL
Anesthesia: Monitor Anesthesia Care

## 2019-04-17 ENCOUNTER — Other Ambulatory Visit: Payer: Self-pay | Admitting: Family Medicine

## 2019-06-24 ENCOUNTER — Other Ambulatory Visit: Payer: Self-pay | Admitting: *Deleted

## 2019-06-24 DIAGNOSIS — I712 Thoracic aortic aneurysm, without rupture, unspecified: Secondary | ICD-10-CM

## 2019-07-02 ENCOUNTER — Other Ambulatory Visit: Payer: Self-pay | Admitting: Cardiothoracic Surgery

## 2019-07-02 DIAGNOSIS — I712 Thoracic aortic aneurysm, without rupture, unspecified: Secondary | ICD-10-CM

## 2019-07-14 ENCOUNTER — Ambulatory Visit (HOSPITAL_COMMUNITY): Payer: Medicare HMO | Attending: Cardiology

## 2019-07-14 ENCOUNTER — Other Ambulatory Visit: Payer: Self-pay

## 2019-07-14 DIAGNOSIS — I712 Thoracic aortic aneurysm, without rupture, unspecified: Secondary | ICD-10-CM

## 2019-08-03 ENCOUNTER — Other Ambulatory Visit: Payer: Self-pay | Admitting: Internal Medicine

## 2019-08-03 DIAGNOSIS — I7121 Aneurysm of the ascending aorta, without rupture: Secondary | ICD-10-CM

## 2019-08-03 DIAGNOSIS — I712 Thoracic aortic aneurysm, without rupture: Secondary | ICD-10-CM

## 2019-08-08 DIAGNOSIS — R69 Illness, unspecified: Secondary | ICD-10-CM | POA: Diagnosis not present

## 2019-08-09 ENCOUNTER — Ambulatory Visit: Payer: Medicare HMO

## 2019-08-13 ENCOUNTER — Ambulatory Visit (INDEPENDENT_AMBULATORY_CARE_PROVIDER_SITE_OTHER): Payer: Medicare HMO | Admitting: Cardiothoracic Surgery

## 2019-08-13 ENCOUNTER — Other Ambulatory Visit: Payer: Self-pay

## 2019-08-13 ENCOUNTER — Ambulatory Visit
Admission: RE | Admit: 2019-08-13 | Discharge: 2019-08-13 | Disposition: A | Discharge status: Home / Self Care | Payer: Medicare HMO | Source: Ambulatory Visit | Attending: Cardiothoracic Surgery | Admitting: Cardiothoracic Surgery

## 2019-08-13 ENCOUNTER — Encounter: Payer: Self-pay | Admitting: Cardiothoracic Surgery

## 2019-08-13 VITALS — BP 129/74 | HR 78 | Temp 97.9°F | Resp 16 | Ht 71.0 in | Wt 332.2 lb

## 2019-08-13 DIAGNOSIS — I712 Thoracic aortic aneurysm, without rupture, unspecified: Secondary | ICD-10-CM

## 2019-08-13 DIAGNOSIS — I7121 Aneurysm of the ascending aorta, without rupture: Secondary | ICD-10-CM

## 2019-08-13 MED ORDER — IOPAMIDOL (ISOVUE-370) INJECTION 76%
75.0000 mL | Freq: Once | INTRAVENOUS | Status: AC | PRN
  Administered 2019-08-13: 75 mL via INTRAVENOUS

## 2019-08-13 NOTE — Progress Notes (Signed)
PCP is Myrlene Broker, MD Referring Provider is Quentin Mulling, MD  Chief Complaint  Patient presents with  . Thoracic Aortic Aneurysm    1 year f/u with Chest CTA    HPI: Patient returns for scheduled visit with annual CTA follow-up of a stable asymptomatic 4.6 cm fusiform ascending aneurysm.  Patient is compliant with his blood pressure medication-losartan.  He denies chest pain.  He denies symptoms of CHF.  CTA today shows stable 4.6 cm fusiform ascending thoracic aneurysm.  There is no evidence of mural hematoma or ulceration.  Lung windows show no at risk pulmonary nodules, no mediastinal abnormal adenopathy.  Last month the patient had an echocardiogram which shows a normal trileaflet aortic valve without aortic insufficiency or stenosis.  LV function is normal.   Past Medical History:  Diagnosis Date  . 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 Asthma    hx of-as a child  . History of colon polyps    benign  . Hypertension    takes Hyzaar daily  . Joint pain   . Pneumonia    hx of-in the 70's  . SBO (small bowel obstruction) (HCC)    with NG tube placement    Past Surgical History:  Procedure Laterality Date  . ANKLE SURGERY Right 2012  . COLONOSCOPY    . MULTIPLE EXTRACTIONS WITH ALVEOLOPLASTY N/A 09/23/2015   Procedure: Extraction of tooth #'s 1-12,14- 28, 30-32 with alveoloplasty, bilateral mandibular tori reductions and reduction of lateral exostoses.;  Surgeon: Charlynne Pander, DDS;  Location: [facility];  Service: Oral Surgery;  Laterality: N/A;  . NOSE SURGERY  2013    Family History  Problem Relation Age of Onset  . Hypertension Mother   . Arthritis Mother   . Hypertension Father   . Heart disease Father   . Arthritis Father     Social History Social History   Tobacco Use  . Smoking status: Former Games developer  . Smokeless tobacco: Current User    Types: Chew  . Tobacco comment: one pack of chew per week   Substance Use Topics  . Alcohol use: No    Alcohol/week: 0.0 standard drinks  . Drug use: No    Current Outpatient Medications  Medication Sig Dispense Refill  . albuterol (PROVENTIL HFA;VENTOLIN HFA) 108 (90 Base) MCG/ACT inhaler Inhale 2 puffs into the lungs every 6 (six) hours as needed for wheezing or shortness of breath. 1 Inhaler 2  . aspirin 81 MG tablet Take 81 mg by mouth daily.    Marland Kitchen losartan-hydrochlorothiazide (HYZAAR) 50-12.5 MG tablet Take 1 tablet by mouth daily. Need office visit for further refills 30 tablet 0  . docusate sodium (COLACE) 100 MG capsule Take 1 capsule (100 mg total) by mouth daily as needed for mild constipation. (Patient not taking: Reported on 08/13/2019)    . Multiple Vitamins-Minerals (AIRBORNE GUMMIES PO) Take 2 tablets by mouth daily.    . polyethylene glycol (MIRALAX) packet Take 17 g by mouth daily as needed for mild constipation. (Patient not taking: Reported on 08/13/2019)     No current facility-administered medications for this visit.     Allergies  Allergen Reactions  . Betadine [Povidone Iodine] Hives    Pt states in 2012 in [place] he fell and broke several bones. Betadine broke him out.     Review of Systems   Weight stable No headache or change in vision No dental complaints or difficulty swallowing No  chest pain or shortness of breath No abdominal pain or change in bowel habits No nocturia dysuria or hematuria   BP 129/74 (BP Location: Left Arm)   Pulse 78   Temp 97.9 F (36.6 C) (Skin) Comment: RA  Resp 16   Ht 5\' 11"  (1.803 m)   Wt (!) 332 lb 3.2 oz (150.7 kg)   SpO2 96% Comment: RA  BMI 46.33 kg/m  Physical Exam     Physical Exam  General: Very nice but morbidly obese male no acute distress HEENT: Normocephalic pupils equal , dentition adequate Neck: Supple without JVD, adenopathy, or bruit Chest: Clear to auscultation, symmetrical breath sounds, no rhonchi, no tenderness             or  deformity Cardiovascular: Regular rate and rhythm, no murmur, no gallop, peripheral pulses             palpable in all extremities Abdomen:  Soft, nontender, no palpable mass or organomegaly Extremities: Warm, well-perfused, no clubbing cyanosis edema or tenderness,              no venous stasis changes of the legs Rectal/GU: Deferred Neuro: Grossly non--focal and symmetrical throughout Skin: Clean and dry without rash or ulceration   Diagnostic Tests: CTA images personally reviewed and discussed with patient  Impression: Doing well with blood pressure control. 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 Risk of dissection is less than 1% with current aortic diameter and other factors.  Plan: Return in 1 year with surveillance CT scan of aorta Patient understands importance of blood pressure control. Len Childs, MD [facility] [phone]

## 2019-09-03 ENCOUNTER — Other Ambulatory Visit: Payer: Self-pay | Admitting: Internal Medicine

## 2019-09-03 DIAGNOSIS — I7121 Aneurysm of the ascending aorta, without rupture: Secondary | ICD-10-CM

## 2019-09-03 DIAGNOSIS — I712 Thoracic aortic aneurysm, without rupture: Secondary | ICD-10-CM

## 2019-09-03 IMAGING — MR MR LUMBAR SPINE W/O CM
4 of 5 series · 17 of 48 positions shown · non-contrast
Comparison: Comparison made with prior radiographs from 09/18/2017.

CLINICAL DATA: Initial evaluation for back pain with left-sided
sciatica.

EXAM:
MRI LUMBAR SPINE WITHOUT CONTRAST
TECHNIQUE: Multiplanar, multisequence MR imaging of the lumbar spine was
performed. No intravenous contrast was administered.

[Series 7: T1 · sagittal · 4.0mm · 0.73mm/px · 3 of 15 slices shown (1 of 2)]
[im 3/15]
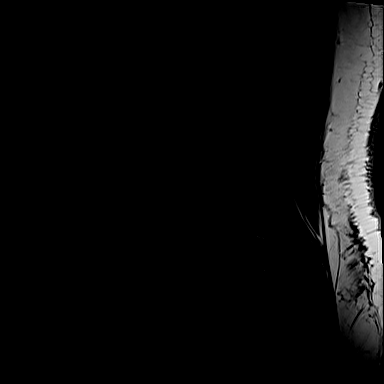
[im 9/15]
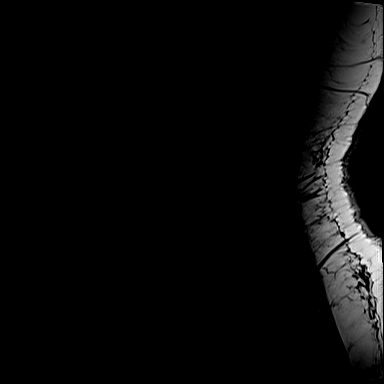
[im 15/15]
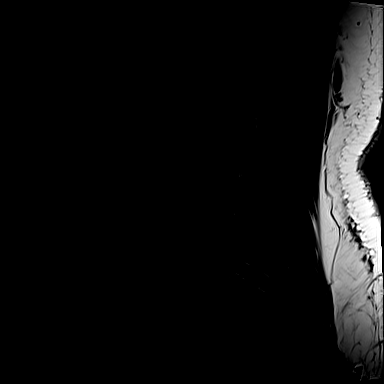

[Series 9: T2 · sagittal · 4.0mm · 0.73mm/px · 5 of 15 slices shown (1 of 2)]
[im 1/15]
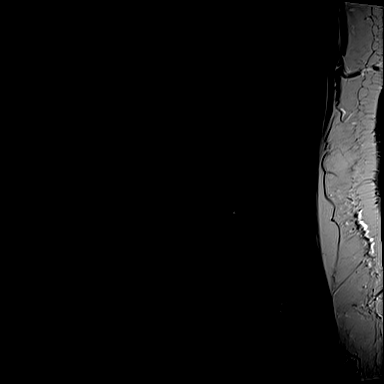
[im 4/15]
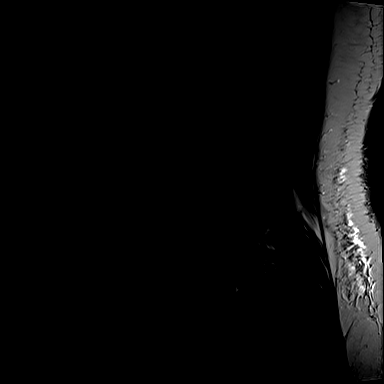
[im 8/15]
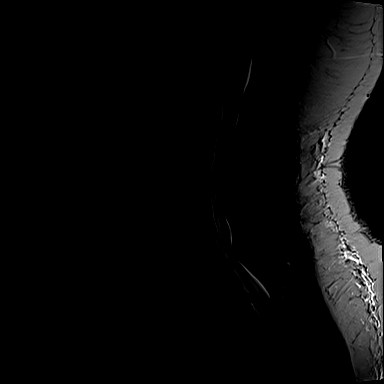
[im 11/15]
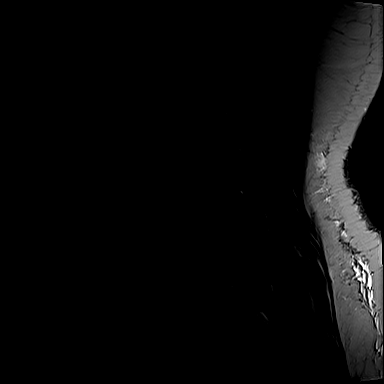
[im 15/15]
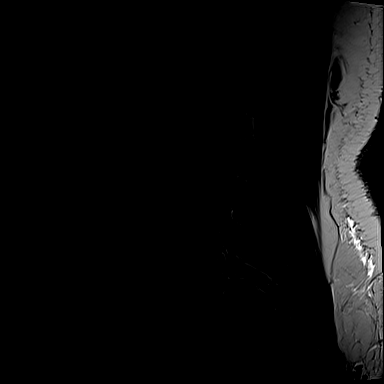

[Series 14: T2 · axial · 4.0mm · 0.28mm/px · z∈[-10,+188]mm · 6 of 45 slices shown (2 of 2)]
[im 3/45]
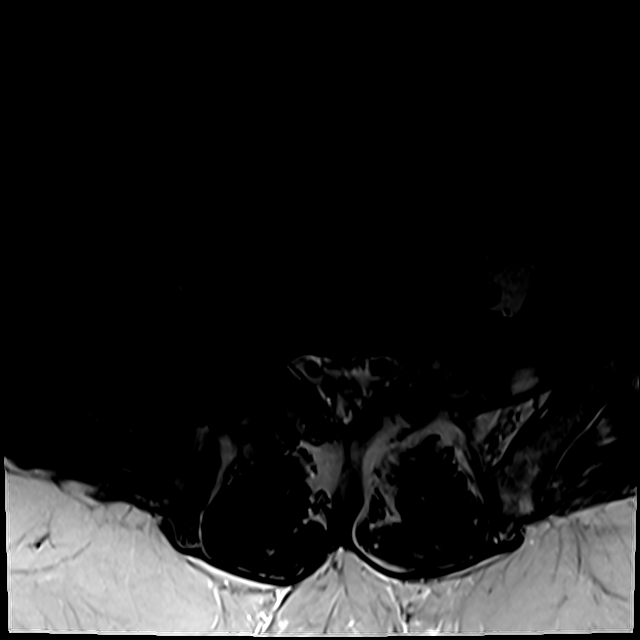
[im 6/45]
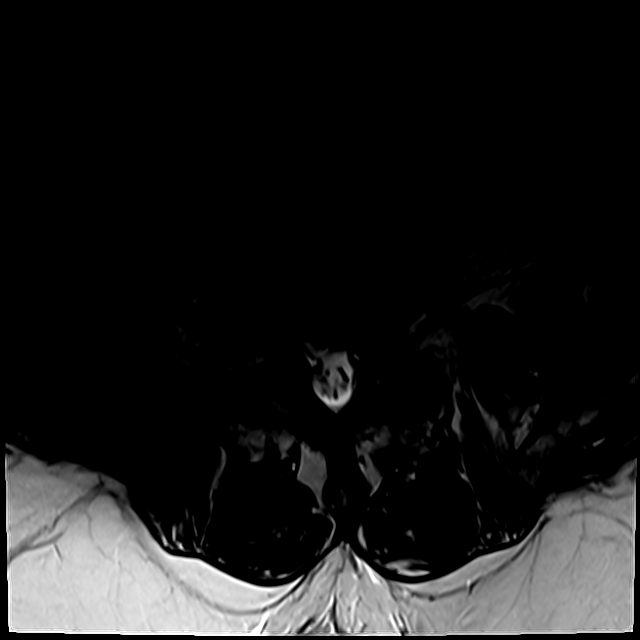
[im 9/45]
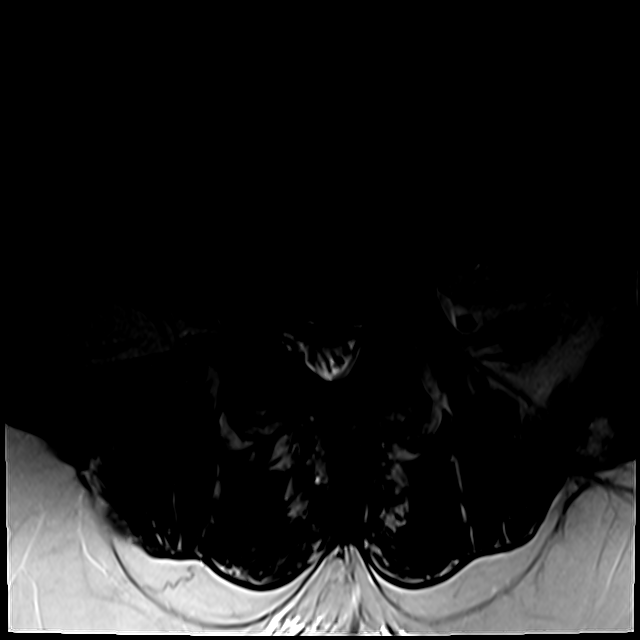
[im 15/45]
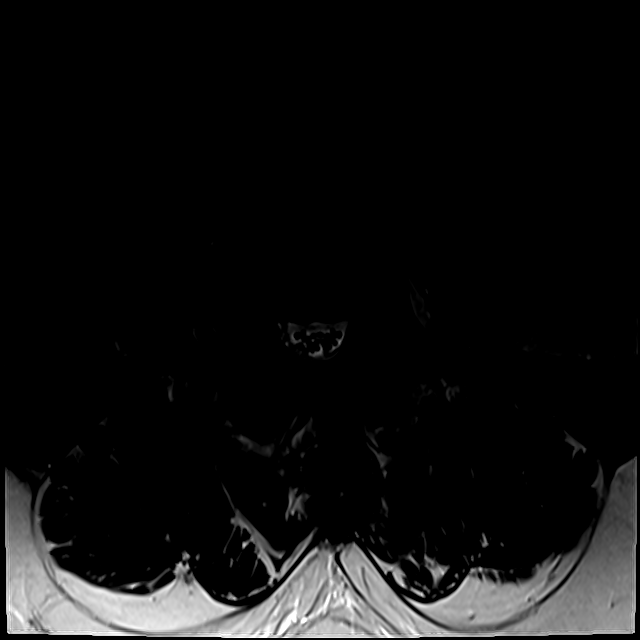
[im 24/45]
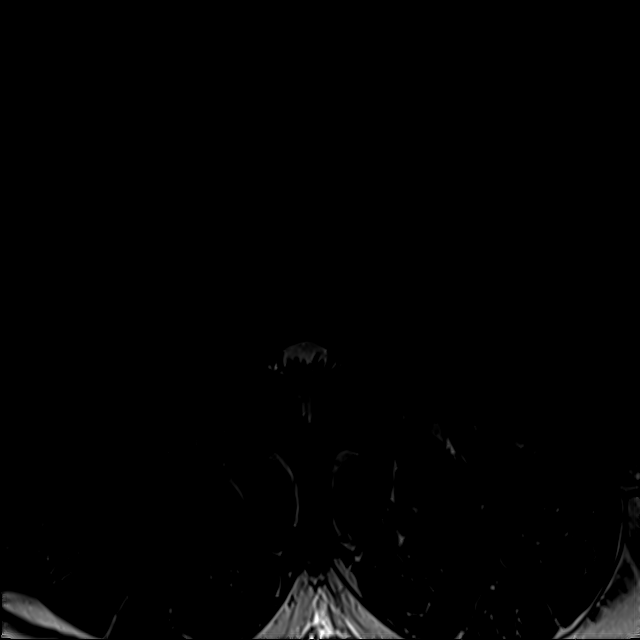
[im 39/45]
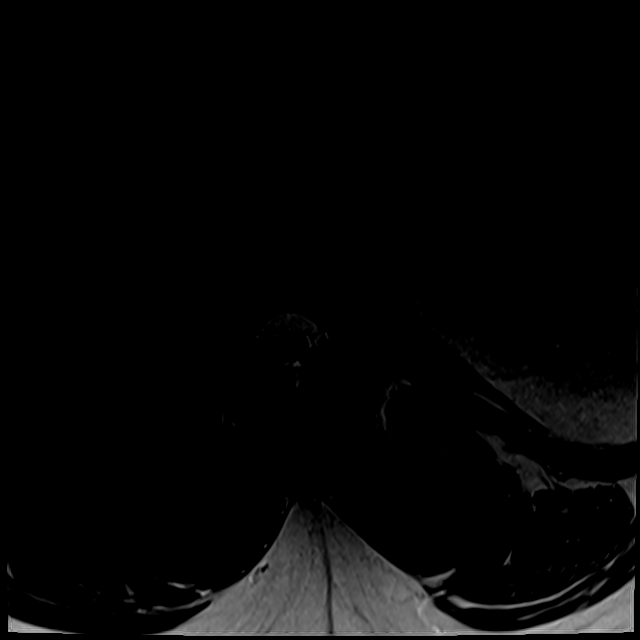

[Series 100: T1 · axial · 4.0mm · 0.28mm/px · z∈[+4,+188]mm · 3 of 45 slices shown (2 of 2)]
[im 6/45]
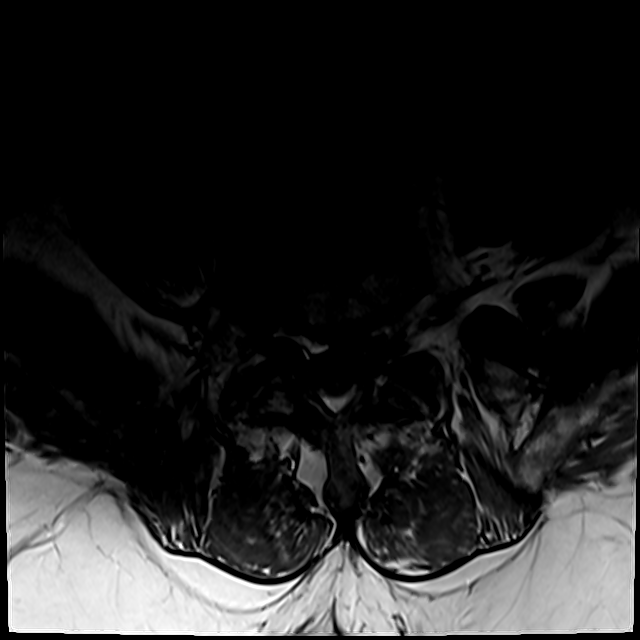
[im 24/45]
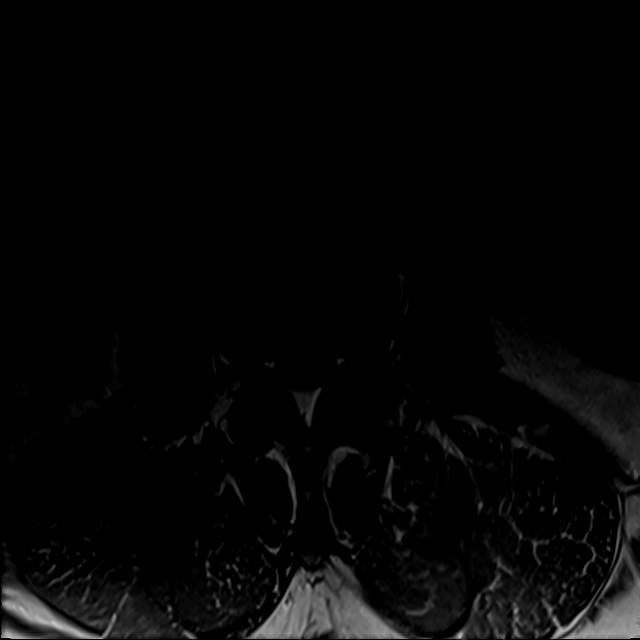
[im 39/45]
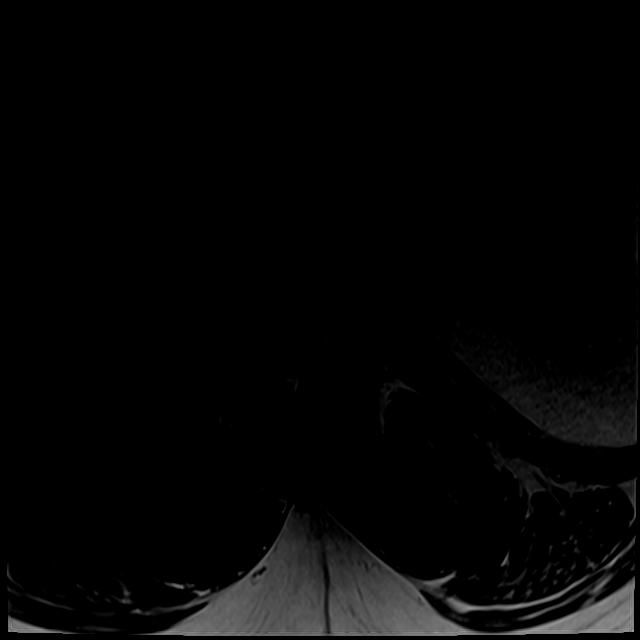

[17 of 48 positions shown; findings below may reference images not displayed]

FINDINGS: Segmentation:  Study degraded by motion artifact.

Normal segmentation. Lowest well-formed disc labeled the L5-S1
level.

Alignment: Vertebral bodies normally aligned with preservation of
the normal lumbar lordosis. No listhesis or subluxation.

Vertebrae: Vertebral body heights are maintained without evidence
for acute or chronic fracture. Bone marrow signal intensity is
diffusely heterogeneous without discrete or worrisome osseous
lesions. Prominent reactive endplate changes noted about the L5-S1
interspace related bulky bridging anterior osteophytes. Fluid signal
intensity within the L5-S1 disc without endplate erosion, likely
degenerative in nature. Mild reactive edema about the bilateral L4-5
facets due to facet arthritis.

Conus medullaris and cauda equina: Conus extends to the L1 level.
Conus and cauda equina appear normal.

Paraspinal and other soft tissues: Paraspinous soft tissues
demonstrate no acute abnormality. Visualized visceral structures
within normal limits.

Disc levels:

L1-2: Mild diffuse disc bulge with disc desiccation. Mild facet and
ligament flavum hypertrophy. No significant canal stenosis. Foramina
remain patent. No impingement.

L2-3: Normal interspace. Moderate facet and ligament flavum
hypertrophy. No significant canal or foraminal stenosis. No
impingement.

L3-4: Disc desiccation. Shallow right foraminal/ extraforaminal disc
protrusion closely approximates the exiting right L3 nerve root
without frank impingement (series 9, image 3). Associated annular
fissure. Moderate to advanced facet and ligament flavum hypertrophy.
No significant canal or lateral recess narrowing. Mild right L3
foraminal stenosis. No significant (encroachment.

L4-5: Diffuse disc bulge with disc desiccation. Severe bilateral
facet arthrosis with ligamentum flavum thickening. Resultant
moderate bilateral lateral recess and foraminal stenosis,
potentially affecting either of the L4 or descending L5 nerve roots.
Mild narrowing of the central canal.

L5-S1: Chronic intervertebral disc space narrowing with diffuse disc
bulge. Extensive reactive endplate changes with bridging bulky
osteophytic spurring and partial ankylosis of the L5-S1 interspace
anteriorly. Broad posterior disc osteophyte flattens the ventral
thecal sac. Moderate facet hypertrophy. No significant canal
stenosis. Moderate bilateral L5 foraminal narrowing, right worse
than left.
IMPRESSION: 1. Degenerative disc bulge with advanced facet arthropathy at L4-5
with resultant bilateral subarticular and foraminal stenosis. Either
of the L4 or L5 nerve roots could be affected bilaterally.
2. Degenerative disc osteophyte with facet hypertrophy at L5-S1 with
resultant moderate bilateral L5 foraminal narrowing, right worse
than left.
3. Shallow right foraminal/extraforaminal disc protrusion at L3-4,
closely approximating and potentially irritating the exiting right
L3 nerve root.

## 2019-09-08 ENCOUNTER — Other Ambulatory Visit: Payer: Self-pay | Admitting: Internal Medicine

## 2019-09-08 DIAGNOSIS — I712 Thoracic aortic aneurysm, without rupture: Secondary | ICD-10-CM

## 2019-09-08 DIAGNOSIS — I7121 Aneurysm of the ascending aorta, without rupture: Secondary | ICD-10-CM

## 2019-09-09 ENCOUNTER — Telehealth: Payer: Self-pay | Admitting: Internal Medicine

## 2019-09-09 MED ORDER — LOSARTAN POTASSIUM-HCTZ 50-12.5 MG PO TABS: 1.0000 | ORAL_TABLET | Freq: Every day | ORAL | 0 refills | Status: DC

## 2019-09-09 NOTE — Telephone Encounter (Signed)
Pt called in and stated he is out of this med and he wants to know why no one called him to make an appt.  He stated he does not want to make an appt and just wants the refill.  He stated that is why he didn't really want to go on this med.  Please advise

## 2019-09-09 NOTE — Addendum Note (Signed)
Addended by: Cresenciano Lick on: 09/09/2019 04:22 PM   Modules accepted: Orders

## 2019-09-09 NOTE — Telephone Encounter (Signed)
[facility] scheduler to contact pt to schedule annual OV with PCP. LOV was 07/26/18.

## 2019-09-09 NOTE — Telephone Encounter (Signed)
OV has been scheduled for 09/10/19. Losartan/HCTZ 50-12.5 mg # 30 sent. See meds.

## 2019-09-10 ENCOUNTER — Other Ambulatory Visit (INDEPENDENT_AMBULATORY_CARE_PROVIDER_SITE_OTHER): Payer: Medicare HMO

## 2019-09-10 ENCOUNTER — Ambulatory Visit (INDEPENDENT_AMBULATORY_CARE_PROVIDER_SITE_OTHER): Payer: Medicare HMO | Admitting: Internal Medicine

## 2019-09-10 ENCOUNTER — Encounter: Payer: Self-pay | Admitting: Internal Medicine

## 2019-09-10 ENCOUNTER — Other Ambulatory Visit: Payer: Self-pay

## 2019-09-10 VITALS — BP 130/84 | HR 79 | Temp 98.2°F | Ht 71.0 in | Wt 340.0 lb

## 2019-09-10 DIAGNOSIS — Z0000 Encounter for general adult medical examination without abnormal findings: Secondary | ICD-10-CM

## 2019-09-10 DIAGNOSIS — I712 Thoracic aortic aneurysm, without rupture: Secondary | ICD-10-CM

## 2019-09-10 DIAGNOSIS — I7121 Aneurysm of the ascending aorta, without rupture: Secondary | ICD-10-CM

## 2019-09-10 DIAGNOSIS — I10 Essential (primary) hypertension: Secondary | ICD-10-CM

## 2019-09-10 LAB — COMPREHENSIVE METABOLIC PANEL WITH GFR
ALT: 19 U/L (ref 0–53)
AST: 18 U/L (ref 0–37)
Albumin: 4 g/dL (ref 3.5–5.2)
Alkaline Phosphatase: 101 U/L (ref 39–117)
BUN: 15 mg/dL (ref 6–23)
CO2: 25 meq/L (ref 19–32)
Calcium: 9.1 mg/dL (ref 8.4–10.5)
Chloride: 106 meq/L (ref 96–112)
Creatinine, Ser: 0.9 mg/dL (ref 0.40–1.50)
GFR: 103.06 mL/min
Glucose, Bld: 102 mg/dL — ABNORMAL HIGH (ref 70–99)
Potassium: 3.8 meq/L (ref 3.5–5.1)
Sodium: 139 meq/L (ref 135–145)
Total Bilirubin: 0.5 mg/dL (ref 0.2–1.2)
Total Protein: 7.2 g/dL (ref 6.0–8.3)

## 2019-09-10 LAB — LIPID PANEL
Cholesterol: 133 mg/dL (ref 0–200)
HDL: 30.2 mg/dL — ABNORMAL LOW
LDL Cholesterol: 75 mg/dL (ref 0–99)
NonHDL: 102.91
Total CHOL/HDL Ratio: 4
Triglycerides: 138 mg/dL (ref 0.0–149.0)
VLDL: 27.6 mg/dL (ref 0.0–40.0)

## 2019-09-10 LAB — CBC
HCT: 41.6 % (ref 39.0–52.0)
Hemoglobin: 13.8 g/dL (ref 13.0–17.0)
MCHC: 33.1 g/dL (ref 30.0–36.0)
MCV: 87.8 fl (ref 78.0–100.0)
Platelets: 362 10*3/uL (ref 150.0–400.0)
RBC: 4.74 Mil/uL (ref 4.22–5.81)
RDW: 14.1 % (ref 11.5–15.5)
WBC: 7.5 10*3/uL (ref 4.0–10.5)

## 2019-09-10 LAB — PSA: PSA: 1.4 ng/mL (ref 0.10–4.00)

## 2019-09-10 LAB — HEMOGLOBIN A1C: Hgb A1c MFr Bld: 6.2 % (ref 4.6–6.5)

## 2019-09-10 MED ORDER — LOSARTAN POTASSIUM-HCTZ 50-12.5 MG PO TABS: 1.0000 | ORAL_TABLET | Freq: Every day | ORAL | 3 refills | Status: DC

## 2019-09-10 NOTE — Telephone Encounter (Signed)
Noted  

## 2019-09-10 NOTE — Progress Notes (Signed)
Subjective:   Patient ID: Ronald Harding, male    DOB: July 15, 1956, 63 y.o.   MRN: [id number]  HPI Here for medicare wellness and physical, no new complaints. Please see A/P for status and treatment of chronic medical problems.   Diet: heart healthy Physical activity: sedentary Depression/mood screen: negative Hearing: intact to whispered voice Visual acuity: grossly normal, performs annual eye exam  ADLs: capable Fall risk: none Home safety: good Cognitive evaluation: intact to orientation, naming, recall and repetition EOL planning: adv directives discussed    Office Visit from 10/18/2018 in [place]  PHQ-2 Total Score  0      I have personally reviewed and have noted 1. The patient's medical and social history - reviewed today no changes 2. Their use of alcohol, tobacco or illicit drugs 3. Their current medications and supplements 4. The patient's functional ability including ADL's, fall risks, home safety risks and hearing or visual impairment. 5. Diet and physical activities 6. Evidence for depression or mood disorders 7. Care team reviewed and updated  Patient Care Team: Hoyt Koch, MD as PCP - General (Internal Medicine) Past Medical History:  Diagnosis Date  . Aortic aneurysm ([place]) 2019   4.6 cm ascending thoracic aortic aneurysm  . Asthma    hx of-as a child  . History of colon polyps    benign  . Hypertension    takes Hyzaar daily  . Joint pain   . Pneumonia    hx of-in the 70's  . SBO (small bowel obstruction) ([place])    with NG tube placement   Past Surgical History:  Procedure Laterality Date  . ANKLE SURGERY Right 2012  . COLONOSCOPY    . MULTIPLE EXTRACTIONS WITH ALVEOLOPLASTY N/A 09/23/2015   Procedure: Extraction of tooth #'s 1-12,14- 28, 30-32 with alveoloplasty, bilateral mandibular tori reductions and reduction of lateral exostoses.;  Surgeon: Lenn Cal, DDS;  Location: [facility];  Service: Oral  Surgery;  Laterality: N/A;  . NOSE SURGERY  2013   Family History  Problem Relation Age of Onset  . Hypertension Mother   . Arthritis Mother   . Hypertension Father   . Heart disease Father   . Arthritis Father    Review of Systems  Constitutional: Negative.   HENT: Negative.   Eyes: Negative.   Respiratory: Negative for cough, chest tightness and shortness of breath.   Cardiovascular: Negative for chest pain, palpitations and leg swelling.  Gastrointestinal: Negative for abdominal distention, abdominal pain, constipation, diarrhea, nausea and vomiting.  Musculoskeletal: Negative.   Skin: Negative.   Neurological: Negative.   Psychiatric/Behavioral: Negative.     Objective:  Physical Exam Constitutional:      Appearance: He is well-developed. He is obese.  HENT:     Head: Normocephalic and atraumatic.  Neck:     Musculoskeletal: Normal range of motion.  Cardiovascular:     Rate and Rhythm: Normal rate and regular rhythm.  Pulmonary:     Effort: Pulmonary effort is normal. No respiratory distress.     Breath sounds: Normal breath sounds. No wheezing or rales.  Abdominal:     General: Bowel sounds are normal. There is no distension.     Palpations: Abdomen is soft.     Tenderness: There is no abdominal tenderness. There is no rebound.  Skin:    General: Skin is warm and dry.  Neurological:     Mental Status: He is alert and oriented to person, place, and time.  Coordination: Coordination normal.     Vitals:   09/10/19 0856 09/10/19 0924  BP: (!) 150/82 130/84  Pulse: 79   Temp: 98.2 F (36.8 C)   TempSrc: Oral   SpO2: 98%   Weight: (!) 340 lb (154.2 kg)   Height: 5\' 11"  (1.803 m)     Assessment & Plan:

## 2019-09-10 NOTE — Patient Instructions (Signed)

## 2019-09-10 NOTE — Telephone Encounter (Signed)
Med in question was patient's cholesterol medication----he has already made appt with dr crawford today

## 2019-09-11 ENCOUNTER — Encounter: Payer: Self-pay | Admitting: Internal Medicine

## 2019-09-11 NOTE — Assessment & Plan Note (Signed)
BP at goal on losartan/hctz 50/12.5 mg daily. Checking CMP and adjust as needed. 

## 2019-09-11 NOTE — Assessment & Plan Note (Signed)
Flu shot up to date. Shingrix counseled. Tetanus due 2022. Colonoscopy due 2024. Counseled about sun safety and mole surveillance. Counseled about the dangers of distracted driving. Given 10 year screening recommendations.

## 2019-09-11 NOTE — Assessment & Plan Note (Signed)
Weight stable. Counseled about weight loss.

## 2019-09-11 NOTE — Assessment & Plan Note (Signed)
Stable and seeing CT surgery for monitoring.

## 2020-05-29 DIAGNOSIS — Z20828 Contact with and (suspected) exposure to other viral communicable diseases: Secondary | ICD-10-CM | POA: Diagnosis not present

## 2020-07-12 ENCOUNTER — Other Ambulatory Visit: Payer: Self-pay | Admitting: Cardiothoracic Surgery

## 2020-07-12 DIAGNOSIS — I712 Thoracic aortic aneurysm, without rupture, unspecified: Secondary | ICD-10-CM

## 2020-08-18 ENCOUNTER — Encounter: Payer: Self-pay | Admitting: Cardiothoracic Surgery

## 2020-08-18 ENCOUNTER — Ambulatory Visit: Payer: Medicare HMO | Admitting: Cardiothoracic Surgery

## 2020-08-18 ENCOUNTER — Other Ambulatory Visit: Payer: Self-pay

## 2020-08-18 ENCOUNTER — Ambulatory Visit
Admission: RE | Admit: 2020-08-18 | Discharge: 2020-08-18 | Disposition: A | Discharge status: Home / Self Care | Payer: Medicare HMO | Source: Ambulatory Visit | Attending: Cardiothoracic Surgery | Admitting: Cardiothoracic Surgery

## 2020-08-18 VITALS — BP 116/71 | HR 89 | Temp 97.7°F | Resp 20 | Ht 72.0 in | Wt 331.0 lb

## 2020-08-18 DIAGNOSIS — I712 Thoracic aortic aneurysm, without rupture, unspecified: Secondary | ICD-10-CM

## 2020-08-18 DIAGNOSIS — M314 Aortic arch syndrome [Takayasu]: Secondary | ICD-10-CM | POA: Insufficient documentation

## 2020-08-18 DIAGNOSIS — I7121 Aneurysm of the ascending aorta, without rupture: Secondary | ICD-10-CM

## 2020-08-18 MED ORDER — IOPAMIDOL (ISOVUE-370) INJECTION 76%
75.0000 mL | Freq: Once | INTRAVENOUS | Status: AC | PRN
  Administered 2020-08-18: 75 mL via INTRAVENOUS

## 2020-08-18 MED ORDER — CEPHALEXIN 500 MG PO CAPS: 500.0000 mg | ORAL_CAPSULE | Freq: Three times a day (TID) | ORAL | 0 refills | Status: DC

## 2020-08-18 NOTE — Progress Notes (Signed)
PCP is Myrlene Broker, MD Referring Provider is Quentin Mulling, MD  Chief Complaint  Patient presents with  . Thoracic Aortic Aneurysm    yearly follow-up with CTA    HPI: Patient returns for 1 year exam and follow-up with CTA of thoracic aorta.  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  He remains asymptomatic.  The aortic diameter remains at 4.6 cm without evidence of mural thickening or ulceration.  He had an echocardiogram performed in 2020 which showed a normal functioning tricuspid aortic valve.  Normal LV function.  Today's CT images personally reviewed.  The ascending aortic fusiform aneurysm remains at 4.6 cm.  Lungs are clear.  No pathologic mediastinal adenopathy noted.  Past Medical History:  Diagnosis Date  . 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 Asthma    hx of-as a child  . History of colon polyps    benign  . Hypertension    takes Hyzaar daily  . Joint pain   . Pneumonia    hx of-in the 70's  . SBO (small bowel obstruction) (HCC)    with NG tube placement    Past Surgical History:  Procedure Laterality Date  . ANKLE SURGERY Right 2012  . COLONOSCOPY    . MULTIPLE EXTRACTIONS WITH ALVEOLOPLASTY N/A 09/23/2015   Procedure: Extraction of tooth #'s 1-12,14- 28, 30-32 with alveoloplasty, bilateral mandibular tori reductions and reduction of lateral exostoses.;  Surgeon: Charlynne Pander, DDS;  Location: [facility] OR;  Service: Oral Surgery;  Laterality: N/A;  . NOSE SURGERY  2013    Family History  Problem Relation Age of Onset  . Hypertension Mother   . Arthritis Mother   . Hypertension Father   . Heart disease Father   . Arthritis Father     Social History Social History   Tobacco Use  . Smoking status: Former Games developer  . Smokeless tobacco: Current User    Types: Chew  . Tobacco comment: one pack of chew per week  Vaping  Use  . Vaping Use: Never used  Substance Use Topics  . Alcohol use: No    Alcohol/week: 0.0 standard drinks  . Drug use: No    Current Outpatient Medications  Medication Sig Dispense Refill  . albuterol (PROVENTIL HFA;VENTOLIN HFA) 108 (90 Base) MCG/ACT inhaler Inhale 2 puffs into the lungs every 6 (six) hours as needed for wheezing or shortness of breath. 1 Inhaler 2  . aspirin 81 MG tablet Take 81 mg by mouth daily.    Marland Kitchen losartan-hydrochlorothiazide (HYZAAR) 50-12.5 MG tablet Take 1 tablet by mouth daily. 90 tablet 3  . Multiple Vitamins-Minerals (AIRBORNE GUMMIES PO) Take 2 tablets by mouth daily.    . cephALEXin (KEFLEX) 500 MG capsule Take 1 capsule (500 mg total) by mouth 3 (three) times daily. 21 capsule 0   No current facility-administered medications for this visit.    Allergies  Allergen Reactions  . Betadine [Povidone Iodine] Hives    Pt states in 2012 in [place] he fell and broke several bones. Betadine broke him out.     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 Is remained stable at 330 despite efforts to lose weight Doubly vaccinated for COVID-19 No shortness of breath or chest pain No palpitations Stable old injury of right leg Patient had IV contrast  infiltration into the left antecubital fossa at the time of CTA injection today BP 116/71 (BP Location: Right Arm, Patient Position: Sitting, Cuff Size: Large)   Pulse 89   Temp 97.7 F (36.5 C) (Skin)   Resp 20   Ht 6' (1.829 m)   Wt (!) 331 lb (150.1 kg)   SpO2 97% Comment: RA  BMI 44.89 kg/m  Physical Exam      Exam    General- alert and comfortable    Neck- no JVD, no cervical adenopathy palpable, no carotid bruit   Lungs- clear without rales, wheezes   Cor- regular rate and rhythm, no murmur , gallop   Abdomen- soft, non-tender   Extremities - warm, non-tender, minimal edema   Neuro- oriented, appropriate, no focal weakness   Diagnostic  Tests: Images personally reviewed showing stable 4.6 cm fusiform ascending aortic aneurysm  Impression: Patient has well-controlled blood pressure and he is compliant with his Hyzaar.  His risk for aortic dissection at the current aortic dimension is very minimal.  He understands to avoid heavy lifting and to avoid Levaquin antibiotic which can weaken the aortic wall.  The patient had extravasation of IV contrast into his left antecubital fossa and he will be treated with a course of oral Keflex 500 mg p.o. 3 times daily  Plan: We will continue regular CTA surveillance scans but increase the interval to 18 months since he has been stable for 5 years.  He will return in 18 months with CTA to be seen by one of my associates as I will retire within the next 3 months.  We discussed the importance of blood pressure control and compliance with blood pressure medication.   Mikey Bussing, MD [facility] [phone]

## 2020-09-10 ENCOUNTER — Encounter: Payer: Medicare HMO | Admitting: Internal Medicine

## 2020-09-10 ENCOUNTER — Ambulatory Visit (INDEPENDENT_AMBULATORY_CARE_PROVIDER_SITE_OTHER): Payer: Medicare HMO | Admitting: Internal Medicine

## 2020-09-10 ENCOUNTER — Other Ambulatory Visit: Payer: Self-pay

## 2020-09-10 ENCOUNTER — Encounter: Payer: Self-pay | Admitting: Internal Medicine

## 2020-09-10 VITALS — BP 128/76 | HR 80 | Temp 98.2°F | Ht 72.0 in | Wt 334.0 lb

## 2020-09-10 DIAGNOSIS — N400 Enlarged prostate without lower urinary tract symptoms: Secondary | ICD-10-CM

## 2020-09-10 DIAGNOSIS — I700 Atherosclerosis of aorta: Secondary | ICD-10-CM

## 2020-09-10 DIAGNOSIS — R7303 Prediabetes: Secondary | ICD-10-CM

## 2020-09-10 DIAGNOSIS — I712 Thoracic aortic aneurysm, without rupture: Secondary | ICD-10-CM

## 2020-09-10 DIAGNOSIS — I10 Essential (primary) hypertension: Secondary | ICD-10-CM

## 2020-09-10 DIAGNOSIS — Z0000 Encounter for general adult medical examination without abnormal findings: Secondary | ICD-10-CM | POA: Diagnosis not present

## 2020-09-10 DIAGNOSIS — I7121 Aneurysm of the ascending aorta, without rupture: Secondary | ICD-10-CM

## 2020-09-10 DIAGNOSIS — Z23 Encounter for immunization: Secondary | ICD-10-CM

## 2020-09-10 LAB — CBC
HCT: 42.8 % (ref 39.0–52.0)
Hemoglobin: 14.2 g/dL (ref 13.0–17.0)
MCHC: 33.1 g/dL (ref 30.0–36.0)
MCV: 88.9 fl (ref 78.0–100.0)
Platelets: 380 10*3/uL (ref 150.0–400.0)
RBC: 4.82 Mil/uL (ref 4.22–5.81)
RDW: 13.8 % (ref 11.5–15.5)
WBC: 7.5 10*3/uL (ref 4.0–10.5)

## 2020-09-10 LAB — LIPID PANEL
Cholesterol: 111 mg/dL (ref 0–200)
HDL: 31.2 mg/dL — ABNORMAL LOW
LDL Cholesterol: 54 mg/dL (ref 0–99)
NonHDL: 80.02
Total CHOL/HDL Ratio: 4
Triglycerides: 132 mg/dL (ref 0.0–149.0)
VLDL: 26.4 mg/dL (ref 0.0–40.0)

## 2020-09-10 LAB — COMPREHENSIVE METABOLIC PANEL WITH GFR
ALT: 13 U/L (ref 0–53)
AST: 16 U/L (ref 0–37)
Albumin: 4.1 g/dL (ref 3.5–5.2)
Alkaline Phosphatase: 108 U/L (ref 39–117)
BUN: 12 mg/dL (ref 6–23)
CO2: 29 meq/L (ref 19–32)
Calcium: 9.3 mg/dL (ref 8.4–10.5)
Chloride: 103 meq/L (ref 96–112)
Creatinine, Ser: 0.88 mg/dL (ref 0.40–1.50)
GFR: 91.07 mL/min
Glucose, Bld: 97 mg/dL (ref 70–99)
Potassium: 3.6 meq/L (ref 3.5–5.1)
Sodium: 139 meq/L (ref 135–145)
Total Bilirubin: 0.4 mg/dL (ref 0.2–1.2)
Total Protein: 7.5 g/dL (ref 6.0–8.3)

## 2020-09-10 LAB — PSA: PSA: 1.38 ng/mL (ref 0.10–4.00)

## 2020-09-10 LAB — HEMOGLOBIN A1C: Hgb A1c MFr Bld: 6.1 % (ref 4.6–6.5)

## 2020-09-10 NOTE — Assessment & Plan Note (Signed)
Checking HgA1c and adjust as needed.  

## 2020-09-10 NOTE — Assessment & Plan Note (Addendum)
Flu shot given. Covid-19 up to date. Pneumonia complete. Shingrix counseled. Tetanus 2022. Colonoscopy due 2024. Counseled about sun safety and mole surveillance. Counseled about the dangers of distracted driving. Given 10 year screening recommendations.

## 2020-09-10 NOTE — Assessment & Plan Note (Signed)
BP at goal on losartan/hctz. Checking CMP and adjust as needed.  

## 2020-09-10 NOTE — Progress Notes (Signed)
Subjective:   Patient ID: Ronald Harding, male    DOB: [birth year], 64 y.o.   MRN: [id number]  HPI Here for medicare wellness and physical, no new complaints. Please see A/P for status and treatment of chronic medical problems.   Diet: heart healthy Physical activity: sedentary Depression/mood screen: negative Hearing: intact to whispered voice Visual acuity: grossly normal with lens, performs annual eye exam  ADLs: capable Fall risk: none Home safety: good Cognitive evaluation: intact to orientation, naming, recall and repetition EOL planning: adv directives discussed    Office Visit from 09/10/2020 in [facility] at [facility] Total Score 0      I have personally reviewed and have noted 1. The patient's medical and social history - reviewed today no changes 2. Their use of alcohol, tobacco or illicit drugs 3. Their current medications and supplements 4. The patient's functional ability including ADL's, fall risks, home safety risks and hearing or visual impairment. 5. Diet and physical activities 6. Evidence for depression or mood disorders 7. Care team reviewed and updated  Patient Care Team: Myrlene Broker, MD as PCP - General (Internal Medicine) Past Medical History:  Diagnosis Date  . 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 Asthma    hx of-as a child  . History of colon polyps    benign  . Hypertension    takes Hyzaar daily  . Joint pain   . Pneumonia    hx of-in the 70's  . SBO (small bowel obstruction) (HCC)    with NG tube placement   Past Surgical History:  Procedure Laterality Date  . ANKLE SURGERY Right 2012  . COLONOSCOPY    . MULTIPLE EXTRACTIONS WITH ALVEOLOPLASTY N/A 09/23/2015   Procedure: Extraction of tooth #'s 1-12,14- 28, 30-32 with alveoloplasty, bilateral mandibular tori reductions and reduction of lateral exostoses.;  Surgeon: Charlynne Pander, DDS;  Location: [facility] OR;  Service: Oral  Surgery;  Laterality: N/A;  . NOSE SURGERY  2013   Family History  Problem Relation Age of Onset  . Hypertension Mother   . Arthritis Mother   . Hypertension Father   . Heart disease Father   . Arthritis Father     Review of Systems  Constitutional: Negative.   HENT: Negative.   Eyes: Negative.   Respiratory: Negative for cough, chest tightness and shortness of breath.   Cardiovascular: Negative for chest pain, palpitations and leg swelling.  Gastrointestinal: Negative for abdominal distention, abdominal pain, constipation, diarrhea, nausea and vomiting.  Musculoskeletal: Negative.   Skin: Negative.   Neurological: Negative.   Psychiatric/Behavioral: Negative.     Objective:  Physical Exam Constitutional:      Appearance: He is well-developed. He is obese.  HENT:     Head: Normocephalic and atraumatic.  Cardiovascular:     Rate and Rhythm: Normal rate and regular rhythm.  Pulmonary:     Effort: Pulmonary effort is normal. No respiratory distress.     Breath sounds: Normal breath sounds. No wheezing or rales.  Abdominal:     General: Bowel sounds are normal. There is no distension.     Palpations: Abdomen is soft.     Tenderness: There is no abdominal tenderness. There is no rebound.  Musculoskeletal:     Cervical back: Normal range of motion.  Skin:    General: Skin is warm and dry.  Neurological:     Mental Status: He is alert and oriented to person,  place, and time.     Coordination: Coordination normal.     Vitals:   09/10/20 1044  BP: 128/76  Pulse: 80  Temp: 98.2 F (36.8 C)  TempSrc: Oral  SpO2: 98%  Weight: (!) 334 lb (151.5 kg)  Height: 6' (1.829 m)     This visit occurred during the SARS-CoV-2 public health emergency.  Safety protocols were in place, including screening questions prior to the visit, additional usage of staff PPE, and extensive cleaning of exam room while observing appropriate contact time as indicated for disinfecting solutions.    Assessment & Plan:  Flu shot given at visit

## 2020-09-10 NOTE — Assessment & Plan Note (Signed)
Weight down about 10 pounds since last year and he is encouraged to continue with diet and exercise to help.

## 2020-09-10 NOTE — Assessment & Plan Note (Signed)
Noted as slight on imaging. Checking lipid panel and he would like to await before changes.

## 2020-09-10 NOTE — Patient Instructions (Signed)

## 2020-09-10 NOTE — Assessment & Plan Note (Signed)
Stable due for follow up 18 months with vascular surgery.

## 2020-11-02 ENCOUNTER — Other Ambulatory Visit: Payer: Self-pay | Admitting: Internal Medicine

## 2020-11-02 DIAGNOSIS — I712 Thoracic aortic aneurysm, without rupture: Secondary | ICD-10-CM

## 2020-11-02 DIAGNOSIS — I7121 Aneurysm of the ascending aorta, without rupture: Secondary | ICD-10-CM

## 2021-01-24 ENCOUNTER — Telehealth: Payer: Self-pay | Admitting: Gastroenterology

## 2021-01-24 NOTE — Telephone Encounter (Signed)
Spoke with pt's spouse, she states the pt had a colonoscopy done in 2014, caller will attempt to fax over the report to review, requesting Dr Christella Hartigan

## 2021-01-28 NOTE — Telephone Encounter (Signed)
Dr. Christella Hartigan,   We have received the colonoscopy report from patient. Patient is requesting you. I will send the records for you to review. Please advise.   Thank you.

## 2021-02-01 ENCOUNTER — Encounter: Payer: Self-pay | Admitting: Gastroenterology

## 2021-02-01 NOTE — Telephone Encounter (Signed)
Colonoscopy July 24, 2013, Dr. Charna Elizabeth, indications screening for colon cancer.  Findings 2 subcentimeter polyps were snare resected.  3 subcentimeter polyps were biopsied.  Hemorrhoids were noted.  Pathology confirmed 1 of subcentimeter polyps that was snare resected was a tubular adenoma.  He is "due" for surveillance colonoscopy around now.  Please arrange for [facility]

## 2021-02-01 NOTE — Telephone Encounter (Signed)
Patient is scheduled 6/17 Pre visit. 7/1 Colonoscopy.  Thank you.

## 2021-02-07 ENCOUNTER — Encounter: Payer: Self-pay | Admitting: Internal Medicine

## 2021-02-07 ENCOUNTER — Ambulatory Visit (INDEPENDENT_AMBULATORY_CARE_PROVIDER_SITE_OTHER): Payer: Medicare HMO

## 2021-02-07 ENCOUNTER — Other Ambulatory Visit: Payer: Self-pay

## 2021-02-07 ENCOUNTER — Ambulatory Visit (INDEPENDENT_AMBULATORY_CARE_PROVIDER_SITE_OTHER): Payer: Medicare HMO | Admitting: Internal Medicine

## 2021-02-07 VITALS — BP 132/80 | HR 74 | Temp 98.2°F | Resp 18 | Ht 72.0 in | Wt 344.6 lb

## 2021-02-07 DIAGNOSIS — M25561 Pain in right knee: Secondary | ICD-10-CM | POA: Diagnosis not present

## 2021-02-07 DIAGNOSIS — M25551 Pain in right hip: Secondary | ICD-10-CM | POA: Insufficient documentation

## 2021-02-07 DIAGNOSIS — M5441 Lumbago with sciatica, right side: Secondary | ICD-10-CM | POA: Diagnosis not present

## 2021-02-07 DIAGNOSIS — M545 Low back pain, unspecified: Secondary | ICD-10-CM | POA: Insufficient documentation

## 2021-02-07 MED ORDER — BACLOFEN 10 MG PO TABS: 10.0000 mg | ORAL_TABLET | Freq: Three times a day (TID) | ORAL | 0 refills | Status: DC

## 2021-02-07 MED ORDER — METHYLPREDNISOLONE ACETATE 40 MG/ML IJ SUSP
40.0000 mg | Freq: Once | INTRAMUSCULAR | Status: AC
  Administered 2021-02-07: 40 mg via INTRAMUSCULAR

## 2021-02-07 MED ORDER — KETOROLAC TROMETHAMINE 30 MG/ML IJ SOLN
30.0000 mg | Freq: Once | INTRAMUSCULAR | Status: AC
  Administered 2021-02-07: 30 mg via INTRAMUSCULAR

## 2021-02-07 NOTE — Assessment & Plan Note (Signed)
Checking x-ray right hip. Given depo-medrol 40 mg IM and toradol 30 mg IM. Rx baclofen to help with pain. Advised not to take expired medications.

## 2021-02-07 NOTE — Patient Instructions (Addendum)
We will check the x-ray of the hip today. We have given you a shot today to help with the inflammation.  We have sent in baclofen to use at night time for the pain so you can sleep better.

## 2021-02-07 NOTE — Progress Notes (Signed)
   Subjective:   Patient ID: Ronald Harding, male    DOB: Sep 22, 1956, 65 y.o.   MRN: [id number]  HPI The patient is a 65 YO man coming in for knee pain and hip pain and right buttock pain. Started about 1-2 months ago. Is taking ibuprofen and this was helping for awhile. Now this is helping but not much. Took a vicodin last night at bedtime he had left over from prior surgery which did take edge off but did not help well. Denies injury or overuse at onset. Pain is mostly in the right hip. Also right knee is hurting. Pain does sometimes feel like it starts in the right buttock. Prior injection in the low spine for left radiculopathy. He would like to know cause. Denies numbness or weakness in the legs. Denies change in bowels or bladder or loss of sensation. Weight is stable. Worse with activity. Denies pain with extended rest on starting to move.   Review of Systems  Constitutional: Positive for activity change.  HENT: Negative.   Eyes: Negative.   Respiratory: Negative for cough, chest tightness and shortness of breath.   Cardiovascular: Negative for chest pain, palpitations and leg swelling.  Gastrointestinal: Negative for abdominal distention, abdominal pain, constipation, diarrhea, nausea and vomiting.  Musculoskeletal: Positive for arthralgias, back pain and myalgias.  Skin: Negative.   Neurological: Negative.   Psychiatric/Behavioral: Negative.     Objective:  Physical Exam Constitutional:      Appearance: He is well-developed. He is obese.  HENT:     Head: Normocephalic and atraumatic.  Cardiovascular:     Rate and Rhythm: Normal rate and regular rhythm.  Pulmonary:     Effort: Pulmonary effort is normal. No respiratory distress.     Breath sounds: Normal breath sounds. No wheezing or rales.  Abdominal:     General: Bowel sounds are normal. There is no distension.     Palpations: Abdomen is soft.     Tenderness: There is no abdominal tenderness. There is no rebound.   Musculoskeletal:     Cervical back: Normal range of motion.     Comments: Pain right groin, pain right lateral thigh to palpation  Skin:    General: Skin is warm and dry.  Neurological:     Mental Status: He is alert and oriented to person, place, and time.     Coordination: Coordination normal.     Vitals:   02/07/21 0851  BP: 132/80  Pulse: 74  Resp: 18  Temp: 98.2 F (36.8 C)  TempSrc: Oral  SpO2: 99%  Weight: (!) 344 lb 9.6 oz (156.3 kg)  Height: 6' (1.829 m)    This visit occurred during the SARS-CoV-2 public health emergency.  Safety protocols were in place, including screening questions prior to the visit, additional usage of staff PPE, and extensive cleaning of exam room while observing appropriate contact time as indicated for disinfecting solutions.   Assessment & Plan:  Depo-medrol 40 mg IM and toradol 30 mg IM given at visit  Visit time 20 minutes in face to face communication with patient and coordination of care, additional 10 minutes spent in record review, coordination or care, ordering tests, communicating/referring to other healthcare professionals, documenting in medical records all on the same day of the visit for total time 30 minutes spent on the visit.

## 2021-02-07 NOTE — Assessment & Plan Note (Signed)
Suspect referred from hip. Checking x-ray hip. Rx baclofen to try for pain. Given depo-medrol 40 mg IM and toradol 30 mg IM.

## 2021-02-07 NOTE — Assessment & Plan Note (Signed)
Given depo-medrol 40 mg IM and toradol 30 mg IM at visit. Suspect referred from hip.

## 2021-02-15 ENCOUNTER — Telehealth: Payer: Self-pay | Admitting: Internal Medicine

## 2021-02-15 NOTE — Telephone Encounter (Signed)
Please call with lab results 

## 2021-02-15 NOTE — Telephone Encounter (Signed)
Patient did not have recent labs. He did however have imaging taken of his hip. Both him and his wife were made aware of the results on 02/08/2021.

## 2021-02-18 ENCOUNTER — Telehealth: Payer: Self-pay | Admitting: Internal Medicine

## 2021-02-18 NOTE — Telephone Encounter (Signed)
Patient was seen on 04.18.22 and got a shot for his hip pain and it helped for about a week. His pain is now back and he is wondering if he needs to go see a specialist or what needs to be done

## 2021-02-18 NOTE — Telephone Encounter (Signed)
See below

## 2021-02-21 NOTE — Telephone Encounter (Signed)
Can try tylenol for pain or we can have him see sports medicine.

## 2021-02-21 NOTE — Telephone Encounter (Signed)
Unable to get in contact with the patient due to the phone going straight to voicemail. LDVM with Dr. Frutoso Chase recommendations. Office number was provided in case he has additional questions or concerns.

## 2021-04-06 DIAGNOSIS — M19171 Post-traumatic osteoarthritis, right ankle and foot: Secondary | ICD-10-CM | POA: Diagnosis not present

## 2021-04-06 DIAGNOSIS — M25561 Pain in right knee: Secondary | ICD-10-CM | POA: Diagnosis not present

## 2021-04-06 DIAGNOSIS — M25551 Pain in right hip: Secondary | ICD-10-CM | POA: Diagnosis not present

## 2021-04-08 ENCOUNTER — Ambulatory Visit (AMBULATORY_SURGERY_CENTER): Payer: Medicare HMO

## 2021-04-08 VITALS — Ht 72.0 in | Wt 340.0 lb

## 2021-04-08 DIAGNOSIS — Z860100 Personal history of colon polyps, unspecified: Secondary | ICD-10-CM

## 2021-04-08 NOTE — Progress Notes (Signed)

## 2021-04-21 ENCOUNTER — Telehealth: Payer: Self-pay

## 2021-04-21 DIAGNOSIS — M25551 Pain in right hip: Secondary | ICD-10-CM | POA: Diagnosis not present

## 2021-04-21 NOTE — Telephone Encounter (Signed)
Pt wife calls to check with [place] bc pt had a steroid injection to the Right hip today, states his doctor did not think it would matter except for him to increase his liquids, Sarah Monday, RN, charge nurse, states it is ok.

## 2021-04-22 ENCOUNTER — Encounter: Payer: Self-pay | Admitting: Gastroenterology

## 2021-04-22 ENCOUNTER — Ambulatory Visit (AMBULATORY_SURGERY_CENTER): Payer: Medicare HMO | Admitting: Gastroenterology

## 2021-04-22 ENCOUNTER — Other Ambulatory Visit: Payer: Self-pay

## 2021-04-22 VITALS — BP 140/54 | HR 61 | Temp 98.6°F | Resp 19 | Ht 72.0 in | Wt 340.0 lb

## 2021-04-22 DIAGNOSIS — K5730 Diverticulosis of large intestine without perforation or abscess without bleeding: Secondary | ICD-10-CM | POA: Diagnosis not present

## 2021-04-22 DIAGNOSIS — Z860100 Personal history of colon polyps, unspecified: Secondary | ICD-10-CM

## 2021-04-22 DIAGNOSIS — I10 Essential (primary) hypertension: Secondary | ICD-10-CM | POA: Diagnosis not present

## 2021-04-22 MED ORDER — SODIUM CHLORIDE 0.9 % IV SOLN: 500.0000 mL | Freq: Once | INTRAVENOUS | Status: DC

## 2021-04-22 NOTE — Progress Notes (Signed)
Report to PACU, RN, vss, BBS= Clear.  

## 2021-04-22 NOTE — Op Note (Signed)
[facility] Patient Name: Ronald Harding Procedure Date: 04/22/2021 8:40 AM MRN: [id number] Endoscopist: Rachael Fee , MD Age: 65 Referring MD:  Date of Birth: Nov 18, 1955 Gender: Male Account #: [id number] Procedure:                Colonoscopy Indications:              High risk colon cancer surveillance: Personal                            history of colonic polyps: Colonoscopy 2014 Dr.                            Loreta Ave found 5 subCM polyps, one was TA, the rest                            were not pre-cancerous Medicines:                Monitored Anesthesia Care Procedure:                Pre-Anesthesia Assessment:                           - Prior to the procedure, a History and Physical                            was performed, and patient medications and                            allergies were reviewed. The patient's tolerance of                            previous anesthesia was also reviewed. The risks                            and benefits of the procedure and the sedation                            options and risks were discussed with the patient.                            All questions were answered, and informed consent                            was obtained. Prior Anticoagulants: The patient has                            taken no previous anticoagulant or antiplatelet                            agents. ASA Grade Assessment: III - A patient with                            severe systemic disease. After reviewing the risks  and benefits, the patient was deemed in                            satisfactory condition to undergo the procedure.                           After obtaining informed consent, the colonoscope                            was passed under direct vision. Throughout the                            procedure, the patient's blood pressure, pulse, and                            oxygen saturations were monitored continuously.  The                            CF [id number] #[id number] was introduced through the anus                            and advanced to the the cecum, identified by                            appendiceal orifice and ileocecal valve. The                            colonoscopy was performed without difficulty. The                            patient tolerated the procedure well. The quality                            of the bowel preparation was good. The ileocecal                            valve, appendiceal orifice, and rectum were                            photographed. Scope In: 8:52:08 AM Scope Out: 9:03:48 AM Scope Withdrawal Time: 0 hours 8 minutes 33 seconds  Total Procedure Duration: 0 hours 11 minutes 40 seconds  Findings:                 Multiple small-mouthed diverticula were found in                            the left colon.                           The exam was otherwise without abnormality on                            direct and retroflexion views. Complications:            No immediate complications.  Estimated blood loss:                            None. Estimated Blood Loss:     Estimated blood loss: none. Impression:               - Diverticulosis in the left colon.                           - The examination was otherwise normal on direct                            and retroflexion views.                           - No polyps or cancers. Recommendation:           - Patient has a contact number available for                            emergencies. The signs and symptoms of potential                            delayed complications were discussed with the                            patient. Return to normal activities tomorrow.                            Written discharge instructions were provided to the                            patient.                           - Resume previous diet.                           - Continue present medications.                           -  Repeat colonoscopy in 10 years for screening                            purposes. Rachael Fee, MD 04/22/2021 9:07:46 AM This report has been signed electronically.

## 2021-04-22 NOTE — Patient Instructions (Signed)
YOU HAD AN ENDOSCOPIC PROCEDURE TODAY AT THE [facility]:   Refer to the procedure report that was given to you for any specific questions about what was found during the examination.  If the procedure report does not answer your questions, please call your gastroenterologist to clarify.  If you requested that your care partner not be given the details of your procedure findings, then the procedure report has been included in a sealed envelope for you to review at your convenience later.  YOU SHOULD EXPECT: Some feelings of bloating in the abdomen. Passage of more gas than usual.  Walking can help get rid of the air that was put into your GI tract during the procedure and reduce the bloating. If you had a lower endoscopy (such as a colonoscopy or flexible sigmoidoscopy) you may notice spotting of blood in your stool or on the toilet paper. If you underwent a bowel prep for your procedure, you may not have a normal bowel movement for a few days.  Please Note:  You might notice some irritation and congestion in your nose or some drainage.  This is from the oxygen used during your procedure.  There is no need for concern and it should clear up in a day or so.  SYMPTOMS TO REPORT IMMEDIATELY:  Following lower endoscopy (colonoscopy or flexible sigmoidoscopy):  Excessive amounts of blood in the stool  Significant tenderness or worsening of abdominal pains  Swelling of the abdomen that is new, acute  Fever of 100F or higher   For urgent or emergent issues, a gastroenterologist can be reached at any hour by calling [phone]. Do not use MyChart messaging for urgent concerns.    DIET:  We do recommend a small meal at first, but then you may proceed to your regular diet.  Drink plenty of fluids but you should avoid alcoholic beverages for 24 hours.  ACTIVITY:  You should plan to take it easy for the rest of today and you should NOT DRIVE or use heavy machinery until tomorrow (because  of the sedation medicines used during the test).    FOLLOW UP: Our staff will call the number listed on your records 48-72 hours following your procedure to check on you and address any questions or concerns that you may have regarding the information given to you following your procedure. If we do not reach you, we will leave a message.  We will attempt to reach you two times.  During this call, we will ask if you have developed any symptoms of COVID 19. If you develop any symptoms (ie: fever, flu-like symptoms, shortness of breath, cough etc.) before then, please call [phone].  If you test positive for Covid 19 in the 2 weeks post procedure, please call and report this information to us.    If any biopsies were taken you will be contacted by phone or by letter within the next 1-3 weeks.  Please call us at [phone] if you have not heard about the biopsies in 3 weeks.    SIGNATURES/CONFIDENTIALITY: You and/or your care partner have signed paperwork which will be entered into your electronic medical record.  These signatures attest to the fact that that the information above on your After Visit Summary has been reviewed and is understood.  Full responsibility of the confidentiality of this discharge information lies with you and/or your care-partner.    Resume medications. Repeat procedure in 10 years. 

## 2021-04-27 ENCOUNTER — Telehealth: Payer: Self-pay

## 2021-04-27 NOTE — Telephone Encounter (Signed)
  Follow up Call-  Call back number 04/22/2021  Post procedure Call Back phone  # [phone]  Permission to leave phone message Yes  Some recent data might be hidden     Patient questions:  Do you have a fever, pain , or abdominal swelling? No. Pain Score  0 *  Have you tolerated food without any problems? Yes.    Have you been able to return to your normal activities? Yes.    Do you have any questions about your discharge instructions: Diet   No. Medications  No. Follow up visit  No.  Do you have questions or concerns about your Care? No.  Actions: * If pain score is 4 or above: No action needed, pain <4.  Have you developed a fever since your procedure? no  2.   Have you had an respiratory symptoms (SOB or cough) since your procedure? no  3.   Have you tested positive for COVID 19 since your procedure no  4.   Have you had any family members/close contacts diagnosed with the COVID 19 since your procedure?  no   If yes to any of these questions please route to Laverna Peace, RN and Karlton Lemon, RN

## 2021-05-04 ENCOUNTER — Other Ambulatory Visit: Payer: Self-pay | Admitting: Orthopedic Surgery

## 2021-05-04 DIAGNOSIS — M25551 Pain in right hip: Secondary | ICD-10-CM | POA: Diagnosis not present

## 2021-05-04 DIAGNOSIS — M5431 Sciatica, right side: Secondary | ICD-10-CM | POA: Diagnosis not present

## 2021-05-04 DIAGNOSIS — M545 Low back pain, unspecified: Secondary | ICD-10-CM

## 2021-05-14 ENCOUNTER — Ambulatory Visit
Admission: RE | Admit: 2021-05-14 | Discharge: 2021-05-14 | Disposition: A | Discharge status: Home / Self Care | Payer: Medicare HMO | Source: Ambulatory Visit | Attending: Orthopedic Surgery | Admitting: Orthopedic Surgery

## 2021-05-14 ENCOUNTER — Other Ambulatory Visit: Payer: Self-pay

## 2021-05-14 DIAGNOSIS — M25551 Pain in right hip: Secondary | ICD-10-CM

## 2021-05-14 DIAGNOSIS — M48061 Spinal stenosis, lumbar region without neurogenic claudication: Secondary | ICD-10-CM | POA: Diagnosis not present

## 2021-05-14 DIAGNOSIS — M545 Low back pain, unspecified: Secondary | ICD-10-CM | POA: Diagnosis not present

## 2021-05-16 DIAGNOSIS — M1611 Unilateral primary osteoarthritis, right hip: Secondary | ICD-10-CM | POA: Diagnosis not present

## 2021-06-28 DIAGNOSIS — M1611 Unilateral primary osteoarthritis, right hip: Secondary | ICD-10-CM | POA: Diagnosis not present

## 2021-07-20 ENCOUNTER — Other Ambulatory Visit: Payer: Self-pay | Admitting: Internal Medicine

## 2021-07-20 DIAGNOSIS — I7121 Aneurysm of the ascending aorta, without rupture: Secondary | ICD-10-CM

## 2021-07-20 DIAGNOSIS — I712 Thoracic aortic aneurysm, without rupture: Secondary | ICD-10-CM

## 2021-09-09 ENCOUNTER — Ambulatory Visit: Payer: Medicare HMO | Admitting: Internal Medicine

## 2021-09-09 ENCOUNTER — Ambulatory Visit (INDEPENDENT_AMBULATORY_CARE_PROVIDER_SITE_OTHER): Payer: Medicare HMO

## 2021-09-09 ENCOUNTER — Other Ambulatory Visit: Payer: Self-pay

## 2021-09-09 ENCOUNTER — Encounter: Payer: Self-pay | Admitting: Internal Medicine

## 2021-09-09 DIAGNOSIS — Z23 Encounter for immunization: Secondary | ICD-10-CM

## 2021-09-23 DIAGNOSIS — T8484XA Pain due to internal orthopedic prosthetic devices, implants and grafts, initial encounter: Secondary | ICD-10-CM | POA: Diagnosis not present

## 2021-09-27 DIAGNOSIS — M1611 Unilateral primary osteoarthritis, right hip: Secondary | ICD-10-CM | POA: Diagnosis not present

## 2021-09-28 ENCOUNTER — Other Ambulatory Visit: Payer: Self-pay

## 2021-09-28 ENCOUNTER — Ambulatory Visit (INDEPENDENT_AMBULATORY_CARE_PROVIDER_SITE_OTHER): Payer: Medicare HMO | Admitting: Internal Medicine

## 2021-09-28 ENCOUNTER — Encounter: Payer: Self-pay | Admitting: Internal Medicine

## 2021-09-28 VITALS — BP 126/80 | HR 74 | Resp 18 | Ht 72.0 in | Wt 332.2 lb

## 2021-09-28 DIAGNOSIS — R7303 Prediabetes: Secondary | ICD-10-CM | POA: Diagnosis not present

## 2021-09-28 DIAGNOSIS — I10 Essential (primary) hypertension: Secondary | ICD-10-CM

## 2021-09-28 DIAGNOSIS — R350 Frequency of micturition: Secondary | ICD-10-CM | POA: Diagnosis not present

## 2021-09-28 DIAGNOSIS — Z0000 Encounter for general adult medical examination without abnormal findings: Secondary | ICD-10-CM

## 2021-09-28 DIAGNOSIS — I700 Atherosclerosis of aorta: Secondary | ICD-10-CM | POA: Diagnosis not present

## 2021-09-28 LAB — LIPID PANEL
Cholesterol: 127 mg/dL (ref 0–200)
HDL: 39.2 mg/dL
LDL Cholesterol: 77 mg/dL (ref 0–99)
NonHDL: 88.29
Total CHOL/HDL Ratio: 3
Triglycerides: 57 mg/dL (ref 0.0–149.0)
VLDL: 11.4 mg/dL (ref 0.0–40.0)

## 2021-09-28 LAB — COMPREHENSIVE METABOLIC PANEL WITH GFR
ALT: 16 U/L (ref 0–53)
AST: 16 U/L (ref 0–37)
Albumin: 4.1 g/dL (ref 3.5–5.2)
Alkaline Phosphatase: 107 U/L (ref 39–117)
BUN: 21 mg/dL (ref 6–23)
CO2: 25 meq/L (ref 19–32)
Calcium: 9.7 mg/dL (ref 8.4–10.5)
Chloride: 103 meq/L (ref 96–112)
Creatinine, Ser: 0.87 mg/dL (ref 0.40–1.50)
GFR: 90.71 mL/min
Glucose, Bld: 99 mg/dL (ref 70–99)
Potassium: 4.1 meq/L (ref 3.5–5.1)
Sodium: 136 meq/L (ref 135–145)
Total Bilirubin: 0.5 mg/dL (ref 0.2–1.2)
Total Protein: 7.6 g/dL (ref 6.0–8.3)

## 2021-09-28 LAB — CBC
HCT: 42.2 % (ref 39.0–52.0)
Hemoglobin: 14 g/dL (ref 13.0–17.0)
MCHC: 33.2 g/dL (ref 30.0–36.0)
MCV: 88.2 fl (ref 78.0–100.0)
Platelets: 404 10*3/uL — ABNORMAL HIGH (ref 150.0–400.0)
RBC: 4.78 Mil/uL (ref 4.22–5.81)
RDW: 13.4 % (ref 11.5–15.5)
WBC: 13.5 10*3/uL — ABNORMAL HIGH (ref 4.0–10.5)

## 2021-09-28 LAB — HEMOGLOBIN A1C: Hgb A1c MFr Bld: 6.1 % (ref 4.6–6.5)

## 2021-09-28 LAB — PSA: PSA: 1.5 ng/mL (ref 0.10–4.00)

## 2021-09-28 NOTE — Assessment & Plan Note (Signed)
Checking HgA1c and adjust as needed.  

## 2021-09-28 NOTE — Progress Notes (Signed)
Subjective:   Patient ID: Ronald Harding, male    DOB: 20-Sep-1956, 65 y.o.   MRN: [id number]  HPI Here for medicare wellness and physical, no new complaints. Please see A/P for status and treatment of chronic medical problems.   Diet: heart healthy Physical activity: sedentary Depression/mood screen: negative Hearing: intact to whispered voice Visual acuity: grossly normal, performs annual eye exam  ADLs: capable Fall risk: none Home safety: good Cognitive evaluation: intact to orientation, naming, recall and repetition EOL planning: adv directives discussed  Flowsheet Row Office Visit from 09/28/2021 in [facility] Total Score 0        Fall Risk 01/19/2019 01/19/2019 01/20/2019 01/21/2019 09/09/2021  Falls in the past year? - - - - 0  Was there an injury with Fall? - - - - 0  Fall Risk Category Calculator - - - - 0  Fall Risk Category - - - - Low  Patient Fall Risk Level Moderate fall risk Low fall risk Low fall risk Low fall risk Low fall risk    I have personally reviewed and have noted 1. The patient's medical and social history - reviewed today no changes 2. Their use of alcohol, tobacco or illicit drugs 3. Their current medications and supplements 4. The patient's functional ability including ADL's, fall risks, home safety risks and hearing or visual impairment. 5. Diet and physical activities 6. Evidence for depression or mood disorders 7. Care team reviewed and updated 8.  The patient is not on an opioid pain medication.  Patient Care Team: Myrlene Broker, MD as PCP - General (Internal Medicine) Past Medical History:  Diagnosis Date   Aortic aneurysm (HCC) 2019   4.6 cm ascending thoracic aortic aneurysm   Asthma    hx of-as a child   History of colon polyps    benign   Hypertension    takes Hyzaar daily   Joint pain    Pneumonia    hx of-in the 70's   SBO (small bowel obstruction) (HCC)    with NG tube placement    Past Surgical History:  Procedure Laterality Date   ANKLE SURGERY Right 10/23/2010   COLONOSCOPY     MULTIPLE EXTRACTIONS WITH ALVEOLOPLASTY N/A 09/23/2015   Procedure: Extraction of tooth #'s 1-12,14- 28, 30-32 with alveoloplasty, bilateral mandibular tori reductions and reduction of lateral exostoses.;  Surgeon: Charlynne Pander, DDS;  Location: [facility] OR;  Service: Oral Surgery;  Laterality: N/A;   NOSE SURGERY  10/24/2011   POLYPECTOMY     Family History  Problem Relation Age of Onset   Hypertension Mother    Arthritis Mother    Hypertension Father    Heart disease Father    Arthritis Father    Rectal cancer Other    Stomach cancer Other    Esophageal cancer Other    Colon cancer Other    Colon polyps Other     Review of Systems  Constitutional: Negative.   HENT: Negative.    Eyes: Negative.   Respiratory:  Negative for cough, chest tightness and shortness of breath.   Cardiovascular:  Negative for chest pain, palpitations and leg swelling.  Gastrointestinal:  Negative for abdominal distention, abdominal pain, constipation, diarrhea, nausea and vomiting.  Musculoskeletal:  Positive for arthralgias.  Skin: Negative.   Neurological: Negative.   Psychiatric/Behavioral: Negative.     Objective:  Physical Exam Constitutional:      Appearance: He is well-developed. He is obese.  HENT:  Head: Normocephalic and atraumatic.  Cardiovascular:     Rate and Rhythm: Normal rate and regular rhythm.  Pulmonary:     Effort: Pulmonary effort is normal. No respiratory distress.     Breath sounds: Normal breath sounds. No wheezing or rales.  Abdominal:     General: Bowel sounds are normal. There is no distension.     Palpations: Abdomen is soft.     Tenderness: There is no abdominal tenderness. There is no rebound.  Musculoskeletal:     Cervical back: Normal range of motion.  Skin:    General: Skin is warm and dry.  Neurological:     Mental Status: He is alert and oriented  to person, place, and time.     Coordination: Coordination normal.    Vitals:   09/28/21 0916  BP: 126/80  Pulse: 74  Resp: 18  SpO2: 96%  Weight: (!) 332 lb 3.2 oz (150.7 kg)  Height: 6' (1.829 m)    This visit occurred during the SARS-CoV-2 public health emergency.  Safety protocols were in place, including screening questions prior to the visit, additional usage of staff PPE, and extensive cleaning of exam room while observing appropriate contact time as indicated for disinfecting solutions.   Assessment & Plan:

## 2021-09-28 NOTE — Assessment & Plan Note (Signed)
Flu shot up to date. Covid-19 up to date. Pneumonia declines today. Shingrix advised to get at pharmacy. Tetanus advised to get at pharmacy. Colonoscopy due 2032. Counseled about sun safety and mole surveillance. Counseled about the dangers of distracted driving. Given 10 year screening recommendations.

## 2021-09-28 NOTE — Assessment & Plan Note (Signed)
BP at goal on losartan/hctz 50/12.5 mg daily. Checking CMP and adjust as needed. 

## 2021-09-28 NOTE — Assessment & Plan Note (Signed)
Weight stable and reminded about diet and exercise to help.

## 2021-09-28 NOTE — Assessment & Plan Note (Signed)
Taking aspirin 81 mg daily and not on statin. He has not initiated statin in the past.

## 2021-10-19 DIAGNOSIS — T8484XD Pain due to internal orthopedic prosthetic devices, implants and grafts, subsequent encounter: Secondary | ICD-10-CM | POA: Diagnosis not present

## 2021-10-25 ENCOUNTER — Other Ambulatory Visit: Payer: Self-pay | Admitting: Internal Medicine

## 2021-10-25 DIAGNOSIS — I7121 Aneurysm of the ascending aorta, without rupture: Secondary | ICD-10-CM

## 2021-11-02 DIAGNOSIS — T8484XD Pain due to internal orthopedic prosthetic devices, implants and grafts, subsequent encounter: Secondary | ICD-10-CM | POA: Diagnosis not present

## 2021-11-21 ENCOUNTER — Other Ambulatory Visit: Payer: Self-pay

## 2021-11-21 ENCOUNTER — Ambulatory Visit (INDEPENDENT_AMBULATORY_CARE_PROVIDER_SITE_OTHER): Payer: Medicare HMO | Admitting: Ophthalmology

## 2021-11-21 ENCOUNTER — Encounter (INDEPENDENT_AMBULATORY_CARE_PROVIDER_SITE_OTHER): Payer: Self-pay | Admitting: Ophthalmology

## 2021-11-21 DIAGNOSIS — H2513 Age-related nuclear cataract, bilateral: Secondary | ICD-10-CM

## 2021-11-21 DIAGNOSIS — H348312 Tributary (branch) retinal vein occlusion, right eye, stable: Secondary | ICD-10-CM | POA: Insufficient documentation

## 2021-11-21 NOTE — Progress Notes (Signed)
11/21/2021     CHIEF COMPLAINT Patient presents for  Chief Complaint  Patient presents with   Retina Follow Up      HISTORY OF PRESENT ILLNESS: Ronald Harding is a 66 y.o. 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 I am not noticing any real blurriness or anything but I think it is worse."      Last edited by Demetrios Loll, COA on 11/21/2021  9:49 AM.      Referring physician: Myrlene Broker, MD [id number] [place],  [place] [address]  HISTORICAL INFORMATION:   Selected notes from the MEDICAL RECORD NUMBER    Lab Results  Component Value Date   HGBA1C 6.1 09/28/2021     CURRENT MEDICATIONS: No current outpatient medications on file. (Ophthalmic Drugs)   No current facility-administered medications for this visit. (Ophthalmic Drugs)   Current Outpatient Medications (Other)  Medication Sig   acetaminophen (TYLENOL) 325 MG tablet Take 650 mg by mouth every 6 (six) hours as needed.   albuterol (PROVENTIL HFA;VENTOLIN HFA) 108 (90 Base) MCG/ACT inhaler Inhale 2 puffs into the lungs every 6 (six) hours as needed for wheezing or shortness of breath.   aspirin 81 MG tablet Take 81 mg by mouth daily.   baclofen (LIORESAL) 10 MG tablet Take 1 tablet (10 mg total) by mouth 3 (three) times daily. (Patient not taking: Reported on 04/22/2021)   ibuprofen (ADVIL) 800 MG tablet Take 800 mg by mouth in the morning, at noon, and at bedtime.   losartan-hydrochlorothiazide (HYZAAR) 50-12.5 MG tablet TAKE ONE TABLET BY MOUTH ONE TIME DAILY   Multiple Vitamins-Minerals (AIRBORNE GUMMIES PO) Take 2 tablets by mouth daily.    No current facility-administered medications for this visit. (Other)      REVIEW OF SYSTEMS:    ALLERGIES Allergies  Allergen Reactions   Betadine [Povidone Iodine] Hives    Pt states in 2012 in [place] he fell and broke several bones. Betadine broke him out.     PAST MEDICAL HISTORY Past Medical History:  Diagnosis Date   Aortic aneurysm (HCC) 2019   4.6 cm ascending thoracic aortic aneurysm   Asthma    hx of-as a child   History of colon polyps    benign   Hypertension    takes Hyzaar daily   Joint pain    Pneumonia    hx of-in the 70's   SBO (small bowel obstruction) (HCC)    with NG tube placement   Past Surgical History:  Procedure Laterality Date   ANKLE SURGERY Right 10/23/2010   COLONOSCOPY     MULTIPLE EXTRACTIONS WITH ALVEOLOPLASTY N/A 09/23/2015   Procedure: Extraction of tooth #'s 1-12,14- 28, 30-32 with alveoloplasty, bilateral mandibular tori reductions and reduction of lateral exostoses.;  Surgeon: Charlynne Pander, DDS;  Location: [facility] OR;  Service: Oral Surgery;  Laterality: N/A;   NOSE SURGERY  10/24/2011   POLYPECTOMY  FAMILY HISTORY Family History  Problem Relation Age of Onset   Hypertension Mother    Arthritis Mother    Hypertension Father    Heart disease Father    Arthritis Father    Rectal cancer Other    Stomach cancer Other    Esophageal cancer Other    Colon cancer Other    Colon polyps Other     SOCIAL HISTORY Social History   Tobacco Use   Smoking status: Never   Smokeless tobacco: Current    Types: Chew   Tobacco comments:    one pack of chew per week  Vaping Use   Vaping Use: Never used  Substance Use Topics   Alcohol use: No    Alcohol/week: 0.0 standard drinks   Drug use: No         OPHTHALMIC EXAM:  Base Eye Exam     Visual Acuity (ETDRS)       Right Left   Dist cc 20/30 20/30 -2   Dist ph cc NI NI    Correction: Glasses         Tonometry (Tonopen, 9:53 AM)       Right Left    Pressure 15 12         Pupils       Pupils Dark Light Shape React APD   Right PERRL 5 4 Round Brisk None   Left PERRL 5 4 Round Brisk None         Visual Fields (Counting fingers)       Left Right    Full Full         Extraocular Movement       Right Left    Full, Ortho Full, Ortho         Neuro/Psych     Oriented x3: Yes         Dilation     Both eyes: 1.0% Mydriacyl, 2.5% Phenylephrine @ 9:53 AM           Slit Lamp and Fundus Exam     External Exam       Right Left   External Normal Normal         Slit Lamp Exam       Right Left   Lids/Lashes Normal Normal   Conjunctiva/Sclera White and quiet White and quiet   Cornea Clear Clear   Anterior Chamber Deep and quiet Deep and quiet   Iris Round and reactive Round and reactive   Lens 2+ Nuclear sclerosis 2+ Nuclear sclerosis   Anterior Vitreous Normal Normal         Fundus Exam       Right Left   Posterior Vitreous Normal Normal   Disc Normal Normal   C/D Ratio 0.35 0.35   Macula No detectable CME Normal   Vessels Normal Normal   Periphery Normal Normal            IMAGING AND PROCEDURES  Imaging and Procedures for 11/21/21  OCT, Retina - OU - Both Eyes       Right Eye Quality was good. Scan locations included subfoveal. Central Foveal Thickness: 300. Findings include abnormal foveal contour, cystoid macular edema.   Left Eye Quality was good. Scan locations included subfoveal. Central Foveal Thickness: 283.   Notes Minor perifoveal CME, as residual of prior macular BRVO, compensated, and ACT     Color Fundus Photography Optos - OU - Both Eyes       Right Eye  Progression has been stable. Disc findings include normal observations. Macula : normal observations. Vessels : normal observations. Periphery : normal observations.   Left Eye Progression has been stable. Disc findings include normal observations. Macula : normal observations. Vessels : normal observations.  Periphery : normal observations.              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 Aletta EdouardMy Le      ICD-10-CM   1. 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 Nuclear sclerotic cataract of both eyes  H25.13       1.  History of macular branch retinal vein occlusion right eye, onset 2016, status post injection 3 to 4 injections at around that time.  No follow-up here for the last 5 years.  No interval change in acuity  2.  3.  Ophthalmic Meds Ordered this visit:  No orders of the defined types were placed in this encounter.      Return in about 2 years (around 11/22/2023) for DILATE OU, COLOR FP, OCT.  There are no Patient Instructions on file for this visit.   Explained the diagnoses, plan, and follow up with the patient and they expressed understanding.  Patient expressed understanding of the importance of proper follow up care.   Alford HighlandGary A. Anessia Oakland M.D. Diseases & Surgery of the Retina and Vitreous [facility] 11/21/21     Abbreviations: M myopia (nearsighted); A astigmatism; H hyperopia (farsighted); P presbyopia; Mrx spectacle prescription;  CTL contact lenses; OD right eye; OS left eye; OU both eyes  XT exotropia; ET esotropia; PEK punctate epithelial keratitis; PEE punctate epithelial erosions; DES dry eye syndrome; MGD meibomian gland dysfunction; ATs artificial tears; PFAT's preservative free artificial tears; NSC nuclear sclerotic cataract; PSC posterior subcapsular cataract; ERM epi-retinal membrane; PVD posterior vitreous detachment; RD retinal  detachment; DM diabetes mellitus; DR diabetic retinopathy; NPDR non-proliferative diabetic retinopathy; PDR proliferative diabetic retinopathy; CSME clinically significant macular edema; DME diabetic macular edema; dbh dot blot hemorrhages; CWS cotton wool spot; POAG primary open angle glaucoma; C/D cup-to-disc ratio; HVF humphrey visual field; GVF goldmann visual field; OCT optical coherence tomography; IOP intraocular pressure; BRVO Branch retinal vein occlusion; CRVO central retinal vein occlusion; CRAO central retinal artery occlusion; BRAO branch retinal artery occlusion; RT retinal tear; SB scleral buckle; PPV pars plana vitrectomy; VH Vitreous hemorrhage; PRP panretinal laser photocoagulation; IVK intravitreal kenalog; VMT vitreomacular traction; MH Macular hole;  NVD neovascularization of the disc; NVE neovascularization elsewhere; AREDS age related eye disease study; ARMD age related macular degeneration; POAG primary open angle glaucoma; EBMD epithelial/anterior basement membrane dystrophy; ACIOL anterior chamber intraocular lens; IOL intraocular lens; PCIOL posterior chamber intraocular lens; Phaco/IOL phacoemulsification with intraocular lens placement; PRK photorefractive keratectomy; LASIK laser assisted in situ keratomileusis; HTN hypertension; DM diabetes mellitus; COPD chronic obstructive pulmonary disease

## 2021-11-21 NOTE — Assessment & Plan Note (Signed)
Minor OU follow-up with routine eye care at [facility], Dr. Anselm Jungling [place] Comment

## 2021-11-21 NOTE — Assessment & Plan Note (Signed)
Onset 2016, treated for macular BRVO at that time with 3 sequential injections antivegF with excellent resolution  No new symptoms no new findings now some 5 years follow-up

## 2021-12-06 DIAGNOSIS — H5203 Hypermetropia, bilateral: Secondary | ICD-10-CM | POA: Diagnosis not present

## 2021-12-06 DIAGNOSIS — H524 Presbyopia: Secondary | ICD-10-CM | POA: Diagnosis not present

## 2021-12-06 DIAGNOSIS — H52209 Unspecified astigmatism, unspecified eye: Secondary | ICD-10-CM | POA: Diagnosis not present

## 2021-12-15 DIAGNOSIS — M25571 Pain in right ankle and joints of right foot: Secondary | ICD-10-CM | POA: Diagnosis not present

## 2021-12-15 DIAGNOSIS — M1611 Unilateral primary osteoarthritis, right hip: Secondary | ICD-10-CM | POA: Insufficient documentation

## 2021-12-15 DIAGNOSIS — M160 Bilateral primary osteoarthritis of hip: Secondary | ICD-10-CM | POA: Diagnosis not present

## 2021-12-15 DIAGNOSIS — M5136 Other intervertebral disc degeneration, lumbar region: Secondary | ICD-10-CM | POA: Insufficient documentation

## 2021-12-15 DIAGNOSIS — M51369 Other intervertebral disc degeneration, lumbar region without mention of lumbar back pain or lower extremity pain: Secondary | ICD-10-CM | POA: Insufficient documentation

## 2021-12-15 DIAGNOSIS — M545 Low back pain, unspecified: Secondary | ICD-10-CM | POA: Diagnosis not present

## 2021-12-30 DIAGNOSIS — M5451 Vertebrogenic low back pain: Secondary | ICD-10-CM | POA: Diagnosis not present

## 2022-01-03 DIAGNOSIS — M1611 Unilateral primary osteoarthritis, right hip: Secondary | ICD-10-CM | POA: Diagnosis not present

## 2022-01-04 ENCOUNTER — Other Ambulatory Visit: Payer: Self-pay | Admitting: *Deleted

## 2022-01-04 DIAGNOSIS — I7121 Aneurysm of the ascending aorta, without rupture: Secondary | ICD-10-CM

## 2022-02-27 ENCOUNTER — Other Ambulatory Visit: Payer: Self-pay | Admitting: Cardiothoracic Surgery

## 2022-02-27 ENCOUNTER — Encounter: Payer: Self-pay | Admitting: Cardiothoracic Surgery

## 2022-02-27 ENCOUNTER — Ambulatory Visit: Payer: Medicare HMO | Admitting: Cardiothoracic Surgery

## 2022-02-27 ENCOUNTER — Ambulatory Visit
Admission: RE | Admit: 2022-02-27 | Discharge: 2022-02-27 | Disposition: A | Discharge status: Home / Self Care | Payer: Medicare HMO | Source: Ambulatory Visit | Attending: Cardiothoracic Surgery | Admitting: Cardiothoracic Surgery

## 2022-02-27 VITALS — BP 146/89 | HR 73 | Resp 20 | Ht 72.0 in | Wt 302.0 lb

## 2022-02-27 DIAGNOSIS — I7121 Aneurysm of the ascending aorta, without rupture: Secondary | ICD-10-CM | POA: Diagnosis not present

## 2022-02-27 DIAGNOSIS — I712 Thoracic aortic aneurysm, without rupture, unspecified: Secondary | ICD-10-CM | POA: Diagnosis not present

## 2022-02-27 NOTE — Progress Notes (Signed)
HP patient reports for scheduled follow-up with surveillance CT scan for a mild to moderate ascending fusiform aneurysm stable at 4.5 cm past several years.  Patient has hypertension but is controlled with losartan/HCT.  He denies chest pain.  His last echocardiogram was 2020 with normal LV function and trileaflet aortic valve without AI. ?I reviewed the images of his CT chest today which show no change in the ascending fusiform aneurysm measuring 4.5 cm in diameter.  Lungs are clear.  No pleural effusions. ? ?Current Outpatient Medications  ?Medication Sig Dispense Refill  ? acetaminophen (TYLENOL) 325 MG tablet Take 650 mg by mouth every 6 (six) hours as needed.    ? albuterol (PROVENTIL HFA;VENTOLIN HFA) 108 (90 Base) MCG/ACT inhaler Inhale 2 puffs into the lungs every 6 (six) hours as needed for wheezing or shortness of breath. 1 Inhaler 2  ? aspirin 81 MG tablet Take 81 mg by mouth daily.    ? baclofen (LIORESAL) 10 MG tablet Take 1 tablet (10 mg total) by mouth 3 (three) times daily. 30 each 0  ? ibuprofen (ADVIL) 800 MG tablet Take 800 mg by mouth in the morning, at noon, and at bedtime.    ? losartan-hydrochlorothiazide (HYZAAR) 50-12.5 MG tablet TAKE ONE TABLET BY MOUTH ONE TIME DAILY 90 tablet 2  ? Multiple Vitamins-Minerals (AIRBORNE GUMMIES PO) Take 2 tablets by mouth daily.    ? ?No current facility-administered medications for this visit.  ? ? ? 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  He will be scheduled for hip replacement after he loses about 30 more pounds. ? ?Physical Exam ? ?Blood pressure (!) 146/89, pulse 73, resp. rate 20, height 6' (1.829 m), weight (!) 302 lb (137 kg), SpO2 95 %.  ? ?  ?   Exam ? ?  General- alert and comfortable ?   Neck- no JVD, no cervical adenopathy palpable, no carotid bruit ?  Lungs- clear without rales, wheezes ?  Cor- regular rate and rhythm, no murmur , gallop ?  Abdomen- soft, non-tender ?  Extremities - warm, non-tender,  minimal edema ?  Neuro- oriented, appropriate, no focal weakness ? ?Diagnostic Tests: ?CT scan images personally reviewed which show a stable 4.5 cm ascending fusiform aortic aneurysm. ? ?Impression: ?Patient does not meet criteria for risk of dissection or indication for surgery of the moderate fusiform aneurysm at 4.5 cm diameter.  His aorta would not be a problem for anesthesia and hip replacement. ? ?Plan: ?Continue blood pressure control and monitoring ?Return for surveillance CT scan in 18 months ? ? ?Lovett Sox, MD ?[facility] ?[phone] ? ? ? ? ? ?

## 2022-05-16 ENCOUNTER — Telehealth: Payer: Self-pay | Admitting: Internal Medicine

## 2022-05-16 DIAGNOSIS — Z6841 Body Mass Index (BMI) 40.0 and over, adult: Secondary | ICD-10-CM | POA: Diagnosis not present

## 2022-05-16 DIAGNOSIS — M1611 Unilateral primary osteoarthritis, right hip: Secondary | ICD-10-CM | POA: Diagnosis not present

## 2022-05-16 NOTE — Telephone Encounter (Signed)
Needs visit as no recent EKG 

## 2022-05-16 NOTE — Telephone Encounter (Signed)
Placed in your box for review.  °

## 2022-05-16 NOTE — Telephone Encounter (Signed)
PT visits today with pre-op risk assessment to be filled out by Dr.Crawford. Form has been placed in Dr.Crawford's mailbox. PT would like this form faxed out once completed and would like to be notified once it is faxed.   FAX: [phone] CB: [phone]

## 2022-05-17 NOTE — Telephone Encounter (Signed)
Spoke with the patient and he has been scheduled for surgical clearance for 05/18/2022 at 11 am. Pt is aware of appt date and time. No other questions or concerns.

## 2022-05-18 ENCOUNTER — Encounter: Payer: Self-pay | Admitting: Internal Medicine

## 2022-05-18 ENCOUNTER — Ambulatory Visit (INDEPENDENT_AMBULATORY_CARE_PROVIDER_SITE_OTHER): Payer: Medicare HMO | Admitting: Internal Medicine

## 2022-05-18 DIAGNOSIS — Z01810 Encounter for preprocedural cardiovascular examination: Secondary | ICD-10-CM | POA: Diagnosis not present

## 2022-05-18 NOTE — Progress Notes (Signed)
   Subjective:   Patient ID: Ronald Harding, male    DOB: [birth year], 66 y.o.   MRN: [id number]  HPI The patient is a 66 YO man coming in for pre-op clearance to hip replacement. Has lost weight in order to qualify. No SOB or anginal symptoms. Activity limited by pain.  Review of Systems  Constitutional: Negative.   HENT: Negative.    Eyes: Negative.   Respiratory:  Negative for cough, chest tightness and shortness of breath.   Cardiovascular:  Negative for chest pain, palpitations and leg swelling.  Gastrointestinal:  Negative for abdominal distention, abdominal pain, constipation, diarrhea, nausea and vomiting.  Musculoskeletal:  Positive for arthralgias.  Skin: Negative.   Neurological: Negative.   Psychiatric/Behavioral: Negative.      Objective:  Physical Exam Constitutional:      Appearance: He is well-developed. He is obese.  HENT:     Head: Normocephalic and atraumatic.  Cardiovascular:     Rate and Rhythm: Normal rate and regular rhythm.  Pulmonary:     Effort: Pulmonary effort is normal. No respiratory distress.     Breath sounds: Normal breath sounds. No wheezing or rales.  Abdominal:     General: Bowel sounds are normal. There is no distension.     Palpations: Abdomen is soft.     Tenderness: There is no abdominal tenderness. There is no rebound.  Musculoskeletal:     Cervical back: Normal range of motion.  Skin:    General: Skin is warm and dry.  Neurological:     Mental Status: He is alert and oriented to person, place, and time.     Coordination: Coordination normal.     Vitals:   05/18/22 1052  BP: 102/66  Pulse: 74  Resp: 18  SpO2: 98%  Weight: 284 lb 3.2 oz (128.9 kg)  Height: 6' (1.829 m)   EKG: Rate 75, axis normal, interval RBBB, sinus, no st or t wave changes, no significant change compared to prior 2016   Assessment & Plan:

## 2022-05-18 NOTE — Patient Instructions (Signed)
We will fax the form to clear you for the surgery.

## 2022-05-19 DIAGNOSIS — Z01810 Encounter for preprocedural cardiovascular examination: Secondary | ICD-10-CM | POA: Insufficient documentation

## 2022-05-19 NOTE — Assessment & Plan Note (Signed)
EKG done today without change from 2016, RBBB unchanged. No further cardiac evaluation recommended and labs do not need updating prior to surgery. Counseled about nicotine cessation and bowel regimen post op depending on pain medication usage.

## 2022-06-07 ENCOUNTER — Ambulatory Visit: Payer: Self-pay | Admitting: Student

## 2022-06-29 NOTE — Patient Instructions (Addendum)
SURGICAL WAITING ROOM VISITATION Patients having surgery or a procedure may have no more than 2 support people in the waiting area - these visitors may rotate.   Children under the age of 61 must have an adult with them who is not the patient. If the patient needs to stay at the hospital during part of their recovery, the visitor guidelines for inpatient rooms apply. Pre-op nurse will coordinate an appropriate time for 1 support person to accompany patient in pre-op.  This support person may not rotate.    Please refer to the [facility] website for the visitor guidelines for Inpatients (after your surgery is over and you are in a regular room).    Your procedure is scheduled on: 07/12/22   Report to [facility] Main Entrance    Report to admitting at 6:00 AM   Call this number if you have problems the morning of surgery [phone]   Do not eat food :After Midnight.   After Midnight you may have the following liquids until 5:30 AM DAY OF SURGERY  Water Non-Citrus Juices (without pulp, NO RED) Carbonated Beverages Black Coffee (NO MILK/CREAM OR CREAMERS, sugar ok)  Clear Tea (NO MILK/CREAM OR CREAMERS, sugar ok) regular and decaf                             Plain Jell-O (NO RED)                                           Fruit ices (not with fruit pulp, NO RED)                                     Popsicles (NO RED)                                                               Sports drinks like Gatorade (NO RED)               The day of surgery:  Drink ONE (1) Pre-Surgery G2 at 5:30 AM the morning of surgery. Drink in one sitting. Do not sip.  This drink was given to you during your hospital  pre-op appointment visit. Nothing else to drink after completing the  Pre-Surgery G2.          If you have questions, please contact your surgeon's office.   FOLLOW BOWEL PREP AND ANY ADDITIONAL PRE OP INSTRUCTIONS YOU RECEIVED FROM YOUR SURGEON'S OFFICE!!!     Oral Hygiene is  also important to reduce your risk of infection.                                    Remember - BRUSH YOUR TEETH THE MORNING OF SURGERY WITH YOUR REGULAR TOOTHPASTE   Take these medicines the morning of surgery with A SIP OF WATER: Tylenol, Inhalers  You may not have any metal on your body including jewelry, and body piercing             Do not wear lotions, powders, cologne, or deodorant              Men may shave face and neck.   Do not bring valuables to the hospital.  IS NOT             RESPONSIBLE   FOR VALUABLES.   Contacts, dentures or bridgework may not be worn into surgery.  DO NOT BRING YOUR HOME MEDICATIONS TO THE HOSPITAL. PHARMACY WILL DISPENSE MEDICATIONS LISTED ON YOUR MEDICATION LIST TO YOU DURING YOUR ADMISSION IN THE HOSPITAL!    Patients discharged on the day of surgery will not be allowed to drive home.  Someone NEEDS to stay with you for the first 24 hours after anesthesia.   Special Instructions: Bring a copy of your healthcare power of attorney and living will documents         the day of surgery if you haven't scanned them before.              Please read over the following fact sheets you were given: IF YOU HAVE QUESTIONS ABOUT YOUR PRE-OP INSTRUCTIONS PLEASE CALL [phone]- [facility] - Preparing for Surgery Before surgery, you can play an important role.  Because skin is not sterile, your skin needs to be as free of germs as possible.  You can reduce the number of germs on your skin by washing with CHG (chlorahexidine gluconate) soap before surgery.  CHG is an antiseptic cleaner which kills germs and bonds with the skin to continue killing germs even after washing. Please DO NOT use if you have an allergy to CHG or antibacterial soaps.  If your skin becomes reddened/irritated stop using the CHG and inform your nurse when you arrive at Short Stay. Do not shave (including legs and underarms) for at least 48  hours prior to the first CHG shower.  You may shave your face/neck.  Please follow these instructions carefully:  1.  Shower with CHG Soap the night before surgery and the  morning of surgery.  2.  If you choose to wash your hair, wash your hair first as usual with your normal  shampoo.  3.  After you shampoo, rinse your hair and body thoroughly to remove the shampoo.                             4.  Use CHG as you would any other liquid soap.  You can apply chg directly to the skin and wash.  Gently with a scrungie or clean washcloth.  5.  Apply the CHG Soap to your body ONLY FROM THE NECK DOWN.   Do   not use on face/ open                           Wound or open sores. Avoid contact with eyes, ears mouth and   genitals (private parts).                       Wash face,  Genitals (private parts) with your normal soap.             6.  Wash thoroughly, paying special attention to the area where your  surgery  will be performed.  7.  Thoroughly rinse your body with warm water from the neck down.  8.  DO NOT shower/wash with your normal soap after using and rinsing off the CHG Soap.                9.  Pat yourself dry with a clean towel.            10.  Wear clean pajamas.            11.  Place clean sheets on your bed the night of your first shower and do not  sleep with pets. Day of Surgery : Do not apply any lotions/deodorants the morning of surgery.  Please wear clean clothes to the hospital/surgery center.  FAILURE TO FOLLOW THESE INSTRUCTIONS MAY RESULT IN THE CANCELLATION OF YOUR SURGERY  PATIENT SIGNATURE_________________________________  NURSE SIGNATURE__________________________________  ________________________________________________________________________   Ronald Harding  An incentive spirometer is a tool that can help keep your lungs clear and active. This tool measures how well you are filling your lungs with each breath. Taking long deep breaths may help reverse or  decrease the chance of developing breathing (pulmonary) problems (especially infection) following: A long period of time when you are unable to move or be active. BEFORE THE PROCEDURE  If the spirometer includes an indicator to show your best effort, your nurse or respiratory therapist will set it to a desired goal. If possible, sit up straight or lean slightly forward. Try not to slouch. Hold the incentive spirometer in an upright position. INSTRUCTIONS FOR USE  Sit on the edge of your bed if possible, or sit up as far as you can in bed or on a chair. Hold the incentive spirometer in an upright position. Breathe out normally. Place the mouthpiece in your mouth and seal your lips tightly around it. Breathe in slowly and as deeply as possible, raising the piston or the ball toward the top of the column. Hold your breath for 3-5 seconds or for as long as possible. Allow the piston or ball to fall to the bottom of the column. Remove the mouthpiece from your mouth and breathe out normally. Rest for a few seconds and repeat Steps 1 through 7 at least 10 times every 1-2 hours when you are awake. Take your time and take a few normal breaths between deep breaths. The spirometer may include an indicator to show your best effort. Use the indicator as a goal to work toward during each repetition. After each set of 10 deep breaths, practice coughing to be sure your lungs are clear. If you have an incision (the cut made at the time of surgery), support your incision when coughing by placing a pillow or rolled up towels firmly against it. Once you are able to get out of bed, walk around indoors and cough well. You may stop using the incentive spirometer when instructed by your caregiver.  RISKS AND COMPLICATIONS Take your time so you do not get dizzy or light-headed. If you are in pain, you may need to take or ask for pain medication before doing incentive spirometry. It is harder to take a deep breath if you  are having pain. AFTER USE Rest and breathe slowly and easily. It can be helpful to keep track of a log of your progress. Your caregiver can provide you with a simple table to help with this. If you are using the spirometer at home, follow these instructions: SEEK MEDICAL CARE IF:  You are having difficultly using the spirometer. You have trouble using the spirometer as often as instructed. Your pain medication is not giving enough relief while using the spirometer. You develop fever of 100.5 F (38.1 C) or higher. SEEK IMMEDIATE MEDICAL CARE IF:  You cough up bloody sputum that had not been present before. You develop fever of 102 F (38.9 C) or greater. You develop worsening pain at or near the incision site. MAKE SURE YOU:  Understand these instructions. Will watch your condition. Will get help right away if you are not doing well or get worse. Document Released: 02/19/2007 Document Revised: 01/01/2012 Document Reviewed: 04/22/2007 Exit[employer] Patient Information 2014 ExitCare, [place].   ________________________________________________________________________  WHAT IS A BLOOD TRANSFUSION? Blood Transfusion Information  A transfusion is the replacement of blood or some of its parts. Blood is made up of multiple cells which provide different functions. Red blood cells carry oxygen and are used for blood loss replacement. White blood cells fight against infection. Platelets control bleeding. Plasma helps clot blood. Other blood products are available for specialized needs, such as hemophilia or other clotting disorders. BEFORE THE TRANSFUSION  Who gives blood for transfusions?  Healthy volunteers who are fully evaluated to make sure their blood is safe. This is blood bank blood. Transfusion therapy is the safest it has ever been in the practice of medicine. Before blood is taken from a donor, a complete history is taken to make sure that person has no history of diseases nor engages  in risky social behavior (examples are intravenous drug use or sexual activity with multiple partners). The donor's travel history is screened to minimize risk of transmitting infections, such as malaria. The donated blood is tested for signs of infectious diseases, such as HIV and hepatitis. The blood is then tested to be sure it is compatible with you in order to minimize the chance of a transfusion reaction. If you or a relative donates blood, this is often done in anticipation of surgery and is not appropriate for emergency situations. It takes many days to process the donated blood. RISKS AND COMPLICATIONS Although transfusion therapy is very safe and saves many lives, the main dangers of transfusion include:  Getting an infectious disease. Developing a transfusion reaction. This is an allergic reaction to something in the blood you were given. Every precaution is taken to prevent this. The decision to have a blood transfusion has been considered carefully by your caregiver before blood is given. Blood is not given unless the benefits outweigh the risks. AFTER THE TRANSFUSION Right after receiving a blood transfusion, you will usually feel much better and more energetic. This is especially true if your red blood cells have gotten low (anemic). The transfusion raises the level of the red blood cells which carry oxygen, and this usually causes an energy increase. The nurse administering the transfusion will monitor you carefully for complications. HOME CARE INSTRUCTIONS  No special instructions are needed after a transfusion. You may find your energy is better. Speak with your caregiver about any limitations on activity for underlying diseases you may have. SEEK MEDICAL CARE IF:  Your condition is not improving after your transfusion. You develop redness or irritation at the intravenous (IV) site. SEEK IMMEDIATE MEDICAL CARE IF:  Any of the following symptoms occur over the next 12 hours: Shaking  chills. You have a temperature by mouth above 102 F (38.9 C), not controlled by medicine. Chest, back, or muscle pain. People around you feel you are not  acting correctly or are confused. Shortness of breath or difficulty breathing. Dizziness and fainting. You get a rash or develop hives. You have a decrease in urine output. Your urine turns a dark color or changes to pink, red, or brown. Any of the following symptoms occur over the next 10 days: You have a temperature by mouth above 102 F (38.9 C), not controlled by medicine. Shortness of breath. Weakness after normal activity. The white part of the eye turns yellow (jaundice). You have a decrease in the amount of urine or are urinating less often. Your urine turns a dark color or changes to pink, red, or brown. Document Released: 10/06/2000 Document Revised: 01/01/2012 Document Reviewed: 05/25/2008 [facility] Patient Information 2014 [place], [place].  _______________________________________________________________________

## 2022-06-29 NOTE — Progress Notes (Addendum)
COVID Vaccine Completed: yes x4  Date of COVID positive in last 90 days: no  PCP - Hillard Danker, MD Cardiologist - Kerin Perna, MD  Medical clearance by Hillard Danker 05/18/22 in Epic/on chart  Chest x-ray - 02/27/22 Epic EKG - 05/18/22 Epic Stress Test - 2012 per pt ECHO - 07/14/19 Epic Cardiac Cath - n/a Pacemaker/ICD device last checked: n/a Spinal Cord Stimulator: n/a  Bowel Prep - no  Sleep Study - n/a CPAP -   Fasting Blood Sugar - pre DM, no meds or checks at home Checks Blood Sugar _____ times a day  Blood Thinner Instructions: Aspirin Instructions: ASA 81, stopped 06/25/22 Last Dose:  Activity level: Can go up a flight of stairs and perform activities of daily living without stopping and without symptoms of chest pain or shortness of breath.    Anesthesia review:   Patient denies shortness of breath, fever, cough and chest pain at PAT appointment  Patient verbalized understanding of instructions that were given to them at the PAT appointment. Patient was also instructed that they will need to review over the PAT instructions again at home before surgery.

## 2022-06-30 ENCOUNTER — Encounter (HOSPITAL_COMMUNITY)
Admission: RE | Admit: 2022-06-30 | Discharge: 2022-06-30 | Disposition: A | Discharge status: Home / Self Care | Payer: Medicare HMO | Source: Ambulatory Visit | Attending: Orthopedic Surgery | Admitting: Orthopedic Surgery

## 2022-06-30 ENCOUNTER — Encounter (HOSPITAL_COMMUNITY): Payer: Self-pay

## 2022-06-30 VITALS — BP 125/77 | HR 64 | Temp 98.4°F | Resp 14 | Ht 72.0 in | Wt 284.2 lb

## 2022-06-30 DIAGNOSIS — Z01812 Encounter for preprocedural laboratory examination: Secondary | ICD-10-CM | POA: Insufficient documentation

## 2022-06-30 DIAGNOSIS — Z01818 Encounter for other preprocedural examination: Secondary | ICD-10-CM

## 2022-06-30 DIAGNOSIS — I10 Essential (primary) hypertension: Secondary | ICD-10-CM | POA: Insufficient documentation

## 2022-06-30 DIAGNOSIS — R7303 Prediabetes: Secondary | ICD-10-CM | POA: Insufficient documentation

## 2022-06-30 HISTORY — DX: Prediabetes: R73.03

## 2022-06-30 LAB — BASIC METABOLIC PANEL WITH GFR
Anion gap: 6 (ref 5–15)
BUN: 17 mg/dL (ref 8–23)
CO2: 21 mmol/L — ABNORMAL LOW (ref 22–32)
Calcium: 8.8 mg/dL — ABNORMAL LOW (ref 8.9–10.3)
Chloride: 108 mmol/L (ref 98–111)
Creatinine, Ser: 0.85 mg/dL (ref 0.61–1.24)
GFR, Estimated: >60 mL/min
Glucose, Bld: 84 mg/dL (ref 70–99)
Potassium: 3.4 mmol/L — ABNORMAL LOW (ref 3.5–5.1)
Sodium: 135 mmol/L (ref 135–145)

## 2022-06-30 LAB — CBC
HCT: 41.4 % (ref 39.0–52.0)
Hemoglobin: 13.9 g/dL (ref 13.0–17.0)
MCH: 29.6 pg (ref 26.0–34.0)
MCHC: 33.6 g/dL (ref 30.0–36.0)
MCV: 88.1 fL (ref 80.0–100.0)
Platelets: 335 10*3/uL (ref 150–400)
RBC: 4.7 MIL/uL (ref 4.22–5.81)
RDW: 13.6 % (ref 11.5–15.5)
WBC: 6.8 10*3/uL (ref 4.0–10.5)
nRBC: 0 % (ref 0.0–0.2)

## 2022-06-30 LAB — GLUCOSE, CAPILLARY: Glucose-Capillary: 90 mg/dL (ref 70–99)

## 2022-06-30 LAB — SURGICAL PCR SCREEN
MRSA, PCR: NEGATIVE
Staphylococcus aureus: NEGATIVE

## 2022-06-30 LAB — TYPE AND SCREEN
ABO/RH(D): O NEG
Antibody Screen: NEGATIVE

## 2022-06-30 LAB — HEMOGLOBIN A1C
Hgb A1c MFr Bld: 5.7 % — ABNORMAL HIGH (ref 4.8–5.6)
Mean Plasma Glucose: 116.89 mg/dL

## 2022-07-11 ENCOUNTER — Other Ambulatory Visit: Payer: Self-pay | Admitting: Internal Medicine

## 2022-07-11 DIAGNOSIS — I7121 Aneurysm of the ascending aorta, without rupture: Secondary | ICD-10-CM

## 2022-07-12 ENCOUNTER — Encounter (HOSPITAL_COMMUNITY): Admission: RE | Payer: Self-pay | Source: Ambulatory Visit

## 2022-07-12 ENCOUNTER — Ambulatory Visit (HOSPITAL_COMMUNITY): Admission: RE | Admit: 2022-07-12 | Payer: Medicare HMO | Source: Ambulatory Visit | Admitting: Orthopedic Surgery

## 2022-07-12 SURGERY — TOTAL HIP ARTHROPLASTY ANTERIOR APPROACH
Anesthesia: Spinal | Site: Hip | Laterality: Right

## 2022-09-29 ENCOUNTER — Ambulatory Visit: Payer: Medicare HMO

## 2022-09-29 ENCOUNTER — Encounter: Payer: Self-pay | Admitting: Internal Medicine

## 2022-09-29 ENCOUNTER — Ambulatory Visit (INDEPENDENT_AMBULATORY_CARE_PROVIDER_SITE_OTHER): Payer: Medicare HMO | Admitting: Internal Medicine

## 2022-09-29 VITALS — BP 120/78 | HR 73 | Temp 98.4°F | Ht 72.0 in | Wt 277.0 lb

## 2022-09-29 DIAGNOSIS — R7303 Prediabetes: Secondary | ICD-10-CM | POA: Diagnosis not present

## 2022-09-29 DIAGNOSIS — Z0000 Encounter for general adult medical examination without abnormal findings: Secondary | ICD-10-CM | POA: Diagnosis not present

## 2022-09-29 DIAGNOSIS — Z860100 Personal history of colon polyps, unspecified: Secondary | ICD-10-CM | POA: Insufficient documentation

## 2022-09-29 DIAGNOSIS — K641 Second degree hemorrhoids: Secondary | ICD-10-CM | POA: Insufficient documentation

## 2022-09-29 DIAGNOSIS — I712 Thoracic aortic aneurysm, without rupture, unspecified: Secondary | ICD-10-CM | POA: Diagnosis not present

## 2022-09-29 DIAGNOSIS — Z23 Encounter for immunization: Secondary | ICD-10-CM | POA: Diagnosis not present

## 2022-09-29 DIAGNOSIS — I700 Atherosclerosis of aorta: Secondary | ICD-10-CM | POA: Diagnosis not present

## 2022-09-29 DIAGNOSIS — I10 Essential (primary) hypertension: Secondary | ICD-10-CM

## 2022-09-29 DIAGNOSIS — M1611 Unilateral primary osteoarthritis, right hip: Secondary | ICD-10-CM | POA: Diagnosis not present

## 2022-09-29 DIAGNOSIS — N400 Enlarged prostate without lower urinary tract symptoms: Secondary | ICD-10-CM

## 2022-09-29 DIAGNOSIS — Z1211 Encounter for screening for malignant neoplasm of colon: Secondary | ICD-10-CM | POA: Insufficient documentation

## 2022-09-29 LAB — LIPID PANEL
Cholesterol: 127 mg/dL (ref 0–200)
HDL: 37.2 mg/dL — ABNORMAL LOW
LDL Cholesterol: 60 mg/dL (ref 0–99)
NonHDL: 90.29
Total CHOL/HDL Ratio: 3
Triglycerides: 150 mg/dL — ABNORMAL HIGH (ref 0.0–149.0)
VLDL: 30 mg/dL (ref 0.0–40.0)

## 2022-09-29 LAB — PSA: PSA: 1.9 ng/mL (ref 0.10–4.00)

## 2022-09-29 NOTE — Assessment & Plan Note (Signed)
Flu shot given. Covid-19 counseled. Pneumonia declines. Shingrix counseled to get at pharmacy. Tetanus declines. Colonoscopy up to date due 2032. Counseled about sun safety and mole surveillance. Counseled about the dangers of distracted driving. Given 10 year screening recommendations.

## 2022-09-29 NOTE — Assessment & Plan Note (Signed)
BMI 37 and complicated by hypertension and osteoarthritis. Counseled about diet and exercise.

## 2022-09-29 NOTE — Progress Notes (Signed)
Subjective:   Patient ID: Ronald Harding, male    DOB: [birth year], 66 y.o.   MRN: [id number]  HPI Here for medicare wellness and physical, no new complaints. Please see A/P for status and treatment of chronic medical problems.   Diet: heart healthy Physical activity: sedentary Depression/mood screen: negative Hearing: intact to whispered voice Visual acuity: grossly normal with lens, performs annual eye exam  ADLs: capable Fall risk: none Home safety: good Cognitive evaluation: intact to orientation, naming, recall and repetition EOL planning: adv directives discussed, not in place  [employer] Visit from 09/29/2022 in [facility] at [employer]  PHQ-2 Total Score 0       Flowsheet Row Office Visit from 09/29/2022 in [facility] at [facility]  PHQ-9 Total Score 0         01/20/2019    9:00 PM 01/21/2019    7:39 AM 09/09/2021   10:51 AM 05/18/2022   11:01 AM 09/29/2022   10:39 AM  Fall Risk  Falls in the past year?   0 0 0  Was there an injury with Fall?   0 0 0  Fall Risk Category Calculator   0 0 0  Fall Risk Category   Low Low Low  Patient Fall Risk Level Low fall risk Low fall risk Low fall risk  Low fall risk  Patient at Risk for Falls Due to     No Fall Risks  Fall risk Follow up     Falls evaluation completed    I have personally reviewed and have noted 1. The patient's medical and social history - reviewed today no changes 2. Their use of alcohol, tobacco or illicit drugs 3. Their current medications and supplements 4. The patient's functional ability including ADL's, fall risks, home safety risks and hearing or visual impairment. 5. Diet and physical activities 6. Evidence for depression or mood disorders 7. Care team reviewed and updated 8.  The patient is not on an opioid pain medication.  Patient Care Team: Myrlene Broker, MD as PCP - General (Internal Medicine) Past Medical History:  Diagnosis Date   Aortic aneurysm  (HCC) 2019   4.6 cm ascending thoracic aortic aneurysm   Asthma    hx of-as a child   History of colon polyps    benign   Hypertension    takes Hyzaar daily   Joint pain    Pneumonia    hx of-in the 70's   Pre-diabetes    SBO (small bowel obstruction) (HCC)    with NG tube placement   Past Surgical History:  Procedure Laterality Date   ANKLE SURGERY Right 10/23/2010   COLONOSCOPY     MULTIPLE EXTRACTIONS WITH ALVEOLOPLASTY N/A 09/23/2015   Procedure: Extraction of tooth #'s 1-12,14- 28, 30-32 with alveoloplasty, bilateral mandibular tori reductions and reduction of lateral exostoses.;  Surgeon: Charlynne Pander, DDS;  Location: [facility] OR;  Service: Oral Surgery;  Laterality: N/A;   NOSE SURGERY  10/24/2011   POLYPECTOMY     Family History  Problem Relation Age of Onset   Hypertension Mother    Arthritis Mother    Hypertension Father    Heart disease Father    Arthritis Father    Rectal cancer Other    Stomach cancer Other    Esophageal cancer Other    Colon cancer Other    Colon polyps Other    Review of Systems  Constitutional: Negative.   HENT: Negative.    Eyes:  Negative.   Respiratory:  Negative for cough, chest tightness and shortness of breath.   Cardiovascular:  Negative for chest pain, palpitations and leg swelling.  Gastrointestinal:  Negative for abdominal distention, abdominal pain, constipation, diarrhea, nausea and vomiting.  Musculoskeletal:  Positive for arthralgias.  Skin: Negative.   Neurological: Negative.   Psychiatric/Behavioral: Negative.      Objective:  Physical Exam Constitutional:      Appearance: He is well-developed. He is obese.  HENT:     Head: Normocephalic and atraumatic.  Cardiovascular:     Rate and Rhythm: Normal rate and regular rhythm.  Pulmonary:     Effort: Pulmonary effort is normal. No respiratory distress.     Breath sounds: Normal breath sounds. No wheezing or rales.  Abdominal:     General: Bowel sounds are normal.  There is no distension.     Palpations: Abdomen is soft.     Tenderness: There is no abdominal tenderness. There is no rebound.  Musculoskeletal:        General: Tenderness present.     Cervical back: Normal range of motion.  Skin:    General: Skin is warm and dry.  Neurological:     Mental Status: He is alert and oriented to person, place, and time.     Coordination: Coordination normal.     Vitals:   09/29/22 1040  BP: 120/78  Pulse: 73  Temp: 98.4 F (36.9 C)  TempSrc: Oral  SpO2: 99%  Weight: 277 lb (125.6 kg)  Height: 6' (1.829 m)    Assessment & Plan:  Flu shot given at visit

## 2022-09-29 NOTE — Assessment & Plan Note (Signed)
BP at goal on losartan/hctz 50/12.5 mg daily. Recent renal panel without indication for change.

## 2022-09-29 NOTE — Assessment & Plan Note (Signed)
He is planning surgery next summer to avoid missing work.

## 2022-09-29 NOTE — Assessment & Plan Note (Signed)
He continues follow up with vacular and ongoing monitoring.

## 2022-09-29 NOTE — Assessment & Plan Note (Signed)
Taking aspirin 81 mg daily and checking lipid panel today.

## 2022-09-29 NOTE — Patient Instructions (Signed)
Get the shingles vaccine at the pharmacy

## 2022-09-29 NOTE — Assessment & Plan Note (Signed)
Most recent HgA1c down to 5.7. Will continue to track every 12 months. Losing weight has helped significantly.

## 2022-11-17 ENCOUNTER — Other Ambulatory Visit: Payer: Self-pay | Admitting: Internal Medicine

## 2022-11-17 DIAGNOSIS — I7121 Aneurysm of the ascending aorta, without rupture: Secondary | ICD-10-CM

## 2022-11-27 ENCOUNTER — Telehealth: Payer: Self-pay | Admitting: Internal Medicine

## 2022-11-27 NOTE — Telephone Encounter (Signed)
For our records:  We have received Pre-Op PW for the pt and it has been placed in Dr. Nathanial Millman boxes.   Upon completion please fax to: [phone]

## 2022-11-28 NOTE — Telephone Encounter (Signed)
Received and have scheduled patient for an appointment.

## 2022-12-07 DIAGNOSIS — H5203 Hypermetropia, bilateral: Secondary | ICD-10-CM | POA: Diagnosis not present

## 2022-12-19 ENCOUNTER — Ambulatory Visit: Payer: Medicare HMO | Admitting: Internal Medicine

## 2022-12-28 ENCOUNTER — Encounter (INDEPENDENT_AMBULATORY_CARE_PROVIDER_SITE_OTHER): Payer: Medicare HMO | Admitting: Ophthalmology

## 2023-02-12 NOTE — Progress Notes (Signed)
Surgery orders requested via Epic inbox. °

## 2023-02-13 ENCOUNTER — Ambulatory Visit: Payer: Self-pay | Admitting: Student

## 2023-02-16 NOTE — Patient Instructions (Signed)
DUE TO COVID-19 ONLY TWO VISITORS  (aged 67 and older)  ARE ALLOWED TO COME WITH YOU AND STAY IN THE WAITING ROOM ONLY DURING PRE OP AND PROCEDURE.   **NO VISITORS ARE ALLOWED IN THE SHORT STAY AREA OR RECOVERY ROOM!!**  IF YOU WILL BE ADMITTED INTO THE HOSPITAL YOU ARE ALLOWED ONLY FOUR SUPPORT PEOPLE DURING VISITATION HOURS ONLY (7 AM -8PM)   The support person(s) must pass our screening, gel in and out, and wear a mask at all times, including in the patient's room. Patients must also wear a mask when staff or their support person are in the room. Visitors GUEST BADGE MUST BE WORN VISIBLY  One adult visitor may remain with you overnight and MUST be in the room by 8 P.M.     Your procedure is scheduled on: 02/28/22   Report to [facility] Main Entrance    Report to admitting at : 5:15 AM   Call this number if you have problems the morning of surgery [phone]   Do not eat food :After Midnight.   After Midnight you may have the following liquids until: 4:30 AM DAY OF SURGERY  Water Black Coffee (sugar ok, NO MILK/CREAM OR CREAMERS)  Tea (sugar ok, NO MILK/CREAM OR CREAMERS) regular and decaf                             Plain Jell-O (NO RED)                                           Fruit ices (not with fruit pulp, NO RED)                                     Popsicles (NO RED)                                                                  Juice: apple, WHITE grape, WHITE cranberry Sports drinks like Gatorade (NO RED)   The day of surgery:  Drink ONE (1) Pre-Surgery Clear or G2 at: 4:30 AM the morning of surgery. Drink in one sitting. Do not sip.  This drink was given to you during your hospital  pre-op appointment visit. Nothing else to drink after completing the  Pre-Surgery Clear Ensure or G2.          If you have questions, please contact your surgeon's office.   Oral Hygiene is also important to reduce your risk of infection.                                     Remember - BRUSH YOUR TEETH THE MORNING OF SURGERY WITH YOUR REGULAR TOOTHPASTE  DENTURES WILL BE REMOVED PRIOR TO SURGERY PLEASE DO NOT APPLY "Poly grip" OR ADHESIVES!!!   Do NOT smoke after Midnight   Take these medicines the morning of surgery with A SIP OF WATER: N/A. Use inhalers as usual.  You may not have any metal on your body including hair pins, jewelry, and body piercing             Do not wear lotions, powders, perfumes/cologne, or deodorant              Men may shave face and neck.   Do not bring valuables to the hospital. [facility] IS NOT             RESPONSIBLE   FOR VALUABLES.   Contacts, glasses, or bridgework may not be worn into surgery.   Bring small overnight bag day of surgery.   DO NOT BRING YOUR HOME MEDICATIONS TO THE HOSPITAL. PHARMACY WILL DISPENSE MEDICATIONS LISTED ON YOUR MEDICATION LIST TO YOU DURING YOUR ADMISSION IN THE HOSPITAL!    Patients discharged on the day of surgery will not be allowed to drive home.  Someone NEEDS to stay with you for the first 24 hours after anesthesia.   Special Instructions: Bring a copy of your healthcare power of attorney and living will documents         the day of surgery if you haven't scanned them before.              Please read over the following fact sheets you were given: IF YOU HAVE QUESTIONS ABOUT YOUR PRE-OP INSTRUCTIONS PLEASE CALL [phone]  PATIENT SIGNATURE_________________________________  NURSE SIGNATURE__________________________________  ________________________________________________________________________  Ronald Harding  An incentive spirometer is a tool that can help keep your lungs clear and active. This tool measures how well you are filling your lungs with each breath. Taking long deep breaths may help reverse or decrease the chance of developing breathing (pulmonary) problems (especially infection) following: A long period of time when you are  unable to move or be active. BEFORE THE PROCEDURE  If the spirometer includes an indicator to show your best effort, your nurse or respiratory therapist will set it to a desired goal. If possible, sit up straight or lean slightly forward. Try not to slouch. Hold the incentive spirometer in an upright position. INSTRUCTIONS FOR USE  Sit on the edge of your bed if possible, or sit up as far as you can in bed or on a chair. Hold the incentive spirometer in an upright position. Breathe out normally. Place the mouthpiece in your mouth and seal your lips tightly around it. Breathe in slowly and as deeply as possible, raising the piston or the ball toward the top of the column. Hold your breath for 3-5 seconds or for as long as possible. Allow the piston or ball to fall to the bottom of the column. Remove the mouthpiece from your mouth and breathe out normally. Rest for a few seconds and repeat Steps 1 through 7 at least 10 times every 1-2 hours when you are awake. Take your time and take a few normal breaths between deep breaths. The spirometer may include an indicator to show your best effort. Use the indicator as a goal to work toward during each repetition. After each set of 10 deep breaths, practice coughing to be sure your lungs are clear. If you have an incision (the cut made at the time of surgery), support your incision when coughing by placing a pillow or rolled up towels firmly against it. Once you are able to get out of bed, walk around indoors and cough well. You may stop using the incentive spirometer when instructed by your caregiver.  RISKS AND COMPLICATIONS Take your time so you  do not get dizzy or light-headed. If you are in pain, you may need to take or ask for pain medication before doing incentive spirometry. It is harder to take a deep breath if you are having pain. AFTER USE Rest and breathe slowly and easily. It can be helpful to keep track of a log of your progress. Your  caregiver can provide you with a simple table to help with this. If you are using the spirometer at home, follow these instructions: SEEK MEDICAL CARE IF:  You are having difficultly using the spirometer. You have trouble using the spirometer as often as instructed. Your pain medication is not giving enough relief while using the spirometer. You develop fever of 100.5 F (38.1 C) or higher. SEEK IMMEDIATE MEDICAL CARE IF:  You cough up bloody sputum that had not been present before. You develop fever of 102 F (38.9 C) or greater. You develop worsening pain at or near the incision site. MAKE SURE YOU:  Understand these instructions. Will watch your condition. Will get help right away if you are not doing well or get worse. Document Released: 02/19/2007 Document Revised: 01/01/2012 Document Reviewed: 04/22/2007 [facility] Patient Information 2014 [place], [place].   ________________________________________________________________________

## 2023-02-19 ENCOUNTER — Encounter (HOSPITAL_COMMUNITY): Payer: Self-pay

## 2023-02-19 ENCOUNTER — Other Ambulatory Visit: Payer: Self-pay

## 2023-02-19 ENCOUNTER — Encounter (HOSPITAL_COMMUNITY)
Admission: RE | Admit: 2023-02-19 | Discharge: 2023-02-19 | Disposition: A | Discharge status: Home / Self Care | Payer: Medicare HMO | Source: Ambulatory Visit | Attending: Orthopedic Surgery | Admitting: Orthopedic Surgery

## 2023-02-19 VITALS — BP 112/66 | HR 60 | Temp 98.6°F | Ht 72.0 in | Wt 275.0 lb

## 2023-02-19 DIAGNOSIS — I10 Essential (primary) hypertension: Secondary | ICD-10-CM | POA: Diagnosis not present

## 2023-02-19 DIAGNOSIS — Z01812 Encounter for preprocedural laboratory examination: Secondary | ICD-10-CM | POA: Diagnosis present

## 2023-02-19 DIAGNOSIS — R7303 Prediabetes: Secondary | ICD-10-CM

## 2023-02-19 DIAGNOSIS — Z01818 Encounter for other preprocedural examination: Secondary | ICD-10-CM

## 2023-02-19 HISTORY — DX: Unspecified osteoarthritis, unspecified site: M19.90

## 2023-02-19 LAB — CBC
HCT: 42.5 % (ref 39.0–52.0)
Hemoglobin: 13.9 g/dL (ref 13.0–17.0)
MCH: 29 pg (ref 26.0–34.0)
MCHC: 32.7 g/dL (ref 30.0–36.0)
MCV: 88.5 fL (ref 80.0–100.0)
Platelets: 349 10*3/uL (ref 150–400)
RBC: 4.8 MIL/uL (ref 4.22–5.81)
RDW: 13.2 % (ref 11.5–15.5)
WBC: 8.7 10*3/uL (ref 4.0–10.5)
nRBC: 0 % (ref 0.0–0.2)

## 2023-02-19 LAB — BASIC METABOLIC PANEL WITH GFR
Anion gap: 10 (ref 5–15)
BUN: 11 mg/dL (ref 8–23)
CO2: 22 mmol/L (ref 22–32)
Calcium: 8.7 mg/dL — ABNORMAL LOW (ref 8.9–10.3)
Chloride: 106 mmol/L (ref 98–111)
Creatinine, Ser: 0.79 mg/dL (ref 0.61–1.24)
GFR, Estimated: >60 mL/min
Glucose, Bld: 93 mg/dL (ref 70–99)
Potassium: 3.4 mmol/L — ABNORMAL LOW (ref 3.5–5.1)
Sodium: 138 mmol/L (ref 135–145)

## 2023-02-19 LAB — SURGICAL PCR SCREEN
MRSA, PCR: NEGATIVE
Staphylococcus aureus: NEGATIVE

## 2023-02-19 LAB — HEMOGLOBIN A1C
Hgb A1c MFr Bld: 5.7 % — ABNORMAL HIGH (ref 4.8–5.6)
Mean Plasma Glucose: 116.89 mg/dL

## 2023-02-19 LAB — GLUCOSE, CAPILLARY: Glucose-Capillary: 92 mg/dL (ref 70–99)

## 2023-02-19 NOTE — Progress Notes (Addendum)
For Short Stay: COVID SWAB appointment date:  Bowel Prep reminder:   For Anesthesia: PCP - Dr. Hillard Danker. Cardiologist - N/A  Chest x-ray - CT Chest: 02/27/22 EKG - 05/18/22 Stress Test -  ECHO - 07/14/19 Cardiac Cath -  Pacemaker/ICD device last checked: Pacemaker orders received: Device Rep notified:  Spinal Cord Stimulator:  Sleep Study -  CPAP -   Fasting Blood Sugar - N/A Checks Blood Sugar ___0__ times a day Date and result of last Hgb A1c- 5.7:  06/30/22  Last dose of GLP1 agonist- N/A GLP1 instructions:   Last dose of SGLT-2 inhibitors-  SGLT-2 instructions:   Blood Thinner Instructions: Aspirin Instructions: Last Dose:  Activity level: Can go up a flight of stairs and activities of daily living without stopping and without chest pain and/or shortness of breath   Able to exercise without chest pain and/or shortness of breath  Anesthesia review: Hx: Pre-DIA,HTN  Patient denies shortness of breath, fever, cough and chest pain at PAT appointment   Patient verbalized understanding of instructions that were given to them at the PAT appointment. Patient was also instructed that they will need to review over the PAT instructions again at home before surgery.

## 2023-02-25 ENCOUNTER — Ambulatory Visit: Payer: Self-pay | Admitting: Student

## 2023-02-25 NOTE — H&P (Signed)
TOTAL HIP ADMISSION H&P  Patient is admitted for right total hip arthroplasty.  Subjective:  Chief Complaint: right hip pain  HPI: Ronald Harding, 67 y.o. male, has a history of pain and functional disability in the right hip(s) due to arthritis and patient has failed non-surgical conservative treatments for greater than 12 weeks to include NSAID's and/or analgesics, flexibility and strengthening excercises, use of assistive devices, weight reduction as appropriate, and activity modification.  Onset of symptoms was gradual starting 10 years ago with rapidlly worsening course since that time.The patient noted no past surgery on the right hip(s).  Patient currently rates pain in the right hip at 10 out of 10 with activity. Patient has night pain, worsening of pain with activity and weight bearing, trendelenberg gait, pain that interfers with activities of daily living, and pain with passive range of motion. Patient has evidence of subchondral cysts, subchondral sclerosis, periarticular osteophytes, and joint space narrowing by imaging studies. This condition presents safety issues increasing the risk of falls.  There is no current active infection.  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   Routine general medical examination at a health care facility 06/30/2016   Thoracic aortic aneurysm (TAA) (HCC) 08/20/2015   Essential hypertension 06/30/2015   Morbid obesity (HCC) 06/30/2015   Past Medical History:  Diagnosis Date    Aortic aneurysm (HCC) 2019   4.6 cm ascending thoracic aortic aneurysm   Arthritis    Asthma    hx of-as a child   History of colon polyps    benign   Hypertension    takes Hyzaar daily   Joint pain    Pneumonia    hx of-in the 70's   Pre-diabetes    SBO (small bowel obstruction) (HCC)    with NG tube placement    Past Surgical History:  Procedure Laterality Date   ANKLE SURGERY Right 10/23/2010   COLONOSCOPY     MULTIPLE EXTRACTIONS WITH ALVEOLOPLASTY N/A 09/23/2015   Procedure: Extraction of tooth #'s 1-12,14- 28, 30-32 with alveoloplasty, bilateral mandibular tori reductions and reduction of lateral exostoses.;  Surgeon: Charlynne Pander, DDS;  Location: [facility] OR;  Service: Oral Surgery;  Laterality: N/A;   NOSE SURGERY  10/24/2011   POLYPECTOMY      Current Outpatient Medications  Medication Sig Dispense Refill Last Dose   albuterol (PROVENTIL HFA;VENTOLIN HFA) 108 (90 Base) MCG/ACT inhaler Inhale 2 puffs into the lungs every 6 (six) hours as needed for wheezing or shortness of breath. 1 Inhaler 2    aspirin 81 MG tablet Take 81 mg by mouth daily.      losartan-hydrochlorothiazide (HYZAAR) 50-12.5 MG tablet TAKE ONE TABLET BY MOUTH ONE TIME DAILY 90 tablet 3    No current facility-administered medications for this visit.   Allergies  Allergen Reactions   Betadine [Povidone Iodine] Hives    Pt states in 2012 in [place] he fell and broke several bones. Betadine broke him out.     Social History   Tobacco Use   Smoking status: Never  Smokeless tobacco: Former    Types: Chew   Tobacco comments:    one pack of chew per week  Substance Use Topics   Alcohol use: No    Alcohol/week: 0.0 standard drinks of alcohol    Family History  Problem Relation Age of Onset   Hypertension Mother    Arthritis Mother    Hypertension Father    Heart disease Father    Arthritis Father    Rectal cancer Other    Stomach cancer Other    Esophageal cancer Other    Colon cancer  Other    Colon polyps Other      Review of Systems  Musculoskeletal:  Positive for arthralgias and gait problem.  All other systems reviewed and are negative.   Objective:  Physical Exam Constitutional:      Appearance: Normal appearance.  HENT:     Head: Normocephalic and atraumatic.     Nose: Nose normal.     Mouth/Throat:     Mouth: Mucous membranes are moist.     Pharynx: Oropharynx is clear.  Eyes:     Conjunctiva/sclera: Conjunctivae normal.  Cardiovascular:     Rate and Rhythm: Normal rate and regular rhythm.     Pulses: Normal pulses.     Heart sounds: Normal heart sounds.  Pulmonary:     Effort: Pulmonary effort is normal.     Breath sounds: Normal breath sounds.  Abdominal:     General: Abdomen is flat.     Palpations: Abdomen is soft.  Genitourinary:    Comments: deferred Musculoskeletal:     Cervical back: Normal range of motion and neck supple.     Comments: Examination of the right hip reveals no skin wounds or lesions. No significant trochanteric tenderness to palpation. He has severely restricted range of motion. There is a 35 degree flexion contracture, and I can flex him up to 80 degrees. With flexion of the hip, there is mandatory external rotation to about 10 degrees. He further externally rotates about 10 degrees. There is no internal rotation. Pain with terminal flexion and rotation. Pain in the position of impingement. Positive Stinchfield.  Distally, there is no focal motor or sensory deficit. He has palpable pedal pulses. He does have chronic right lower extremity swelling from previous ankle reconstruction surgery.  He ambulates with a severely antalgic gait using a cane.  Skin:    General: Skin is warm and dry.     Capillary Refill: Capillary refill takes less than 2 seconds.  Neurological:     General: No focal deficit present.     Mental Status: He is alert and oriented to person, place, and time.  Psychiatric:        Mood and Affect: Mood  normal.        Behavior: Behavior normal.        Thought Content: Thought content normal.        Judgment: Judgment normal.     Vital signs in last 24 hours: @VSRANGES @  Labs:   Estimated body mass index is 37.3 kg/m as calculated from the following:   Height as of 02/19/23: 6' (1.829 m).   Weight as of 02/19/23: 124.7 kg.   Imaging Review Plain radiographs demonstrate severe degenerative joint disease of the right hip(s). The bone quality appears to be adequate for age and reported activity level.      Assessment/Plan:  End stage arthritis, right hip(s)  The patient history, physical examination, clinical judgement of the provider  and imaging studies are consistent with end stage degenerative joint disease of the right hip(s) and total hip arthroplasty is deemed medically necessary. The treatment options including medical management, injection therapy, arthroscopy and arthroplasty were discussed at length. The risks and benefits of total hip arthroplasty were presented and reviewed. The risks due to aseptic loosening, infection, stiffness, dislocation/subluxation,  thromboembolic complications and other imponderables were discussed.  The patient acknowledged the explanation, agreed to proceed with the plan and consent was signed. Patient is being admitted for inpatient treatment for surgery, pain control, PT, OT, prophylactic antibiotics, VTE prophylaxis, progressive ambulation and ADL's and discharge planning.The patient is planning to be discharged home with HEP.   Therapy Plans: HEP. Disposition: Home with wife and daughters Planned DVT Prophylaxis: aspirin 81mg  BID DME needed: walker. PCP: Cleared.  TXA: IV Allergies:  - Betadine - hives Anesthesia Concerns: None.  BMI: 38.7 Last HgbA1c: 5.7 Other: - Stable aortic aneurysm.  - Asthma.  - Aspirin 81 mg baseline - Hydrocodone, zofran.  - 02/19/23: Hgb 13.9,  K+ 3.4, Cr. 0.79.     Patient's anticipated LOS is less  than 2 midnights, meeting these requirements: - Younger than 14 - Lives within 1 hour of care - Has a competent adult at home to recover with post-op recover - NO history of  - Chronic pain requiring opiods  - Diabetes  - Coronary Artery Disease  - Heart failure  - Heart attack  - Stroke  - DVT/VTE  - Cardiac arrhythmia  - Respiratory Failure/COPD  - Renal failure  - Anemia  - Advanced Liver disease

## 2023-02-25 NOTE — H&P (View-Only) (Signed)
TOTAL HIP ADMISSION H&P  Patient is admitted for right total hip arthroplasty.  Subjective:  Chief Complaint: right hip pain  HPI: Ronald Harding, 66 y.o. male, has a history of pain and functional disability in the right hip(s) due to arthritis and patient has failed non-surgical conservative treatments for greater than 12 weeks to include NSAID's and/or analgesics, flexibility and strengthening excercises, use of assistive devices, weight reduction as appropriate, and activity modification.  Onset of symptoms was gradual starting 10 years ago with rapidlly worsening course since that time.The patient noted no past surgery on the right hip(s).  Patient currently rates pain in the right hip at 10 out of 10 with activity. Patient has night pain, worsening of pain with activity and weight bearing, trendelenberg gait, pain that interfers with activities of daily living, and pain with passive range of motion. Patient has evidence of subchondral cysts, subchondral sclerosis, periarticular osteophytes, and joint space narrowing by imaging studies. This condition presents safety issues increasing the risk of falls.  There is no current active infection.  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   Routine general medical examination at a health care facility 06/30/2016   Thoracic aortic aneurysm (TAA) (HCC) 08/20/2015   Essential hypertension 06/30/2015   Morbid obesity (HCC) 06/30/2015   Past Medical History:  Diagnosis Date    Aortic aneurysm (HCC) 2019   4.6 cm ascending thoracic aortic aneurysm   Arthritis    Asthma    hx of-as a child   History of colon polyps    benign   Hypertension    takes Hyzaar daily   Joint pain    Pneumonia    hx of-in the 70's   Pre-diabetes    SBO (small bowel obstruction) (HCC)    with NG tube placement    Past Surgical History:  Procedure Laterality Date   ANKLE SURGERY Right 10/23/2010   COLONOSCOPY     MULTIPLE EXTRACTIONS WITH ALVEOLOPLASTY N/A 09/23/2015   Procedure: Extraction of tooth #'s 1-12,14- 28, 30-32 with alveoloplasty, bilateral mandibular tori reductions and reduction of lateral exostoses.;  Surgeon: Ronald F Kulinski, DDS;  Location: [facility] OR;  Service: Oral Surgery;  Laterality: N/A;   NOSE SURGERY  10/24/2011   POLYPECTOMY      Current Outpatient Medications  Medication Sig Dispense Refill Last Dose   albuterol (PROVENTIL HFA;VENTOLIN HFA) 108 (90 Base) MCG/ACT inhaler Inhale 2 puffs into the lungs every 6 (six) hours as needed for wheezing or shortness of breath. 1 Inhaler 2    aspirin 81 MG tablet Take 81 mg by mouth daily.      losartan-hydrochlorothiazide (HYZAAR) 50-12.5 MG tablet TAKE ONE TABLET BY MOUTH ONE TIME DAILY 90 tablet 3    No current facility-administered medications for this visit.   Allergies  Allergen Reactions   Betadine [Povidone Iodine] Hives    Pt states in 2012 in [place] he fell and broke several bones. Betadine broke him out.     Social History   Tobacco Use   Smoking status: Never     Smokeless tobacco: Former    Types: Chew   Tobacco comments:    one pack of chew per week  Substance Use Topics   Alcohol use: No    Alcohol/week: 0.0 standard drinks of alcohol    Family History  Problem Relation Age of Onset   Hypertension Mother    Arthritis Mother    Hypertension Father    Heart disease Father    Arthritis Father    Rectal cancer Other    Stomach cancer Other    Esophageal cancer Other    Colon cancer  Other    Colon polyps Other      Review of Systems  Musculoskeletal:  Positive for arthralgias and gait problem.  All other systems reviewed and are negative.   Objective:  Physical Exam Constitutional:      Appearance: Normal appearance.  HENT:     Head: Normocephalic and atraumatic.     Nose: Nose normal.     Mouth/Throat:     Mouth: Mucous membranes are moist.     Pharynx: Oropharynx is clear.  Eyes:     Conjunctiva/sclera: Conjunctivae normal.  Cardiovascular:     Rate and Rhythm: Normal rate and regular rhythm.     Pulses: Normal pulses.     Heart sounds: Normal heart sounds.  Pulmonary:     Effort: Pulmonary effort is normal.     Breath sounds: Normal breath sounds.  Abdominal:     General: Abdomen is flat.     Palpations: Abdomen is soft.  Genitourinary:    Comments: deferred Musculoskeletal:     Cervical back: Normal range of motion and neck supple.     Comments: Examination of the right hip reveals no skin wounds or lesions. No significant trochanteric tenderness to palpation. He has severely restricted range of motion. There is a 35 degree flexion contracture, and I can flex him up to 80 degrees. With flexion of the hip, there is mandatory external rotation to about 10 degrees. He further externally rotates about 10 degrees. There is no internal rotation. Pain with terminal flexion and rotation. Pain in the position of impingement. Positive Stinchfield.  Distally, there is no focal motor or sensory deficit. He has palpable pedal pulses. He does have chronic right lower extremity swelling from previous ankle reconstruction surgery.  He ambulates with a severely antalgic gait using a cane.  Skin:    General: Skin is warm and dry.     Capillary Refill: Capillary refill takes less than 2 seconds.  Neurological:     General: No focal deficit present.     Mental Status: He is alert and oriented to person, place, and time.  Psychiatric:        Mood and Affect: Mood  normal.        Behavior: Behavior normal.        Thought Content: Thought content normal.        Judgment: Judgment normal.     Vital signs in last 24 hours: @VSRANGES@  Labs:   Estimated body mass index is 37.3 kg/m as calculated from the following:   Height as of 02/19/23: 6' (1.829 m).   Weight as of 02/19/23: 124.7 kg.   Imaging Review Plain radiographs demonstrate severe degenerative joint disease of the right hip(s). The bone quality appears to be adequate for age and reported activity level.      Assessment/Plan:  End stage arthritis, right hip(s)  The patient history, physical examination, clinical judgement of the provider   and imaging studies are consistent with end stage degenerative joint disease of the right hip(s) and total hip arthroplasty is deemed medically necessary. The treatment options including medical management, injection therapy, arthroscopy and arthroplasty were discussed at length. The risks and benefits of total hip arthroplasty were presented and reviewed. The risks due to aseptic loosening, infection, stiffness, dislocation/subluxation,  thromboembolic complications and other imponderables were discussed.  The patient acknowledged the explanation, agreed to proceed with the plan and consent was signed. Patient is being admitted for inpatient treatment for surgery, pain control, PT, OT, prophylactic antibiotics, VTE prophylaxis, progressive ambulation and ADL's and discharge planning.The patient is planning to be discharged home with HEP.   Therapy Plans: HEP. Disposition: Home with wife and daughters Planned DVT Prophylaxis: aspirin 81mg BID DME needed: walker. PCP: Cleared.  TXA: IV Allergies:  - Betadine - hives Anesthesia Concerns: None.  BMI: 38.7 Last HgbA1c: 5.7 Other: - Stable aortic aneurysm.  - Asthma.  - Aspirin 81 mg baseline - Hydrocodone, zofran.  - 02/19/23: Hgb 13.9,  K+ 3.4, Cr. 0.79.     Patient's anticipated LOS is less  than 2 midnights, meeting these requirements: - Younger than 65 - Lives within 1 hour of care - Has a competent adult at home to recover with post-op recover - NO history of  - Chronic pain requiring opiods  - Diabetes  - Coronary Artery Disease  - Heart failure  - Heart attack  - Stroke  - DVT/VTE  - Cardiac arrhythmia  - Respiratory Failure/COPD  - Renal failure  - Anemia  - Advanced Liver disease   

## 2023-02-28 NOTE — Anesthesia Preprocedure Evaluation (Addendum)
Anesthesia Evaluation  Patient identified by MRN, date of birth, ID band Patient awake    Reviewed: Allergy & Precautions, NPO status , Patient's Chart, lab work & pertinent test results  Airway Mallampati: I       Dental  (+) Poor Dentition, Missing   Pulmonary asthma    Pulmonary exam normal        Cardiovascular hypertension, Pt. on medications Normal cardiovascular exam     Neuro/Psych negative neurological ROS  negative psych ROS   GI/Hepatic negative GI ROS, Neg liver ROS,,,  Endo/Other  negative endocrine ROS    Renal/GU negative Renal ROS  negative genitourinary   Musculoskeletal  (+) Arthritis , Osteoarthritis,    Abdominal  (+) + obese  Peds  Hematology   Anesthesia Other Findings IMPRESSIONS     1. Left ventricular ejection fraction, by visual estimation, is 60 to  65%. The left ventricle has normal function. Normal left ventricular size.  There is mildly increased left ventricular hypertrophy.   2. Left ventricular diastolic Doppler parameters are consistent with  impaired relaxation pattern of LV diastolic filling.   3. The aortic valve is tricuspid Aortic valve regurgitation is trivial by  color flow Doppler. Mild aortic valve sclerosis without stenosis.   4. The mitral valve is normal in structure. No evidence of mitral valve  regurgitation. No evidence of mitral stenosis.   5. There is mild dilatation of the ascending aorta measuring 41 mm.   6. Left atrial size was normal.   7. Right atrial size was normal.   8. Global right ventricle has normal systolic function.The right  ventricular size is normal. No increase in right ventricular wall  thickness.   9. The inferior vena cava is normal in size with greater than 50%  respiratory variability, suggesting right atrial pressure of 3 mmHg.  10. The tricuspid valve is normal in structure. Tricuspid valve  regurgitation was not visualized by  color flow Doppler.  11. TR signal is inadequate for assessing pulmonary artery systolic  pre    Reproductive/Obstetrics                             Anesthesia Physical Anesthesia Plan  ASA: 2  Anesthesia Plan: Spinal   Post-op Pain Management: Minimal or no pain anticipated   Induction:   PONV Risk Score and Plan: 1 and Propofol infusion, Ondansetron, Dexamethasone and Midazolam  Airway Management Planned: Natural Airway and Simple Face Mask  Additional Equipment: None  Intra-op Plan:   Post-operative Plan:   Informed Consent: I have reviewed the patients History and Physical, chart, labs and discussed the procedure including the risks, benefits and alternatives for the proposed anesthesia with the patient or authorized representative who has indicated his/her understanding and acceptance.       Plan Discussed with: CRNA  Anesthesia Plan Comments:        Anesthesia Quick Evaluation

## 2023-03-01 ENCOUNTER — Encounter (HOSPITAL_COMMUNITY)
Admission: RE | Disposition: A | Discharge status: Home / Self Care | Payer: Self-pay | Source: Ambulatory Visit | Attending: Orthopedic Surgery

## 2023-03-01 ENCOUNTER — Ambulatory Visit (HOSPITAL_COMMUNITY): Payer: Medicare HMO

## 2023-03-01 ENCOUNTER — Ambulatory Visit (HOSPITAL_BASED_OUTPATIENT_CLINIC_OR_DEPARTMENT_OTHER): Payer: Medicare HMO | Admitting: Certified Registered Nurse Anesthetist

## 2023-03-01 ENCOUNTER — Other Ambulatory Visit (HOSPITAL_COMMUNITY): Payer: Self-pay

## 2023-03-01 ENCOUNTER — Encounter (HOSPITAL_COMMUNITY): Payer: Self-pay | Admitting: Orthopedic Surgery

## 2023-03-01 ENCOUNTER — Other Ambulatory Visit: Payer: Self-pay

## 2023-03-01 ENCOUNTER — Ambulatory Visit (HOSPITAL_COMMUNITY): Payer: Medicare HMO | Admitting: Certified Registered Nurse Anesthetist

## 2023-03-01 ENCOUNTER — Ambulatory Visit (HOSPITAL_COMMUNITY)
Admission: RE | Admit: 2023-03-01 | Discharge: 2023-03-01 | Disposition: A | Discharge status: Home / Self Care | Payer: Medicare HMO | Source: Ambulatory Visit | Attending: Orthopedic Surgery | Admitting: Orthopedic Surgery

## 2023-03-01 DIAGNOSIS — J45909 Unspecified asthma, uncomplicated: Secondary | ICD-10-CM | POA: Insufficient documentation

## 2023-03-01 DIAGNOSIS — I10 Essential (primary) hypertension: Secondary | ICD-10-CM | POA: Insufficient documentation

## 2023-03-01 DIAGNOSIS — Z96641 Presence of right artificial hip joint: Secondary | ICD-10-CM

## 2023-03-01 DIAGNOSIS — Z6837 Body mass index (BMI) 37.0-37.9, adult: Secondary | ICD-10-CM | POA: Diagnosis not present

## 2023-03-01 DIAGNOSIS — M1611 Unilateral primary osteoarthritis, right hip: Secondary | ICD-10-CM | POA: Diagnosis present

## 2023-03-01 DIAGNOSIS — Z471 Aftercare following joint replacement surgery: Secondary | ICD-10-CM | POA: Diagnosis not present

## 2023-03-01 DIAGNOSIS — E669 Obesity, unspecified: Secondary | ICD-10-CM | POA: Diagnosis not present

## 2023-03-01 HISTORY — PX: TOTAL HIP ARTHROPLASTY: SHX124

## 2023-03-01 LAB — TYPE AND SCREEN
ABO/RH(D): O NEG
Antibody Screen: NEGATIVE

## 2023-03-01 SURGERY — TOTAL HIP ARTHROPLASTY ANTERIOR APPROACH
Anesthesia: Spinal | Site: Hip | Laterality: Right

## 2023-03-01 MED ORDER — ORAL CARE MOUTH RINSE: 15.0000 mL | Freq: Once | OROMUCOSAL | Status: AC

## 2023-03-01 MED ORDER — BUPIVACAINE-EPINEPHRINE 0.25% -1:200000 IJ SOLN
INTRAMUSCULAR | Status: DC | PRN
  Administered 2023-03-01: 30 mL

## 2023-03-01 MED ORDER — FENTANYL CITRATE (PF) 100 MCG/2ML IJ SOLN
INTRAMUSCULAR | Status: AC
  Filled 2023-03-01: qty 2

## 2023-03-01 MED ORDER — FENTANYL CITRATE (PF) 100 MCG/2ML IJ SOLN
INTRAMUSCULAR | Status: DC | PRN
  Administered 2023-03-01: 25 ug via INTRAVENOUS
  Administered 2023-03-01: 50 ug via INTRAVENOUS
  Administered 2023-03-01: 25 ug via INTRAVENOUS

## 2023-03-01 MED ORDER — WATER FOR IRRIGATION, STERILE IR SOLN
Status: DC | PRN
  Administered 2023-03-01: 2000 mL

## 2023-03-01 MED ORDER — LACTATED RINGERS IV BOLUS
500.0000 mL | Freq: Once | INTRAVENOUS | Status: AC
  Administered 2023-03-01: 500 mL via INTRAVENOUS

## 2023-03-01 MED ORDER — MIDAZOLAM HCL 2 MG/2ML IJ SOLN
INTRAMUSCULAR | Status: AC
  Filled 2023-03-01: qty 2

## 2023-03-01 MED ORDER — EPHEDRINE SULFATE-NACL 50-0.9 MG/10ML-% IV SOSY
PREFILLED_SYRINGE | INTRAVENOUS | Status: DC | PRN
  Administered 2023-03-01: 5 mg via INTRAVENOUS

## 2023-03-01 MED ORDER — ASPIRIN 81 MG PO CHEW
81.0000 mg | CHEWABLE_TABLET | Freq: Two times a day (BID) | ORAL | 0 refills | Status: AC
  Filled 2023-03-01: qty 90, 45d supply, fill #0

## 2023-03-01 MED ORDER — PROPOFOL 500 MG/50ML IV EMUL
INTRAVENOUS | Status: DC | PRN
  Administered 2023-03-01: 100 ug/kg/min via INTRAVENOUS

## 2023-03-01 MED ORDER — ONDANSETRON HCL 4 MG PO TABS
4.0000 mg | ORAL_TABLET | Freq: Three times a day (TID) | ORAL | 0 refills | Status: DC | PRN
  Filled 2023-03-01: qty 30, 10d supply, fill #0

## 2023-03-01 MED ORDER — HYDROCODONE-ACETAMINOPHEN 5-325 MG PO TABS
ORAL_TABLET | ORAL | Status: AC
  Filled 2023-03-01: qty 1

## 2023-03-01 MED ORDER — TRANEXAMIC ACID-NACL 1000-0.7 MG/100ML-% IV SOLN
1000.0000 mg | INTRAVENOUS | Status: AC
  Administered 2023-03-01: 1000 mg via INTRAVENOUS
  Filled 2023-03-01: qty 100

## 2023-03-01 MED ORDER — SODIUM CHLORIDE 0.9 % IR SOLN
Status: DC | PRN
  Administered 2023-03-01: 4000 mL

## 2023-03-01 MED ORDER — LACTATED RINGERS IV SOLN: INTRAVENOUS | Status: DC

## 2023-03-01 MED ORDER — KETOROLAC TROMETHAMINE 30 MG/ML IJ SOLN
INTRAMUSCULAR | Status: DC | PRN
  Administered 2023-03-01: 30 mg

## 2023-03-01 MED ORDER — POLYETHYLENE GLYCOL 3350 17 G PO PACK
17.0000 g | PACK | Freq: Every day | ORAL | 0 refills | Status: AC | PRN
  Filled 2023-03-01: qty 14, 14d supply, fill #0

## 2023-03-01 MED ORDER — SODIUM CHLORIDE (PF) 0.9 % IJ SOLN
INTRAMUSCULAR | Status: AC
  Filled 2023-03-01: qty 30

## 2023-03-01 MED ORDER — PROMETHAZINE HCL 25 MG/ML IJ SOLN: 6.2500 mg | INTRAMUSCULAR | Status: DC | PRN

## 2023-03-01 MED ORDER — PROPOFOL 10 MG/ML IV BOLUS
INTRAVENOUS | Status: DC | PRN
  Administered 2023-03-01: 20 mg via INTRAVENOUS

## 2023-03-01 MED ORDER — PROPOFOL 1000 MG/100ML IV EMUL
INTRAVENOUS | Status: AC
  Filled 2023-03-01: qty 200

## 2023-03-01 MED ORDER — ONDANSETRON HCL 4 MG/2ML IJ SOLN
INTRAMUSCULAR | Status: AC
  Filled 2023-03-01: qty 2

## 2023-03-01 MED ORDER — ONDANSETRON HCL 4 MG/2ML IJ SOLN
INTRAMUSCULAR | Status: DC | PRN
  Administered 2023-03-01: 4 mg via INTRAVENOUS

## 2023-03-01 MED ORDER — ACETAMINOPHEN 500 MG PO TABS
1000.0000 mg | ORAL_TABLET | Freq: Once | ORAL | Status: AC
  Administered 2023-03-01: 1000 mg via ORAL
  Filled 2023-03-01: qty 2

## 2023-03-01 MED ORDER — CHLORHEXIDINE GLUCONATE 0.12 % MT SOLN
15.0000 mL | Freq: Once | OROMUCOSAL | Status: AC
  Administered 2023-03-01: 15 mL via OROMUCOSAL

## 2023-03-01 MED ORDER — DEXAMETHASONE SODIUM PHOSPHATE 10 MG/ML IJ SOLN
INTRAMUSCULAR | Status: DC | PRN
  Administered 2023-03-01: 5 mg via INTRAVENOUS

## 2023-03-01 MED ORDER — MIDAZOLAM HCL 5 MG/5ML IJ SOLN
INTRAMUSCULAR | Status: DC | PRN
  Administered 2023-03-01: 2 mg via INTRAVENOUS

## 2023-03-01 MED ORDER — HYDROCODONE-ACETAMINOPHEN 5-325 MG PO TABS
1.0000 | ORAL_TABLET | ORAL | 0 refills | Status: AC | PRN
  Filled 2023-03-01: qty 42, 7d supply, fill #0

## 2023-03-01 MED ORDER — EPHEDRINE 5 MG/ML INJ
INTRAVENOUS | Status: AC
  Filled 2023-03-01: qty 5

## 2023-03-01 MED ORDER — CEFAZOLIN SODIUM-DEXTROSE 2-4 GM/100ML-% IV SOLN: 2.0000 g | Freq: Four times a day (QID) | INTRAVENOUS | Status: DC

## 2023-03-01 MED ORDER — BUPIVACAINE HCL 0.25 % IJ SOLN
INTRAMUSCULAR | Status: AC
  Filled 2023-03-01: qty 1

## 2023-03-01 MED ORDER — KETOROLAC TROMETHAMINE 30 MG/ML IJ SOLN
INTRAMUSCULAR | Status: AC
  Filled 2023-03-01: qty 1

## 2023-03-01 MED ORDER — PHENYLEPHRINE HCL-NACL 20-0.9 MG/250ML-% IV SOLN
INTRAVENOUS | Status: DC | PRN
  Administered 2023-03-01: 30 ug/min via INTRAVENOUS

## 2023-03-01 MED ORDER — HYDROMORPHONE HCL 1 MG/ML IJ SOLN: 0.2500 mg | INTRAMUSCULAR | Status: DC | PRN

## 2023-03-01 MED ORDER — SODIUM CHLORIDE (PF) 0.9 % IJ SOLN
INTRAMUSCULAR | Status: DC | PRN
  Administered 2023-03-01: 30 mL

## 2023-03-01 MED ORDER — EPINEPHRINE PF 1 MG/ML IJ SOLN
INTRAMUSCULAR | Status: AC
  Filled 2023-03-01: qty 1

## 2023-03-01 MED ORDER — DEXAMETHASONE SODIUM PHOSPHATE 10 MG/ML IJ SOLN
INTRAMUSCULAR | Status: AC
  Filled 2023-03-01: qty 1

## 2023-03-01 MED ORDER — SENNA 8.6 MG PO TABS
2.0000 | ORAL_TABLET | Freq: Every day | ORAL | 0 refills | Status: AC
  Filled 2023-03-01: qty 30, 15d supply, fill #0

## 2023-03-01 MED ORDER — MEPERIDINE HCL 50 MG/ML IJ SOLN: 6.2500 mg | INTRAMUSCULAR | Status: DC | PRN

## 2023-03-01 MED ORDER — ISOPROPYL ALCOHOL 70 % SOLN
Status: DC | PRN
  Administered 2023-03-01: 1 via TOPICAL

## 2023-03-01 MED ORDER — KETOROLAC TROMETHAMINE 15 MG/ML IJ SOLN: 15.0000 mg | Freq: Once | INTRAMUSCULAR | Status: DC

## 2023-03-01 MED ORDER — CEFAZOLIN IN SODIUM CHLORIDE 3-0.9 GM/100ML-% IV SOLN
3.0000 g | INTRAVENOUS | Status: AC
  Administered 2023-03-01: 3 g via INTRAVENOUS
  Filled 2023-03-01: qty 100

## 2023-03-01 MED ORDER — BUPIVACAINE IN DEXTROSE 0.75-8.25 % IT SOLN
INTRATHECAL | Status: DC | PRN
  Administered 2023-03-01: 2 mL via INTRATHECAL

## 2023-03-01 MED ORDER — DOCUSATE SODIUM 100 MG PO CAPS
100.0000 mg | ORAL_CAPSULE | Freq: Two times a day (BID) | ORAL | 0 refills | Status: AC
  Filled 2023-03-01: qty 60, 30d supply, fill #0

## 2023-03-01 MED ORDER — HYDROCODONE-ACETAMINOPHEN 5-325 MG PO TABS
1.0000 | ORAL_TABLET | ORAL | Status: DC | PRN
  Administered 2023-03-01: 1 via ORAL

## 2023-03-01 MED ORDER — LACTATED RINGERS IV BOLUS
250.0000 mL | Freq: Once | INTRAVENOUS | Status: AC
  Administered 2023-03-01: 250 mL via INTRAVENOUS

## 2023-03-01 SURGICAL SUPPLY — 56 items
ADH SKN CLS APL DERMABOND .7 (GAUZE/BANDAGES/DRESSINGS) ×1
APL PRP STRL LF DISP 70% ISPRP (MISCELLANEOUS) ×1
BAG COUNTER SPONGE SURGICOUNT (BAG) ×1
BAG DECANTER FOR FLEXI CONT (MISCELLANEOUS)
BAG SPEC THK2 15X12 ZIP CLS (MISCELLANEOUS) ×1
BAG SPNG CNTER NS LX DISP (BAG) ×1
BAG ZIPLOCK 12X15 (MISCELLANEOUS) ×1
CHLORAPREP W/TINT 26 (MISCELLANEOUS) ×1
COVER PERINEAL POST (MISCELLANEOUS) ×1
COVER SURGICAL LIGHT HANDLE (MISCELLANEOUS) ×1
DERMABOND ADVANCED .7 DNX12 (GAUZE/BANDAGES/DRESSINGS) ×1
DRAPE IMP U-DRAPE 54X76 (DRAPES) ×1
DRAPE SHEET LG 3/4 BI-LAMINATE (DRAPES) ×3
DRAPE STERI IOBAN 125X83 (DRAPES) ×1
DRAPE U-SHAPE 47X51 STRL (DRAPES) ×2
DRSG AQUACEL AG ADV 3.5X10 (GAUZE/BANDAGES/DRESSINGS) ×1
DRSG AQUACEL AG SP 3.5X10 (GAUZE/BANDAGES/DRESSINGS) ×1
ELECT REM PT RETURN 15FT ADLT (MISCELLANEOUS) ×1
G7 VIT E NTRL LNR 36 SZG (Miscellaneous) ×1 IMPLANT
GAUZE SPONGE 4X4 12PLY STRL (GAUZE/BANDAGES/DRESSINGS) ×1
GLOVE BIO SURGEON STRL SZ7 (GLOVE) ×1
GLOVE BIO SURGEON STRL SZ8.5 (GLOVE) ×2
GLOVE BIOGEL PI IND STRL 7.5 (GLOVE) ×1
GLOVE BIOGEL PI IND STRL 8.5 (GLOVE) ×1
GOWN SPEC L3 XXLG W/TWL (GOWN DISPOSABLE) ×1
GOWN STRL REUS W/ TWL XL LVL3 (GOWN DISPOSABLE) ×1
GOWN STRL REUS W/TWL XL LVL3 (GOWN DISPOSABLE) ×1
HANDPIECE INTERPULSE COAX TIP (DISPOSABLE) ×1
HEAD CERAMIC BIOLOX 36 T1 STD (Head) ×1 IMPLANT
HOLDER FOLEY CATH W/STRAP (MISCELLANEOUS) ×1
HOOD PEEL AWAY T7 (MISCELLANEOUS) ×3
KIT TURNOVER KIT A (KITS) ×1
MANIFOLD NEPTUNE II (INSTRUMENTS) ×1
MARKER SKIN DUAL TIP RULER LAB (MISCELLANEOUS) ×1
NDL SAFETY ECLIP 18X1.5 (MISCELLANEOUS) ×1
NDL SPNL 18GX3.5 QUINCKE PK (NEEDLE) ×1 IMPLANT
NEEDLE SPNL 18GX3.5 QUINCKE PK (NEEDLE) ×1
PACK ANTERIOR HIP CUSTOM (KITS) ×1
PENCIL SMOKE EVACUATOR (MISCELLANEOUS)
SAW OSC TIP CART 19.5X105X1.3 (SAW) ×2
SEALER BIPOLAR AQUA 6.0 (INSTRUMENTS) ×1
SET HNDPC FAN SPRY TIP SCT (DISPOSABLE) ×1
SHELL ACET G7 4H 60 SZG (Shell) ×1 IMPLANT
SOLUTION PRONTOSAN WOUND 350ML (IRRIGATION / IRRIGATOR) ×1
SPIKE FLUID TRANSFER (MISCELLANEOUS) ×1
STEM FEM CL 119X39.6 SZ17 133D (Stem) ×1 IMPLANT
SUT MNCRL AB 3-0 PS2 18 (SUTURE) ×1
SUT MON AB 2-0 CT1 36 (SUTURE)
SUT STRATAFIX PDO 1 14 VIOLET (SUTURE) ×1
SUT STRATFX PDO 1 14 VIOLET (SUTURE) ×1
SUT VIC AB 2-0 CT1 27 (SUTURE) ×1
SUT VIC AB 2-0 CT1 TAPERPNT 27 (SUTURE) ×1
SYR 3ML LL SCALE MARK (SYRINGE) ×1
TRAY FOLEY MTR SLVR 16FR STAT (SET/KITS/TRAYS/PACK) ×1
TUBE SUCTION HIGH CAP CLEAR NV (SUCTIONS) ×1
WATER STERILE IRR 1000ML POUR (IV SOLUTION) ×1

## 2023-03-01 NOTE — Interval H&P Note (Signed)
History and Physical Interval Note:  03/01/2023 7:34 AM  Ronald Harding  has presented today for surgery, with the diagnosis of Right hip osteoarthritis.  The various methods of treatment have been discussed with the patient and family. After consideration of risks, benefits and other options for treatment, the patient has consented to  Procedure(s) with comments: TOTAL HIP ARTHROPLASTY ANTERIOR APPROACH (Right) - 150 as a surgical intervention.  The patient's history has been reviewed, patient examined, no change in status, stable for surgery.  I have reviewed the patient's chart and labs.  Questions were answered to the patient's satisfaction.     Iline Oven Darilyn Storbeck

## 2023-03-01 NOTE — Care Plan (Signed)
Ortho Bundle Case Management Note  Patient Details  Name: Ronald Harding MRN: [id number] Date of Birth: October 30, 1955                  R THA on 03-01-23  DCP: Home with wife and dtrs  DME: RW ordered through [employer]  PT: HE   DME Arranged:  Walker rolling DME Agency:  [employer]    Additional Comments: Please contact me with any questions of if this plan should need to change.   Ennis Forts, RN,CCM [facility]  [phone] 03/01/2023, 8:51 AM

## 2023-03-01 NOTE — Transfer of Care (Signed)
Immediate Anesthesia Transfer of Care Note  Patient: Ronald Harding  Procedure(s) Performed: TOTAL HIP ARTHROPLASTY ANTERIOR APPROACH (Right: Hip)  Patient Location: PACU  Anesthesia Type:Spinal  Level of Consciousness: awake and patient cooperative  Airway & Oxygen Therapy: Patient Spontanous Breathing and Patient connected to face mask  Post-op Assessment: Report given to RN and Post -op Vital signs reviewed and stable  Post vital signs: Reviewed and stable  Last Vitals:  Vitals Value Taken Time  BP 123/85 03/01/23 1042  Temp    Pulse 67 03/01/23 1045  Resp 21 03/01/23 1044  SpO2 96 % 03/01/23 1045  Vitals shown include unvalidated device data.  Last Pain:  Vitals:   03/01/23 0614  TempSrc:   PainSc: 0-No pain         Complications: No notable events documented.

## 2023-03-01 NOTE — Anesthesia Postprocedure Evaluation (Signed)
Anesthesia Post Note  Patient: Ronald Harding  Procedure(s) Performed: TOTAL HIP ARTHROPLASTY ANTERIOR APPROACH (Right: Hip)     Patient location during evaluation: Phase II Anesthesia Type: Spinal Level of consciousness: awake Pain management: pain level controlled Vital Signs Assessment: post-procedure vital signs reviewed and stable Respiratory status: spontaneous breathing Cardiovascular status: stable Postop Assessment: no headache, no backache, spinal receding, patient able to bend at knees and no apparent nausea or vomiting Anesthetic complications: no  No notable events documented.  Last Vitals:  Vitals:   03/01/23 1200 03/01/23 1218  BP: 138/81 136/82  Pulse: (!) 56 (!) 58  Resp: 16   Temp: 36.8 C   SpO2: 98% 98%    Last Pain:  Vitals:   03/01/23 1218  TempSrc:   PainSc: 0-No pain                 Caren Macadam

## 2023-03-01 NOTE — Anesthesia Procedure Notes (Signed)
Spinal  Patient location during procedure: OR Start time: 03/01/2023 7:47 AM End time: 03/01/2023 7:52 AM Reason for block: surgical anesthesia Staffing Performed: anesthesiologist  Anesthesiologist: Leilani Able, MD Performed by: Leilani Able, MD Authorized by: Leilani Able, MD   Preanesthetic Checklist Completed: patient identified, IV checked, site marked, risks and benefits discussed, surgical consent, monitors and equipment checked, pre-op evaluation and timeout performed Spinal Block Patient position: sitting Prep: DuraPrep and site prepped and draped Patient monitoring: continuous pulse ox and blood pressure Approach: midline Location: L3-4 Injection technique: single-shot Needle Needle type: Pencan  Needle gauge: 24 G Needle length: 10 cm Needle insertion depth: 6 cm Assessment Sensory level: T6 Events: CSF return

## 2023-03-01 NOTE — Evaluation (Signed)
Physical Therapy Evaluation Patient Details Name: Ronald Harding MRN: [id number] DOB: [birth year] Today's Date: 03/01/2023  History of Present Illness  67 yo male, S/P RTHAaaaaa, direct anterior  Clinical Impression  The patient is noted to have a  slight decrease in control of the right leg during swing, no buckling noted  . Patient practiced steps and has reached  PT goals for DC.     Recommendations for follow up therapy are one component of a multi-disciplinary discharge planning process, led by the attending physician.  Recommendations may be updated based on patient status, additional functional criteria and insurance authorization.  Follow Up Recommendations       Assistance Recommended at Discharge Set up Supervision/Assistance  Patient can return home with the following  A little help with walking and/or transfers;A little help with bathing/dressing/bathroom;Help with stairs or ramp for entrance;Assist for transportation    Equipment Recommendations Rolling walker (2 wheels)  Recommendations for Other Services       Functional Status Assessment Patient has had a recent decline in their functional status and demonstrates the ability to make significant improvements in function in a reasonable and predictable amount of time.     Precautions / Restrictions Precautions Precautions: Fall Restrictions Weight Bearing Restrictions: No      Mobility  Bed Mobility Overal bed mobility: Modified Independent                  Transfers Overall transfer level: Needs assistance Equipment used: Rolling walker (2 wheels) Transfers: Sit to/from Stand Sit to Stand: Min assist           General transfer comment: noted mildly unsteady to stnad from bed, R leg a bit  wobbly    Ambulation/Gait Ambulation/Gait assistance: Min assist, Min guard Gait Distance (Feet): 120 Feet Assistive device: Rolling walker (2 wheels) Gait Pattern/deviations: Step-to pattern,  Step-through pattern       General Gait Details: cues for safety and to always have RW, Right leg tends to  swing in  Stairs Stairs: Yes Stairs assistance: Min assist Stair Management: Two rails, Step to pattern Number of Stairs: 2 General stair comments: daughter present , cues for sequence  Wheelchair Mobility    Modified Rankin (Stroke Patients Only)       Balance Overall balance assessment: Mild deficits observed, not formally tested                                           Pertinent Vitals/Pain Pain Assessment Pain Assessment: Faces Faces Pain Scale: Hurts a little bit Pain Location: right groin Pain Descriptors / Indicators: Discomfort Pain Intervention(s): Monitored during session, Premedicated before session    Home Living Family/patient expects to be discharged to:: Private residence Living Arrangements: Spouse/significant other;Children Available Help at Discharge: Family;Available 24 hours/day Type of Home: House Home Access: Stairs to enter Entrance Stairs-Rails: Doctor, general practice of Steps: 5   Home Layout: One level Home Equipment: Cane - single point      Prior Function Prior Level of Function : Independent/Modified Independent;Working/employed;Driving                     Hand Dominance        Extremity/Trunk Assessment   Upper Extremity Assessment Upper Extremity Assessment: Overall WFL for tasks assessed    Lower Extremity Assessment Lower Extremity Assessment: RLE deficits/detail RLE Deficits / Details:  able to flex hip in supine, advance to step    Cervical / Trunk Assessment Cervical / Trunk Assessment: Normal  Communication   Communication: No difficulties  Cognition Arousal/Alertness: Awake/alert Behavior During Therapy: WFL for tasks assessed/performed Overall Cognitive Status: Within Functional Limits for tasks assessed                                           General Comments      Exercises     Assessment/Plan    PT Assessment All further PT needs can be met in the next venue of care  PT Problem List Decreased strength;Decreased mobility;Decreased safety awareness;Decreased range of motion;Decreased activity tolerance       PT Treatment Interventions      PT Goals (Current goals can be found in the Care Plan section)  Acute Rehab PT Goals Patient Stated Goal: go home PT Goal Formulation: All assessment and education complete, DC therapy    Frequency       Co-evaluation               AM-PAC PT "6 Clicks" Mobility  Outcome Measure Help needed turning from your back to your side while in a flat bed without using bedrails?: None Help needed moving from lying on your back to sitting on the side of a flat bed without using bedrails?: None Help needed moving to and from a bed to a chair (including a wheelchair)?: A Little Help needed standing up from a chair using your arms (e.g., wheelchair or bedside chair)?: A Little Help needed to walk in hospital room?: A Little Help needed climbing 3-5 steps with a railing? : A Little 6 Click Score: 20    End of Session Equipment Utilized During Treatment: Gait belt Activity Tolerance: Patient tolerated treatment well Patient left: in bed;with family/visitor present Nurse Communication: Mobility status PT Visit Diagnosis: Unsteadiness on feet (R26.81)    Time: 1610-9604 PT Time Calculation (min) (ACUTE ONLY): 20 min   Charges:   PT Evaluation $PT Eval Low Complexity: 1 Low          Blanchard Kelch PT Acute Rehabilitation Services Office [phone] Weekend pager-[phone]   Rada Hay 03/01/2023, 3:50 PM

## 2023-03-01 NOTE — Op Note (Signed)
OPERATIVE REPORT  SURGEON: Samson Frederic, MD   ASSISTANT: Clint Bolder, PA-C.  PREOPERATIVE DIAGNOSIS: Right hip arthritis.   POSTOPERATIVE DIAGNOSIS: Right hip arthritis.   PROCEDURE: Right total hip arthroplasty, anterior approach.   IMPLANTS: Biomet Taperloc Complete Microplasty stem, size 17 x 119 mm, high offset. Biomet G7 OsseoTi Cup, size 60 mm. Biomet Vivacit-E liner, size 36 mm, G, neutral. Biomet Biolox ceramic head ball, size 36 + 0 mm.  ANESTHESIA:  MAC, Regional, and Spinal  ESTIMATED BLOOD LOSS:-600 mL    ANTIBIOTICS: 3 g Ancef.  DRAINS: None.  COMPLICATIONS: None.   CONDITION: PACU - hemodynamically stable.   BRIEF CLINICAL NOTE: Ronald Harding is a 67 y.o. male with a long-standing history of Right hip arthritis. After failing conservative management, the patient was indicated for total hip arthroplasty. The risks, benefits, and alternatives to the procedure were explained, and the patient elected to proceed.  PROCEDURE IN DETAIL: Surgical site was marked by myself in the pre-op holding area. Once inside the operating room, spinal anesthesia was obtained, and a foley catheter was inserted. The patient was then positioned on the Hana table.  All bony prominences were well padded.  The hip was prepped and draped in the normal sterile surgical fashion.  A time-out was called verifying side and site of surgery. The patient received IV antibiotics within 60 minutes of beginning the procedure.   Bikini incision was made, and superficial dissection was performed lateral to the ASIS. The direct anterior approach to the hip was performed through the Hueter interval.  Lateral femoral circumflex vessels were treated with the Auqumantys. The anterior capsule was exposed and an inverted T capsulotomy was made. The femoral neck cut was made to the level of the templated cut.  A corkscrew was placed into the head and the head was removed.  The femoral head was found to have  eburnated bone. The head was passed to the back table and was measured. Pubofemoral ligament was released off of the calcar, taking care to stay on bone. Superior capsule was released from the greater trochanter, taking care to stay lateral to the posterior border of the femoral neck in order to preserve the short external rotators.   Acetabular exposure was achieved, and the pulvinar and labrum were excised. Sequential reaming of the acetabulum was then performed up to a size 59 mm reamer. A 60 mm cup was then opened and impacted into place at approximately 40 degrees of abduction and 20 degrees of anteversion. The final polyethylene liner was impacted into place and acetabular osteophytes were removed.    I then gained femoral exposure taking care to protect the abductors and greater trochanter.  This was performed using standard external rotation, extension, and adduction.  A cookie cutter was used to enter the femoral canal, and then the femoral canal finder was placed.  Sequential broaching was performed up to a size 17.  Calcar planer was used on the femoral neck remnant.  I placed a high offset neck and a trial head ball.  The hip was reduced.  Leg lengths and offset were checked fluoroscopically.  The hip was dislocated and trial components were removed.  The final implants were placed, and the hip was reduced.  Fluoroscopy was used to confirm component position and leg lengths.  At 90 degrees of external rotation and full extension, the hip was stable to an anterior directed force.   The wound was copiously irrigated with Prontosan solution and normal saline using pulse  lavage.  Marcaine solution was injected into the periarticular soft tissue.  The wound was closed in layers using #1 Vicryl and V-Loc for the fascia, 2-0 Vicryl for the subcutaneous fat, 2-0 Monocryl for the deep dermal layer, 3-0 running Monocryl subcuticular stitch, and Dermabond for the skin.  Once the glue was fully dried, an  Aquacell Ag dressing was applied.  The patient was transported to the recovery room in stable condition.  Sponge, needle, and instrument counts were correct at the end of the case x2.  The patient tolerated the procedure well and there were no known complications.  The aquamantis was utilized for this case to help facilitate better hemostasis as patient was felt to be at increased risk of bleeding because of obesity.  A oscillating saw tip was utilized for this case to prevent damage to the soft tissue structures such as muscles, ligaments and tendons, and to ensure accurate bone cuts. This patient was at increased risk for above structures due to  minimally invasive approach.  Please note that a surgical assistant was a medical necessity for this procedure to perform it in a safe and expeditious manner. Assistant was necessary to provide appropriate retraction of vital neurovascular structures, to prevent femoral fracture, and to allow for anatomic placement of the prosthesis.

## 2023-03-01 NOTE — Anesthesia Procedure Notes (Signed)
Procedure Name: MAC Date/Time: 03/01/2023 7:45 AM  Performed by: Vanessa Goofy Ridge, CRNAPre-anesthesia Checklist: Patient identified, Emergency Drugs available, Suction available and Patient being monitored Patient Re-evaluated:Patient Re-evaluated prior to induction Oxygen Delivery Method: Simple face mask

## 2023-03-01 NOTE — Discharge Instructions (Addendum)
? ?Dr. Brian Swinteck ?Joint Replacement Specialist ?[facility] ?[address] ?[place], [place] [address] ?[phone] ? ? ?TOTAL HIP REPLACEMENT POSTOPERATIVE DIRECTIONS ? ? ? ?Hip Rehabilitation, Guidelines Following Surgery  ? ?WEIGHT BEARING ?Weight bearing as tolerated with assist device (walker, cane, etc) as directed, use it as long as suggested by your surgeon or therapist, typically at least 4-6 weeks. ? ?The results of a hip operation are greatly improved after range of motion and muscle strengthening exercises. Follow all safety measures which are given to protect your hip. If any of these exercises cause increased pain or swelling in your joint, decrease the amount until you are comfortable again. Then slowly increase the exercises. Call your caregiver if you have problems or questions.  ? ?HOME CARE INSTRUCTIONS  ?Most of the following instructions are designed to prevent the dislocation of your new hip.  ?Remove items at home which could result in a fall. This includes throw rugs or furniture in walking pathways.  ?Continue medications as instructed at time of discharge. ?You may have some home medications which will be placed on hold until you complete the course of blood thinner medication. ?You may start showering once you are discharged home. Do not remove your dressing. ?Do not put on socks or shoes without following the instructions of your caregivers.   ?Sit on chairs with arms. Use the chair arms to help push yourself up when arising.  ?Arrange for the use of a toilet seat elevator so you are not sitting low.  ?Walk with walker as instructed.  ?You may resume a sexual relationship in one month or when given the OK by your caregiver.  ?Use walker as long as suggested by your caregivers.  ?You may put full weight on your legs and walk as much as is comfortable. ?Avoid periods of inactivity such as sitting longer than an hour when not asleep. This helps prevent blood  clots.  ?You may return to work once you are cleared by your surgeon.  ?Do not drive a car for 6 weeks or until released by your surgeon.  ?Do not drive while taking narcotics.  ?Wear elastic stockings for two weeks following surgery during the day but you may remove then at night.  ?Make sure you keep all of your appointments after your operation with all of your doctors and caregivers. You should call the office at the above phone number and make an appointment for approximately two weeks after the date of your surgery. ?Please pick up a stool softener and laxative for home use as long as you are requiring pain medications. ?ICE to the affected hip every three hours for 30 minutes at a time and then as needed for pain and swelling. Continue to use ice on the hip for pain and swelling from surgery. You may notice swelling that will progress down to the foot and ankle.  This is normal after surgery.  Elevate the leg when you are not up walking on it.   ?It is important for you to complete the blood thinner medication as prescribed by your doctor. ?Continue to use the breathing machine which will help keep your temperature down.  It is common for your temperature to cycle up and down following surgery, especially at night when you are not up moving around and exerting yourself.  The breathing machine keeps your lungs expanded and your temperature down. ? ?RANGE OF MOTION AND STRENGTHENING EXERCISES  ?These exercises are designed to help you   keep full movement of your hip joint. Follow your caregiver's or physical therapist's instructions. Perform all exercises about fifteen times, three times per day or as directed. Exercise both hips, even if you have had only one joint replacement. These exercises can be done on a training (exercise) mat, on the floor, on a table or on a bed. Use whatever works the best and is most comfortable for you. Use music or television while you are exercising so that the exercises are a  pleasant break in your day. This will make your life better with the exercises acting as a break in routine you can look forward to.  ?Lying on your back, slowly slide your foot toward your buttocks, raising your knee up off the floor. Then slowly slide your foot back down until your leg is straight again.  ?Lying on your back spread your legs as far apart as you can without causing discomfort.  ?Lying on your side, raise your upper leg and foot straight up from the floor as far as is comfortable. Slowly lower the leg and repeat.  ?Lying on your back, tighten up the muscle in the front of your thigh (quadriceps muscles). You can do this by keeping your leg straight and trying to raise your heel off the floor. This helps strengthen the largest muscle supporting your knee.  ?Lying on your back, tighten up the muscles of your buttocks both with the legs straight and with the knee bent at a comfortable angle while keeping your heel on the floor.  ? ?SKILLED REHAB INSTRUCTIONS: ?If the patient is transferred to a skilled rehab facility following release from the hospital, a list of the current medications will be sent to the facility for the patient to continue.  When discharged from the skilled rehab facility, please have the facility set up the patient's Home Health Physical Therapy prior to being released. Also, the skilled facility will be responsible for providing the patient with their medications at time of release from the facility to include their pain medication and their blood thinner medication. If the patient is still at the rehab facility at time of the two week follow up appointment, the skilled rehab facility will also need to assist the patient in arranging follow up appointment in our office and any transportation needs. ? ?POST-OPERATIVE OPIOID TAPER INSTRUCTIONS: ?It is important to wean off of your opioid medication as soon as possible. If you do not need pain medication after your surgery it is ok  to stop day one. ?Opioids include: ?Codeine, Hydrocodone(Norco, Vicodin), Oxycodone(Percocet, oxycontin) and hydromorphone amongst others.  ?Long term and even short term use of opiods can cause: ?Increased pain response ?Dependence ?Constipation ?Depression ?Respiratory depression ?And more.  ?Withdrawal symptoms can include ?Flu like symptoms ?Nausea, vomiting ?And more ?Techniques to manage these symptoms ?Hydrate well ?Eat regular healthy meals ?Stay active ?Use relaxation techniques(deep breathing, meditating, yoga) ?Do Not substitute Alcohol to help with tapering ?If you have been on opioids for less than two weeks and do not have pain than it is ok to stop all together.  ?Plan to wean off of opioids ?This plan should start within one week post op of your joint replacement. ?Maintain the same interval or time between taking each dose and first decrease the dose.  ?Cut the total daily intake of opioids by one tablet each day ?Next start to increase the time between doses. ?The last dose that should be eliminated is the evening dose.  ? ? ?MAKE   SURE YOU:  ?Understand these instructions.  ?Will watch your condition.  ?Will get help right away if you are not doing well or get worse. ? ?Pick up stool softner and laxative for home use following surgery while on pain medications. ?Do not remove your dressing. ?The dressing is waterproof--it is OK to take showers. ?Continue to use ice for pain and swelling after surgery. ?Do not use any lotions or creams on the incision until instructed by your surgeon. ?Total Hip Protocol. ? ?

## 2023-03-02 ENCOUNTER — Encounter (HOSPITAL_COMMUNITY): Payer: Self-pay | Admitting: Orthopedic Surgery

## 2023-03-20 DIAGNOSIS — Z96641 Presence of right artificial hip joint: Secondary | ICD-10-CM | POA: Diagnosis not present

## 2023-03-20 DIAGNOSIS — Z471 Aftercare following joint replacement surgery: Secondary | ICD-10-CM | POA: Diagnosis not present

## 2023-04-13 DIAGNOSIS — Z96641 Presence of right artificial hip joint: Secondary | ICD-10-CM | POA: Diagnosis not present

## 2023-05-14 ENCOUNTER — Telehealth: Payer: Self-pay | Admitting: *Deleted

## 2023-05-14 NOTE — Telephone Encounter (Signed)
I connected with Ronald Harding on 7/22 at 1032 by telephone and verified that I am speaking with the correct person using two identifiers. According to the patient's chart they are due for annual physical with [facility]. Pt scheduled. There are no transportation issues at this time. Nothing further was needed at the end of our conversation.

## 2023-05-14 NOTE — Telephone Encounter (Signed)
I attempted to contact patient by telephone but was unsuccessful. According to the patient's chart they are due for follow up with  [facility]. I have left a HIPAA compliant message advising the patient to contact [facility] at [phone]. I will continue to follow up with the patient to make sure this appointment is scheduled.

## 2023-08-01 ENCOUNTER — Other Ambulatory Visit: Payer: Self-pay | Admitting: Cardiothoracic Surgery

## 2023-08-01 DIAGNOSIS — I7121 Aneurysm of the ascending aorta, without rupture: Secondary | ICD-10-CM

## 2023-08-08 ENCOUNTER — Ambulatory Visit (INDEPENDENT_AMBULATORY_CARE_PROVIDER_SITE_OTHER): Payer: Medicare HMO

## 2023-08-08 DIAGNOSIS — Z23 Encounter for immunization: Secondary | ICD-10-CM | POA: Diagnosis not present

## 2023-08-08 NOTE — Progress Notes (Signed)
Patient presented in office today for His HD Flu Vaccine. HD Flu Vaccine was administered into his Right Deltoid Muscle. Patient tolerated injection well and his injection site looked fine. Patient advised to report to the office immediately if he noticed any adverse reactions.

## 2023-08-09 ENCOUNTER — Encounter: Payer: Self-pay | Admitting: Cardiothoracic Surgery

## 2023-09-04 ENCOUNTER — Ambulatory Visit
Admission: RE | Admit: 2023-09-04 | Discharge: 2023-09-04 | Disposition: A | Discharge status: Home / Self Care | Payer: Medicare HMO | Source: Ambulatory Visit | Attending: Cardiothoracic Surgery | Admitting: Cardiothoracic Surgery

## 2023-09-04 DIAGNOSIS — I7121 Aneurysm of the ascending aorta, without rupture: Secondary | ICD-10-CM

## 2023-09-10 ENCOUNTER — Ambulatory Visit: Payer: Medicare HMO | Admitting: Cardiothoracic Surgery

## 2023-09-10 ENCOUNTER — Encounter: Payer: Self-pay | Admitting: Cardiothoracic Surgery

## 2023-09-10 VITALS — BP 145/77 | HR 82 | Resp 20 | Ht 72.0 in | Wt 300.0 lb

## 2023-09-10 DIAGNOSIS — I7121 Aneurysm of the ascending aorta, without rupture: Secondary | ICD-10-CM

## 2023-09-10 NOTE — Progress Notes (Signed)
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 Patient was last seen 18 months ago.  He has developed no symptoms of chest pain.  He did undergo a right hip replacement about 6 months ago without cardiac problems. He is compliant with his blood pressure medication (losartan and blood pressure has been well-controlled.  I reviewed the images of his CT scan performed earlier this month.  There is been no change in the ascending thoracic aortic diameter stable at 4.5 cm.  Current Outpatient Medications  Medication Sig Dispense Refill   albuterol (PROVENTIL HFA;VENTOLIN HFA) 108 (90 Base) MCG/ACT inhaler Inhale 2 puffs into the lungs every 6 (six) hours as needed for wheezing or shortness of breath. 1 Inhaler 2   aspirin EC 81 MG tablet Take 81 mg by mouth daily. Swallow whole.     losartan-hydrochlorothiazide (HYZAAR) 50-12.5 MG tablet TAKE ONE TABLET BY MOUTH ONE TIME DAILY 90 tablet 3   ondansetron (ZOFRAN) 4 MG tablet Take 1 tablet (4 mg total) by mouth every 8 (eight) hours as needed for nausea or vomiting. (Patient not taking: Reported on 09/10/2023) 30 tablet 0   No current facility-administered medications for this visit.     Physical Exam: Blood pressure (!) 145/77, pulse 82, resp. rate 20, height 6' (1.829 m), weight 300 lb (136.1 kg), SpO2 96%.        Exam    General- alert and comfortable    Neck- no JVD, no cervical adenopathy palpable, no carotid bruit   Lungs- clear without rales, wheezes   Cor- regular rate and rhythm, soft 1/6 aortic systolic murmu, no gallop    Abdomen- soft, non-tender   Extremities - warm, non-tender, minimal edema   Neuro- oriented, appropriate, no focal weakness  Diagnostic Tests: I personally reviewed the images of the CT scan performed earlier this month and  discussed the results with the patient. He has a stable ascending fusiform aneurysm measuring at 4.5 cm diameter.  No other significant problems noted. Impression: Stable asymptomatic fusiform aneurysm.Marland Kitchen  He does not meet criteria for surgery at the current size of the aorta. Best therapy is good blood pressure control to keep systolic blood pressure less than 140 mm Hg and serial scanning. Will plan on bringing the patient back in 18 months for follow-up CT scan of the chest. He understands the importance of heart healthy diet to lose weight, exercise/walk 20 to 30 minutes daily, and compliance with cardiac medication.  Plan: Return for CT scan in 18 months.   Lovett Sox, MD [facility] [phone]

## 2023-09-24 DIAGNOSIS — Z96641 Presence of right artificial hip joint: Secondary | ICD-10-CM | POA: Diagnosis not present

## 2023-09-24 DIAGNOSIS — Z09 Encounter for follow-up examination after completed treatment for conditions other than malignant neoplasm: Secondary | ICD-10-CM | POA: Diagnosis not present

## 2023-10-01 ENCOUNTER — Encounter: Payer: Medicare HMO | Admitting: Internal Medicine

## 2023-10-08 ENCOUNTER — Ambulatory Visit (INDEPENDENT_AMBULATORY_CARE_PROVIDER_SITE_OTHER): Payer: Medicare HMO

## 2023-10-08 VITALS — Ht 72.0 in | Wt 298.0 lb

## 2023-10-08 DIAGNOSIS — Z0000 Encounter for general adult medical examination without abnormal findings: Secondary | ICD-10-CM

## 2023-10-08 NOTE — Patient Instructions (Signed)
Ronald Harding , Thank you for taking time to come for your Medicare Wellness Visit. I appreciate your ongoing commitment to your health goals. Please review the following plan we discussed and let me know if I can assist you in the future.   Referrals/Orders/Follow-Ups/Clinician Recommendations: [name].  This is a list of the screening recommended for you and due dates:  Health Maintenance  Topic Date Due   COVID-19 Vaccine (5 - 2024-25 season) 10/24/2023*   Hepatitis C Screening  10/26/2023*   Pneumonia Vaccine (1 of 2 - PCV) 10/07/2024*   Medicare Annual Wellness Visit  10/07/2024   Colon Cancer Screening  04/23/2031   Flu Shot  Completed   Zoster (Shingles) Vaccine  Completed   HPV Vaccine  Aged Out   DTaP/Tdap/Td vaccine  Discontinued  *Topic was postponed. The date shown is not the original due date.    Advanced directives: (Copy Requested) Please bring a copy of your health care power of attorney and living will to the office to be added to your chart at your convenience.  Next Medicare Annual Wellness Visit scheduled for next year: Yes

## 2023-10-08 NOTE — Progress Notes (Signed)
Subjective:   Ronald Harding is a 67 y.o. male who presents for Medicare Annual/Subsequent preventive examination.  Visit Complete: Virtual I connected with  [name] Query on 10/08/23 by a audio enabled telemedicine application and verified that I am speaking with the correct person using two identifiers.  Patient Location: Home  Provider Location: Home Office  I discussed the limitations of evaluation and management by telemedicine. The patient expressed understanding and agreed to proceed.  Vital Signs: Because this visit was a virtual/telehealth visit, some criteria may be missing or patient reported. Any vitals not documented were not able to be obtained and vitals that have been documented are patient reported.   Cardiac Risk Factors include: advanced age (>6men, >31 women);hypertension;Other (see comment), Risk factor comments: Thoracic aoritic aneurysm     Objective:    Today's Vitals   10/08/23 1123  Weight: 298 lb (135.2 kg)  Height: 6' (1.829 m)   Body mass index is 40.42 kg/m.     10/08/2023   11:29 AM 02/19/2023   11:26 AM 06/30/2022    9:55 AM 01/17/2019    5:55 PM 09/17/2015    1:09 PM  Advanced Directives  Does Patient Have a Medical Advance Directive? No No No No No  Would patient like information on creating a medical advance directive?   Yes (MAU/Ambulatory/Procedural Areas - Information given) No - Patient declined No - patient declined information    Current Medications (verified) Outpatient Encounter Medications as of 10/08/2023  Medication Sig   albuterol (PROVENTIL HFA;VENTOLIN HFA) 108 (90 Base) MCG/ACT inhaler Inhale 2 puffs into the lungs every 6 (six) hours as needed for wheezing or shortness of breath.   aspirin EC 81 MG tablet Take 81 mg by mouth daily. Swallow whole.   losartan-hydrochlorothiazide (HYZAAR) 50-12.5 MG tablet TAKE ONE TABLET BY MOUTH ONE TIME DAILY   No facility-administered encounter medications on file as of 10/08/2023.     Allergies (verified) Betadine [povidone iodine]   History: Past Medical History:  Diagnosis Date   Aortic aneurysm (HCC) 2019   4.6 cm ascending thoracic aortic aneurysm   Arthritis    Asthma    hx of-as a child   History of colon polyps    benign   Hypertension    takes Hyzaar daily   Joint pain    Pneumonia    hx of-in the 70's   Pre-diabetes    SBO (small bowel obstruction) (HCC)    with NG tube placement   Past Surgical History:  Procedure Laterality Date   ANKLE SURGERY Right 10/23/2010   COLONOSCOPY     MULTIPLE EXTRACTIONS WITH ALVEOLOPLASTY N/A 09/23/2015   Procedure: Extraction of tooth #'s 1-12,14- 28, 30-32 with alveoloplasty, bilateral mandibular tori reductions and reduction of lateral exostoses.;  Surgeon: Charlynne Pander, DDS;  Location: [facility] OR;  Service: Oral Surgery;  Laterality: N/A;   NOSE SURGERY  10/24/2011   POLYPECTOMY     TOTAL HIP ARTHROPLASTY Right 03/01/2023   Procedure: TOTAL HIP ARTHROPLASTY ANTERIOR APPROACH;  Surgeon: Samson Frederic, MD;  Location: [facility];  Service: Orthopedics;  Laterality: Right;  150   Family History  Problem Relation Age of Onset   Hypertension Mother    Arthritis Mother    Hypertension Father    Heart disease Father    Arthritis Father    Rectal cancer Other    Stomach cancer Other    Esophageal cancer Other    Colon cancer Other    Colon polyps  Other    Social History   Socioeconomic History   Marital status: Married    Spouse name: Ronald Harding   Number of children: 2   Years of education: Not on file   Highest education level: Not on file  Occupational History   Occupation: Midwife  Tobacco Use   Smoking status: Never   Smokeless tobacco: Former    Types: Chew   Tobacco comments:    one pack of chew per week  Vaping Use   Vaping status: Never Used  Substance and Sexual Activity   Alcohol use: No    Alcohol/week: 0.0 standard drinks of alcohol   Drug use: No   Sexual activity: Not on file   Other Topics Concern   Not on file  Social History Narrative   Married. 2 children. Disabled. Lives with wife and one daughter   Social Drivers of Health   Financial Resource Strain: Low Risk  (10/08/2023)   Overall Financial Resource Strain (CARDIA)    Difficulty of Paying Living Expenses: Not hard at all  Food Insecurity: No Food Insecurity (10/08/2023)   Hunger Vital Sign    Worried About Running Out of Food in the Last Year: Never true    Ran Out of Food in the Last Year: Never true  Transportation Needs: No Transportation Needs (10/08/2023)   PRAPARE - Administrator, Civil Service (Medical): No    Lack of Transportation (Non-Medical): No  Physical Activity: Inactive (10/08/2023)   Exercise Vital Sign    Days of Exercise per Week: 0 days    Minutes of Exercise per Session: 0 min  Stress: No Stress Concern Present (10/08/2023)   [employer] of Occupational Health - Occupational Stress Questionnaire    Feeling of Stress : Not at all  Social Connections: Not on file    Tobacco Counseling Counseling given: Not Answered Tobacco comments: one pack of chew per week   Clinical Intake:  Pre-visit preparation completed: Yes  Pain : No/denies pain     BMI - recorded: 40.42 Nutritional Status: BMI > 30  Obese Nutritional Risks: None Diabetes: No  How often do you need to have someone help you when you read instructions, pamphlets, or other written materials from your doctor or pharmacy?: 1 - Never  Interpreter Needed?: No  Information entered by :: Carrel Leather, RMA   Activities of Daily Living    10/08/2023   11:25 AM 02/19/2023   11:28 AM  In your present state of health, do you have any difficulty performing the following activities:  Hearing? 0   Vision? 0   Difficulty concentrating or making decisions? 0   Walking or climbing stairs? 0   Dressing or bathing? 0   Doing errands, shopping? 0 0  Preparing Food and eating ? N   Using the  Toilet? N   In the past six months, have you accidently leaked urine? N   Do you have problems with loss of bowel control? N   Managing your Medications? N   Managing your Finances? N   Housekeeping or managing your Housekeeping? N     Patient Care Team: Myrlene Broker, MD as PCP - Harding (Internal Medicine) Luciana Axe Alford Highland, MD as Consulting Physician (Ophthalmology)  Indicate any recent Medical Services you may have received from other than [facility] providers in the past year (date may be approximate).     Assessment:   This is a routine wellness examination for Ronald Harding.  Hearing/Vision screen  Hearing Screening - Comments:: Denies hearing difficulties   Vision Screening - Comments:: Wears eyeglasses   Goals Addressed   None   Depression Screen    10/08/2023   11:30 AM 09/29/2022   10:40 AM 05/18/2022   11:01 AM 09/28/2021    9:16 AM 09/10/2020   10:47 AM 10/18/2018    9:52 AM 09/18/2017   11:19 AM  PHQ 2/9 Scores  PHQ - 2 Score 0 0 0 0 0 0 0  PHQ- 9 Score 0 0 0        Fall Risk    10/08/2023   11:29 AM 09/29/2022   10:39 AM 05/18/2022   11:01 AM 09/09/2021   10:51 AM 09/18/2017   11:19 AM  Fall Risk   Falls in the past year? 0 0 0 0 No  Number falls in past yr: 0 0 0 0   Injury with Fall? 0 0 0 0   Risk for fall due to : No Fall Risks No Fall Risks     Follow up Falls prevention discussed;Falls evaluation completed Falls evaluation completed       MEDICARE RISK AT HOME: Medicare Risk at Home Any stairs in or around the home?: Yes (outside) If so, are there any without handrails?: Yes Home free of loose throw rugs in walkways, pet beds, electrical cords, etc?: Yes Adequate lighting in your home to reduce risk of falls?: Yes Life alert?: No Use of a cane, walker or w/c?: No Grab bars in the bathroom?: Yes Shower chair or bench in shower?: Yes Elevated toilet seat or a handicapped toilet?: Yes  TIMED UP AND GO:  Was the test performed?  No     Cognitive Function:        Immunizations Immunization History  Administered Date(s) Administered   Fluad Quad(high Dose 65+) 09/09/2021, 09/29/2022   Fluad Trivalent(High Dose 65+) 08/08/2023   Influenza Inj Mdck Quad Pf 08/08/2019   Influenza,inj,Quad PF,6+ Mos 08/10/2017, 07/26/2018, 09/10/2020   Influenza-Unspecified 08/07/2019   PFIZER(Purple Top)SARS-COV-2 Vaccination 12/19/2019, 01/09/2020, 08/20/2020, 04/04/2021   Tdap 10/23/2010   Zoster Recombinant(Shingrix) 10/02/2022, 12/04/2022    TDAP status: Up to date  Flu Vaccine status: Up to date  Pneumococcal vaccine status: Declined,  Education has been provided regarding the importance of this vaccine but patient still declined. Advised may receive this vaccine at local pharmacy or Health Dept. Aware to provide a copy of the vaccination record if obtained from local pharmacy or Health Dept. Verbalized acceptance and understanding.   Covid-19 vaccine status: Declined, Education has been provided regarding the importance of this vaccine but patient still declined. Advised may receive this vaccine at local pharmacy or Health Dept.or vaccine clinic. Aware to provide a copy of the vaccination record if obtained from local pharmacy or Health Dept. Verbalized acceptance and understanding.  Qualifies for Shingles Vaccine? Yes   Zostavax completed Yes   Shingrix Completed?: No.    Education has been provided regarding the importance of this vaccine. Patient has been advised to call insurance company to determine out of pocket expense if they have not yet received this vaccine. Advised may also receive vaccine at local pharmacy or Health Dept. Verbalized acceptance and understanding.  Screening Tests Health Maintenance  Topic Date Due   COVID-19 Vaccine (5 - 2024-25 season) 10/24/2023 (Originally 06/24/2023)   Hepatitis C Screening  10/26/2023 (Originally 06/29/1974)   Pneumonia Vaccine 36+ Years old (1 of 2 - PCV) 10/07/2024 (Originally  06/29/1962)   Medicare Annual Wellness (AWV)  10/07/2024   Colonoscopy  04/23/2031   INFLUENZA VACCINE  Completed   Zoster Vaccines- Shingrix  Completed   HPV VACCINES  Aged Out   DTaP/Tdap/Td  Discontinued    Health Maintenance  There are no preventive care reminders to display for this patient.   Colorectal cancer screening: Type of screening: Colonoscopy. Completed 04/22/2021. Repeat every 10 years  Lung Cancer Screening: (Low Dose CT Chest recommended if Age 48-80 years, 20 pack-year currently smoking OR have quit w/in 15years.) does not qualify.   Lung Cancer Screening Referral: N/A  Additional Screening:  Hepatitis C Screening: does qualify;   Vision Screening: Recommended annual ophthalmology exams for early detection of glaucoma and other disorders of the eye. Is the patient up to date with their annual eye exam?  Yes  Who is the provider or what is the name of the office in which the patient attends annual eye exams? [facility] If pt is not established with a provider, would they like to be referred to a provider to establish care? No .   Dental Screening: Recommended annual dental exams for proper oral hygiene   Community Resource Referral / Chronic Care Management: CRR required this visit?  No   CCM required this visit?  No     Plan:     I have personally reviewed and noted the following in the patient's chart:   Medical and social history Use of alcohol, tobacco or illicit drugs  Current medications and supplements including opioid prescriptions. Patient is not currently taking opioid prescriptions. Functional ability and status Nutritional status Physical activity Advanced directives List of other physicians Hospitalizations, surgeries, and ER visits in previous 12 months Vitals Screenings to include cognitive, depression, and falls Referrals and appointments  In addition, I have reviewed and discussed with  patient certain preventive protocols, quality metrics, and best practice recommendations. A written personalized care plan for preventive services as well as Harding preventive health recommendations were provided to patient.     Ronald Harding L Jeannie Mallinger, CMA   10/08/2023   After Visit Summary: (MyChart) Due to this being a telephonic visit, the after visit summary with patients personalized plan was offered to patient via MyChart   Nurse Notes: Patient is due for a Hep C screening, which he has an up coming appointment with DR. Crawford.  He declines Pneumonia and Covid vaccines.  He has no concerns to address today.

## 2023-10-26 ENCOUNTER — Encounter: Payer: Self-pay | Admitting: Internal Medicine

## 2023-10-26 ENCOUNTER — Ambulatory Visit (INDEPENDENT_AMBULATORY_CARE_PROVIDER_SITE_OTHER): Payer: Medicare HMO | Admitting: Internal Medicine

## 2023-10-26 VITALS — BP 120/80 | HR 70 | Temp 98.2°F | Ht 72.0 in | Wt 306.0 lb

## 2023-10-26 DIAGNOSIS — R7303 Prediabetes: Secondary | ICD-10-CM

## 2023-10-26 DIAGNOSIS — I10 Essential (primary) hypertension: Secondary | ICD-10-CM | POA: Diagnosis not present

## 2023-10-26 DIAGNOSIS — I7121 Aneurysm of the ascending aorta, without rupture: Secondary | ICD-10-CM

## 2023-10-26 DIAGNOSIS — Z0000 Encounter for general adult medical examination without abnormal findings: Secondary | ICD-10-CM

## 2023-10-26 DIAGNOSIS — I700 Atherosclerosis of aorta: Secondary | ICD-10-CM | POA: Diagnosis not present

## 2023-10-26 LAB — URINALYSIS, ROUTINE W REFLEX MICROSCOPIC
Bilirubin Urine: NEGATIVE
Hgb urine dipstick: NEGATIVE
Ketones, ur: NEGATIVE
Leukocytes,Ua: NEGATIVE
Nitrite: NEGATIVE
Specific Gravity, Urine: 1.02 (ref 1.000–1.030)
Total Protein, Urine: NEGATIVE
Urine Glucose: NEGATIVE
Urobilinogen, UA: 0.2 (ref 0.0–1.0)
pH: 6 (ref 5.0–8.0)

## 2023-10-26 LAB — COMPREHENSIVE METABOLIC PANEL WITH GFR
ALT: 18 U/L (ref 0–53)
AST: 24 U/L (ref 0–37)
Albumin: 4.3 g/dL (ref 3.5–5.2)
Alkaline Phosphatase: 108 U/L (ref 39–117)
BUN: 18 mg/dL (ref 6–23)
CO2: 25 meq/L (ref 19–32)
Calcium: 9 mg/dL (ref 8.4–10.5)
Chloride: 104 meq/L (ref 96–112)
Creatinine, Ser: 0.84 mg/dL (ref 0.40–1.50)
GFR: 90.35 mL/min
Glucose, Bld: 79 mg/dL (ref 70–99)
Potassium: 3.5 meq/L (ref 3.5–5.1)
Sodium: 138 meq/L (ref 135–145)
Total Bilirubin: 0.4 mg/dL (ref 0.2–1.2)
Total Protein: 8 g/dL (ref 6.0–8.3)

## 2023-10-26 LAB — CBC
HCT: 43.1 % (ref 39.0–52.0)
Hemoglobin: 14.3 g/dL (ref 13.0–17.0)
MCHC: 33.3 g/dL (ref 30.0–36.0)
MCV: 89.7 fl (ref 78.0–100.0)
Platelets: 350 10*3/uL (ref 150.0–400.0)
RBC: 4.8 Mil/uL (ref 4.22–5.81)
RDW: 14.5 % (ref 11.5–15.5)
WBC: 6.7 10*3/uL (ref 4.0–10.5)

## 2023-10-26 LAB — LIPID PANEL
Cholesterol: 133 mg/dL (ref 0–200)
HDL: 38 mg/dL — ABNORMAL LOW
LDL Cholesterol: 74 mg/dL (ref 0–99)
NonHDL: 94.54
Total CHOL/HDL Ratio: 3
Triglycerides: 104 mg/dL (ref 0.0–149.0)
VLDL: 20.8 mg/dL (ref 0.0–40.0)

## 2023-10-26 LAB — HEMOGLOBIN A1C: Hgb A1c MFr Bld: 6 % (ref 4.6–6.5)

## 2023-10-26 MED ORDER — ALBUTEROL SULFATE HFA 108 (90 BASE) MCG/ACT IN AERS: 2.0000 | INHALATION_SPRAY | Freq: Four times a day (QID) | RESPIRATORY_TRACT | 2 refills | Status: AC | PRN

## 2023-10-26 MED ORDER — LOSARTAN POTASSIUM-HCTZ 50-12.5 MG PO TABS: 1.0000 | ORAL_TABLET | Freq: Every day | ORAL | 3 refills | Status: DC

## 2023-10-26 NOTE — Assessment & Plan Note (Signed)
 Taking aspirin and checking lipid panel.

## 2023-10-26 NOTE — Assessment & Plan Note (Signed)
 Weight stable counseled about diet and exercise to help.

## 2023-10-26 NOTE — Progress Notes (Signed)
   Subjective:   Patient ID: Ronald Harding, male    DOB: [birth year], 68 y.o.   MRN: [id number]  HPI The patient is here for physical.  PMH, [facility], social history reviewed and updated  Review of Systems  Constitutional: Negative.   HENT: Negative.    Eyes: Negative.   Respiratory:  Negative for cough, chest tightness and shortness of breath.   Cardiovascular:  Negative for chest pain, palpitations and leg swelling.  Gastrointestinal:  Negative for abdominal distention, abdominal pain, constipation, diarrhea, nausea and vomiting.  Musculoskeletal: Negative.   Skin: Negative.   Neurological: Negative.   Psychiatric/Behavioral: Negative.      Objective:  Physical Exam Constitutional:      Appearance: He is well-developed. He is obese.  HENT:     Head: Normocephalic and atraumatic.  Cardiovascular:     Rate and Rhythm: Normal rate and regular rhythm.  Pulmonary:     Effort: Pulmonary effort is normal. No respiratory distress.     Breath sounds: Normal breath sounds. No wheezing or rales.  Abdominal:     General: Bowel sounds are normal. There is no distension.     Palpations: Abdomen is soft.     Tenderness: There is no abdominal tenderness. There is no rebound.  Musculoskeletal:     Cervical back: Normal range of motion.  Skin:    General: Skin is warm and dry.  Neurological:     Mental Status: He is alert and oriented to person, place, and time.     Coordination: Coordination normal.     Vitals:   10/26/23 0932  BP: 120/80  Pulse: 70  Temp: 98.2 F (36.8 C)  TempSrc: Oral  SpO2: 98%  Weight: (!) 306 lb (138.8 kg)  Height: 6' (1.829 m)    Assessment & Plan:

## 2023-10-26 NOTE — Assessment & Plan Note (Signed)
 Checking HgA1c and overall improving recently.

## 2023-10-26 NOTE — Assessment & Plan Note (Signed)
Flu shot up to date. Pneumonia complete. Shingrix complete. Tetanus up to date. Colonoscopy up to date. Counseled about sun safety and mole surveillance. Counseled about the dangers of distracted driving. Given 10 year screening recommendations.

## 2023-10-26 NOTE — Assessment & Plan Note (Signed)
 BP at goal checking CMP and lipid panel. Adjust losartan/hydrochlorothiazide 50/12.5 mg daily as needed.

## 2023-10-26 NOTE — Assessment & Plan Note (Signed)
 Continues to follow up with vascular for monitoring.

## 2023-12-24 DIAGNOSIS — H02833 Dermatochalasis of right eye, unspecified eyelid: Secondary | ICD-10-CM | POA: Diagnosis not present

## 2023-12-24 DIAGNOSIS — H2513 Age-related nuclear cataract, bilateral: Secondary | ICD-10-CM | POA: Diagnosis not present

## 2023-12-24 DIAGNOSIS — H02836 Dermatochalasis of left eye, unspecified eyelid: Secondary | ICD-10-CM | POA: Diagnosis not present

## 2023-12-24 DIAGNOSIS — H34831 Tributary (branch) retinal vein occlusion, right eye, with macular edema: Secondary | ICD-10-CM | POA: Diagnosis not present

## 2024-01-07 DIAGNOSIS — H2513 Age-related nuclear cataract, bilateral: Secondary | ICD-10-CM | POA: Diagnosis not present

## 2024-01-07 DIAGNOSIS — H02836 Dermatochalasis of left eye, unspecified eyelid: Secondary | ICD-10-CM | POA: Diagnosis not present

## 2024-01-07 DIAGNOSIS — H34831 Tributary (branch) retinal vein occlusion, right eye, with macular edema: Secondary | ICD-10-CM | POA: Diagnosis not present

## 2024-01-07 DIAGNOSIS — H02833 Dermatochalasis of right eye, unspecified eyelid: Secondary | ICD-10-CM | POA: Diagnosis not present

## 2024-02-22 DIAGNOSIS — Z96641 Presence of right artificial hip joint: Secondary | ICD-10-CM | POA: Diagnosis not present

## 2024-02-25 DIAGNOSIS — H02833 Dermatochalasis of right eye, unspecified eyelid: Secondary | ICD-10-CM | POA: Diagnosis not present

## 2024-02-25 DIAGNOSIS — H02836 Dermatochalasis of left eye, unspecified eyelid: Secondary | ICD-10-CM | POA: Diagnosis not present

## 2024-02-25 DIAGNOSIS — H34831 Tributary (branch) retinal vein occlusion, right eye, with macular edema: Secondary | ICD-10-CM | POA: Diagnosis not present

## 2024-02-25 DIAGNOSIS — H2513 Age-related nuclear cataract, bilateral: Secondary | ICD-10-CM | POA: Diagnosis not present

## 2024-04-07 DIAGNOSIS — H2513 Age-related nuclear cataract, bilateral: Secondary | ICD-10-CM | POA: Diagnosis not present

## 2024-04-07 DIAGNOSIS — H34831 Tributary (branch) retinal vein occlusion, right eye, with macular edema: Secondary | ICD-10-CM | POA: Diagnosis not present

## 2024-04-07 DIAGNOSIS — H02836 Dermatochalasis of left eye, unspecified eyelid: Secondary | ICD-10-CM | POA: Diagnosis not present

## 2024-04-07 DIAGNOSIS — H02833 Dermatochalasis of right eye, unspecified eyelid: Secondary | ICD-10-CM | POA: Diagnosis not present

## 2024-05-12 ENCOUNTER — Encounter: Payer: Self-pay | Admitting: Internal Medicine

## 2024-06-16 DIAGNOSIS — H2513 Age-related nuclear cataract, bilateral: Secondary | ICD-10-CM | POA: Diagnosis not present

## 2024-06-16 DIAGNOSIS — H34831 Tributary (branch) retinal vein occlusion, right eye, with macular edema: Secondary | ICD-10-CM | POA: Diagnosis not present

## 2024-06-16 DIAGNOSIS — H02833 Dermatochalasis of right eye, unspecified eyelid: Secondary | ICD-10-CM | POA: Diagnosis not present

## 2024-06-16 DIAGNOSIS — H02836 Dermatochalasis of left eye, unspecified eyelid: Secondary | ICD-10-CM | POA: Diagnosis not present

## 2024-07-22 DIAGNOSIS — H02833 Dermatochalasis of right eye, unspecified eyelid: Secondary | ICD-10-CM | POA: Diagnosis not present

## 2024-07-22 DIAGNOSIS — H2513 Age-related nuclear cataract, bilateral: Secondary | ICD-10-CM | POA: Diagnosis not present

## 2024-07-22 DIAGNOSIS — H34831 Tributary (branch) retinal vein occlusion, right eye, with macular edema: Secondary | ICD-10-CM | POA: Diagnosis not present

## 2024-07-22 DIAGNOSIS — H02836 Dermatochalasis of left eye, unspecified eyelid: Secondary | ICD-10-CM | POA: Diagnosis not present

## 2024-07-22 LAB — OPHTHALMOLOGY REPORT-SCANNED

## 2024-07-29 ENCOUNTER — Encounter (HOSPITAL_COMMUNITY): Payer: Self-pay

## 2024-07-29 ENCOUNTER — Emergency Department (HOSPITAL_COMMUNITY)

## 2024-07-29 ENCOUNTER — Other Ambulatory Visit: Payer: Self-pay

## 2024-07-29 ENCOUNTER — Observation Stay (HOSPITAL_COMMUNITY)
Admission: EM | Admit: 2024-07-29 | Discharge: 2024-07-31 | Disposition: A | Discharge status: Home / Self Care | Attending: Family Medicine | Admitting: Family Medicine

## 2024-07-29 DIAGNOSIS — E876 Hypokalemia: Secondary | ICD-10-CM | POA: Insufficient documentation

## 2024-07-29 DIAGNOSIS — Z79899 Other long term (current) drug therapy: Secondary | ICD-10-CM | POA: Diagnosis not present

## 2024-07-29 DIAGNOSIS — Z7982 Long term (current) use of aspirin: Secondary | ICD-10-CM | POA: Insufficient documentation

## 2024-07-29 DIAGNOSIS — J45909 Unspecified asthma, uncomplicated: Secondary | ICD-10-CM | POA: Insufficient documentation

## 2024-07-29 DIAGNOSIS — Z96641 Presence of right artificial hip joint: Secondary | ICD-10-CM | POA: Diagnosis not present

## 2024-07-29 DIAGNOSIS — I10 Essential (primary) hypertension: Secondary | ICD-10-CM | POA: Diagnosis not present

## 2024-07-29 DIAGNOSIS — K567 Ileus, unspecified: Secondary | ICD-10-CM | POA: Diagnosis not present

## 2024-07-29 DIAGNOSIS — R140 Abdominal distension (gaseous): Secondary | ICD-10-CM | POA: Diagnosis not present

## 2024-07-29 DIAGNOSIS — K5730 Diverticulosis of large intestine without perforation or abscess without bleeding: Secondary | ICD-10-CM | POA: Diagnosis not present

## 2024-07-29 DIAGNOSIS — R1110 Vomiting, unspecified: Secondary | ICD-10-CM | POA: Diagnosis not present

## 2024-07-29 DIAGNOSIS — N400 Enlarged prostate without lower urinary tract symptoms: Secondary | ICD-10-CM | POA: Diagnosis not present

## 2024-07-29 DIAGNOSIS — Z6841 Body Mass Index (BMI) 40.0 and over, adult: Secondary | ICD-10-CM | POA: Diagnosis not present

## 2024-07-29 DIAGNOSIS — K56600 Partial intestinal obstruction, unspecified as to cause: Secondary | ICD-10-CM | POA: Diagnosis not present

## 2024-07-29 DIAGNOSIS — E278 Other specified disorders of adrenal gland: Secondary | ICD-10-CM | POA: Diagnosis not present

## 2024-07-29 DIAGNOSIS — Z23 Encounter for immunization: Secondary | ICD-10-CM | POA: Insufficient documentation

## 2024-07-29 DIAGNOSIS — R109 Unspecified abdominal pain: Secondary | ICD-10-CM | POA: Diagnosis present

## 2024-07-29 LAB — COMPREHENSIVE METABOLIC PANEL WITH GFR
ALT: 38 U/L (ref 0–44)
AST: 41 U/L (ref 15–41)
Albumin: 4.2 g/dL (ref 3.5–5.0)
Alkaline Phosphatase: 102 U/L (ref 38–126)
Anion gap: 15 (ref 5–15)
BUN: 13 mg/dL (ref 8–23)
CO2: 22 mmol/L (ref 22–32)
Calcium: 9.2 mg/dL (ref 8.9–10.3)
Chloride: 100 mmol/L (ref 98–111)
Creatinine, Ser: 0.94 mg/dL (ref 0.61–1.24)
GFR, Estimated: >60 mL/min
Glucose, Bld: 103 mg/dL — ABNORMAL HIGH (ref 70–99)
Potassium: 3.2 mmol/L — ABNORMAL LOW (ref 3.5–5.1)
Sodium: 138 mmol/L (ref 135–145)
Total Bilirubin: 0.7 mg/dL (ref 0.0–1.2)
Total Protein: 7.8 g/dL (ref 6.5–8.1)

## 2024-07-29 LAB — URINALYSIS, ROUTINE W REFLEX MICROSCOPIC
Bilirubin Urine: NEGATIVE
Glucose, UA: NEGATIVE mg/dL
Hgb urine dipstick: NEGATIVE
Ketones, ur: NEGATIVE mg/dL
Leukocytes,Ua: NEGATIVE
Nitrite: NEGATIVE
Protein, ur: NEGATIVE mg/dL
Specific Gravity, Urine: 1.02 (ref 1.005–1.030)
pH: 5 (ref 5.0–8.0)

## 2024-07-29 LAB — CBC WITH DIFFERENTIAL/PLATELET
Abs Immature Granulocytes: 0.01 10*3/uL (ref 0.00–0.07)
Basophils Absolute: 0 10*3/uL (ref 0.0–0.1)
Basophils Relative: 0 %
Eosinophils Absolute: 0.1 10*3/uL (ref 0.0–0.5)
Eosinophils Relative: 1 %
HCT: 42.8 % (ref 39.0–52.0)
Hemoglobin: 14.3 g/dL (ref 13.0–17.0)
Immature Granulocytes: 0 %
Lymphocytes Relative: 17 %
Lymphs Abs: 1.1 10*3/uL (ref 0.7–4.0)
MCH: 29.2 pg (ref 26.0–34.0)
MCHC: 33.4 g/dL (ref 30.0–36.0)
MCV: 87.3 fL (ref 80.0–100.0)
Monocytes Absolute: 0.9 10*3/uL (ref 0.1–1.0)
Monocytes Relative: 13 %
Neutro Abs: 4.5 10*3/uL (ref 1.7–7.7)
Neutrophils Relative %: 69 %
Platelets: 403 10*3/uL — ABNORMAL HIGH (ref 150–400)
RBC: 4.9 MIL/uL (ref 4.22–5.81)
RDW: 13.2 % (ref 11.5–15.5)
WBC: 6.5 10*3/uL (ref 4.0–10.5)
nRBC: 0 % (ref 0.0–0.2)

## 2024-07-29 LAB — CBC
HCT: 41.6 % (ref 39.0–52.0)
Hemoglobin: 13.5 g/dL (ref 13.0–17.0)
MCH: 28.3 pg (ref 26.0–34.0)
MCHC: 32.5 g/dL (ref 30.0–36.0)
MCV: 87.2 fL (ref 80.0–100.0)
Platelets: 359 10*3/uL (ref 150–400)
RBC: 4.77 MIL/uL (ref 4.22–5.81)
RDW: 13.2 % (ref 11.5–15.5)
WBC: 7.8 10*3/uL (ref 4.0–10.5)
nRBC: 0 % (ref 0.0–0.2)

## 2024-07-29 LAB — LIPASE, BLOOD: Lipase: 32 U/L (ref 11–51)

## 2024-07-29 LAB — CREATININE, SERUM
Creatinine, Ser: 0.93 mg/dL (ref 0.61–1.24)
GFR, Estimated: >60 mL/min

## 2024-07-29 MED ORDER — POTASSIUM CHLORIDE 2 MEQ/ML IV SOLN
INTRAVENOUS | Status: AC
  Filled 2024-07-29 (×3): qty 1000

## 2024-07-29 MED ORDER — ACETAMINOPHEN 325 MG PO TABS: 650.0000 mg | ORAL_TABLET | Freq: Four times a day (QID) | ORAL | Status: DC | PRN

## 2024-07-29 MED ORDER — BISACODYL 10 MG RE SUPP
10.0000 mg | Freq: Once | RECTAL | Status: AC
  Administered 2024-07-29: 10 mg via RECTAL
  Filled 2024-07-29: qty 1

## 2024-07-29 MED ORDER — POLYETHYLENE GLYCOL 3350 17 G PO PACK: 17.0000 g | PACK | Freq: Every day | ORAL | Status: DC | PRN

## 2024-07-29 MED ORDER — ENOXAPARIN SODIUM 80 MG/0.8ML IJ SOSY
70.0000 mg | PREFILLED_SYRINGE | INTRAMUSCULAR | Status: DC
  Administered 2024-07-30 – 2024-07-31 (×2): 70 mg via SUBCUTANEOUS
  Filled 2024-07-29 (×2): qty 0.8

## 2024-07-29 MED ORDER — PROCHLORPERAZINE EDISYLATE 10 MG/2ML IJ SOLN
5.0000 mg | Freq: Four times a day (QID) | INTRAMUSCULAR | Status: DC | PRN
  Administered 2024-07-30: 5 mg via INTRAVENOUS
  Filled 2024-07-29: qty 2

## 2024-07-29 MED ORDER — DIATRIZOATE MEGLUMINE & SODIUM 66-10 % PO SOLN
90.0000 mL | Freq: Once | ORAL | Status: AC
  Administered 2024-07-29: 90 mL via ORAL
  Filled 2024-07-29: qty 90

## 2024-07-29 MED ORDER — MELATONIN 5 MG PO TABS: 5.0000 mg | ORAL_TABLET | Freq: Every evening | ORAL | Status: DC | PRN

## 2024-07-29 MED ORDER — FENTANYL CITRATE PF 50 MCG/ML IJ SOSY
50.0000 ug | PREFILLED_SYRINGE | Freq: Once | INTRAMUSCULAR | Status: AC
  Administered 2024-07-29: 50 ug via INTRAVENOUS
  Filled 2024-07-29: qty 1

## 2024-07-29 MED ORDER — DIATRIZOATE MEGLUMINE & SODIUM 66-10 % PO SOLN: 90.0000 mL | Freq: Once | ORAL | Status: DC

## 2024-07-29 MED ORDER — IOHEXOL 300 MG/ML  SOLN
100.0000 mL | Freq: Once | INTRAMUSCULAR | Status: AC | PRN
  Administered 2024-07-29: 100 mL via INTRAVENOUS

## 2024-07-29 MED ORDER — ONDANSETRON HCL 4 MG/2ML IJ SOLN
4.0000 mg | Freq: Once | INTRAMUSCULAR | Status: AC
  Administered 2024-07-29: 4 mg via INTRAVENOUS
  Filled 2024-07-29: qty 2

## 2024-07-29 NOTE — ED Provider Notes (Signed)
 [place] EMERGENCY DEPARTMENT AT [facility] Provider Note   CSN: [id number] Arrival date & time: 07/29/24  1538     Patient presents with: Abdominal Pain   Ronald Harding is a 68 y.o. male.    Abdominal Pain Abdominal pain.  Some distention.  Vomiting a couple days ago but not today.  Has had previous ileus with similar symptoms.  Has had come in the hospital 3 times.  No fevers.  Still passing gas.      Past Medical History:  Diagnosis Date   Aortic aneurysm 2019   4.6 cm ascending thoracic aortic aneurysm   Arthritis    Asthma    hx of-as a child   History of colon polyps    benign   Hypertension    takes Hyzaar daily   Joint pain    Pneumonia    hx of-in the 70's   Pre-diabetes    SBO (small bowel obstruction) (HCC)    with NG tube placement    Prior to Admission medications   Medication Sig Start Date End Date Taking? Authorizing Provider  albuterol  (VENTOLIN  HFA) 108 (90 Base) MCG/ACT inhaler Inhale 2 puffs into the lungs every 6 (six) hours as needed for wheezing or shortness of breath. 10/26/23   Rollene Almarie LABOR, MD  aspirin  EC 81 MG tablet Take 81 mg by mouth daily. Swallow whole.    [provider]  losartan -hydrochlorothiazide  (HYZAAR) 50-12.5 MG tablet Take 1 tablet by mouth daily. 10/26/23   Rollene Almarie LABOR, MD    Allergies: Betadine [povidone iodine]    Review of Systems  Gastrointestinal:  Positive for abdominal pain.    Updated Vital Signs BP (!) 151/89 (BP Location: Left Arm)   Pulse 80   Temp 98.9 F (37.2 C) (Oral)   Resp 18   SpO2 95%   Physical Exam Nursing note reviewed.  Cardiovascular:     Rate and Rhythm: Normal rate.  Abdominal:     General: There is distension.     Tenderness: There is abdominal tenderness.  Skin:    General: Skin is warm.     Capillary Refill: Capillary refill takes less than 2 seconds.  Neurological:     Mental Status: He is alert and oriented to person, place, and  time.    Labs Reviewed  COMPREHENSIVE METABOLIC PANEL WITH GFR - Abnormal; Notable for the following components:      Result Value   Potassium 3.2 (*)    Glucose, Bld 103 (*)    All other components within normal limits  CBC WITH DIFFERENTIAL/PLATELET - Abnormal; Notable for the following components:   Platelets 403 (*)    All other components within normal limits  LIPASE, BLOOD  URINALYSIS, ROUTINE W REFLEX MICROSCOPIC    EKG: None  Radiology: CT ABDOMEN PELVIS W CONTRAST Result Date: 07/29/2024 CLINICAL DATA:  Bowel obstruction suspected EXAM: CT ABDOMEN AND PELVIS WITH CONTRAST TECHNIQUE: Multidetector CT imaging of the abdomen and pelvis was performed using the standard protocol following bolus administration of intravenous contrast. RADIATION DOSE REDUCTION: This exam was performed according to the departmental dose-optimization program which includes automated exposure control, adjustment of the mA and/or kV according to patient size and/or use of iterative reconstruction technique. CONTRAST:  OMNIPAQUE  IOHEXOL  300 MG/ML  SOLN COMPARISON:  X-ray abdomen July 29, 2024. FINDINGS: Lower chest: No acute abnormality. Hepatobiliary: No focal liver abnormality is seen. No gallstones, gallbladder wall thickening, or biliary dilatation. 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 No pancreatic ductal dilatation or surrounding inflammatory changes. Spleen: Normal in size without focal abnormality. Adrenals/Urinary Tract: Right adrenal nodule measuring 1.2 cm with Hounsfield of 48. Kidneys are normal, without renal calculi, focal lesion, or hydronephrosis. Bladder is partially visualized due to streak artifacts overlying the pelvic images. Stomach/Bowel: Stomach and duodenal are within normal limits. Appendix appears normal. There is dilation of the jejunal loops up to 4.8 cm with a transition point in right lower quadrant (11/63, 9/86),  without definite mass lesion, wall thickening or abnormal enhancement. Ileal loops appear unremarkable. Large bowel is not dilated. Colonic diverticulosis without diverticulitis. Vascular/Lymphatic: Atherosclerotic calcifications of aorta. No enlarged abdominal or pelvic lymph nodes. Reproductive: Prostatomegaly. Limited evaluation due to streak artifact from overlying right hip arthroplasty. Other: No abdominal wall hernia or abnormality. No abdominopelvic ascites. Musculoskeletal: No acute or significant osseous findings. Multilevel degenerative changes of the spine and narrowing of L5-S1 disc space. IMPRESSION: Dilated jejunal loops with a transition point in right lower quadrant (jejunoileal) without discrete obstructive lesion. Right adrenal nodule with indeterminate characteristics. Recommend further assessment with adrenal protocol CT with washout. 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 Prostatomegaly.  Correlate with PSA levels. Electronically Signed   By: Megan  Zare M.D.   On: 07/29/2024 19:25   DG Abdomen 1 View Result Date: 07/29/2024 CLINICAL DATA:  Abdominal pain and vomiting. EXAM: ABDOMEN - 1 VIEW COMPARISON:  Radiograph 01/20/2019 FINDINGS: Soft tissue attenuation from habitus limits radiographic assessment. Gaseous distention of bowel loops in the left abdomen measuring up to 7.6 cm. It is unclear if this represents dilated colon or small bowel. Small volume of formed stool in the right colon. Right hip arthroplasty. IMPRESSION: Gaseous distention of bowel loops in the left abdomen measuring up to 7.6 cm. It is unclear if this represents dilated colon or small bowel. Radiographic assessment is limited due to soft tissue attenuation from habitus. Recommend further assessment with CT. Electronically Signed   By: Andrea Gasman M.D.   On: 07/29/2024 16:27     Procedures   Medications Ordered in the ED  fentaNYL  (SUBLIMAZE ) injection 50 mcg (50 mcg Intravenous Given  07/29/24 1650)  ondansetron  (ZOFRAN ) injection 4 mg (4 mg Intravenous Given 07/29/24 1650)  iohexol  (OMNIPAQUE ) 300 MG/ML solution 100 mL (100 mLs Intravenous Contrast Given 07/29/24 1746)                                    Medical Decision Making Amount and/or Complexity of Data Reviewed Labs: ordered. Radiology: ordered.  Risk Prescription drug management. Decision regarding hospitalization.   Patient abdominal pain.  Distention.  Previous ileus.  Ileus versus bowel obstruction considered.  Blood work overall reassuring. Will get CT scan  CT scan shows dilated jejunum.  Does have transition point.  Still with pain.  I think with continued pain patient benefit from admission to the hospital.  Ileus versus partial small bowel obstruction.  Not actively vomiting at this time.  Do not think we need NG tube.  Requesting some liquid.  Will give small amount.  Discussed with hospitalist for admission and surgery can consult tomorrow.    Final diagnoses:  Partial small bowel obstruction [facility])    ED Discharge Orders     None          Patsey Lot, MD 07/29/24 1945

## 2024-07-29 NOTE — ED Triage Notes (Signed)
 Pt reports with upper abdominal pain and vomiting x 1 week. Pt has a hx of ileus.

## 2024-07-29 NOTE — H&P (Incomplete)
 History and Physical  Ronald Harding FMW:[id number] DOB: 1955/11/18 DOA: 07/29/2024  Referring physician: Dr. Patsey, EDP  PCP: Rollene Almarie LABOR, MD  Outpatient Specialists: None Patient coming from: Home  Chief Complaint: Abdominal pain, distention.  HPI: Ronald Harding is a 68 y.o. male with medical history significant for severe morbid obesity, hypertension, previous ileus (resolved spontaneously), who presents to the ER due to abdominal distention, abdominal pain, nausea and vomiting x 3 on Friday, then again on Saturday, 4 days ago.  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  However, today his symptoms recurred and they were severe.  Last bowel movement was today, solid stools x 2 around 10 AM.  Last food intake was breakfast this morning.  Currently denies nausea and vomiting.  No prior abdominal surgeries.    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  He broke multiple bones including right ankle, hip, skull, ect...  At the time of this visit, the patient denies flatulence.  He has hypoactive bowel sounds.  His CT abdomen and pelvis with contrast revealed dilated jejunal loops with a transition point in the right lower quadrant without discrete obstructive lesion.  Right adrenal nodule with indeterminate characteristics recommend further assessment with adrenal protocol CT with washout.  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  Prostatomegaly.  Correlate with PSA levels.  Admitted for possible ileus versus SBO.   ED Course: Temperature 98.  BP 144/86, pulse 83, respiration rate 20, O2 saturation 95% on room air.  Lab studies notable for potassium 3.2.  Review of Systems: Review of systems as noted in the HPI. All other systems reviewed and are negative.   Past Medical History:  Diagnosis Date   Aortic aneurysm 2019   4.6 cm ascending  thoracic aortic aneurysm   Arthritis    Asthma    hx of-as a child   History of colon polyps    benign   Hypertension    takes Hyzaar daily   Joint pain    Pneumonia    hx of-in the 70's   Pre-diabetes    SBO (small bowel obstruction) (HCC)    with NG tube placement   Past Surgical History:  Procedure Laterality Date   ANKLE SURGERY Right 10/23/2010   COLONOSCOPY     MULTIPLE EXTRACTIONS WITH ALVEOLOPLASTY N/A 09/23/2015   Procedure: Extraction of tooth #'s 1-12,14- 28, 30-32 with alveoloplasty, bilateral mandibular tori reductions and reduction of lateral exostoses.;  Surgeon: Tanda JULIANNA Fanny, DDS;  Location: [facility] OR;  Service: Oral Surgery;  Laterality: N/A;   NOSE SURGERY  10/24/2011   POLYPECTOMY     TOTAL HIP ARTHROPLASTY Right 03/01/2023   Procedure: TOTAL HIP ARTHROPLASTY ANTERIOR APPROACH;  Surgeon: Fidel Rogue, MD;  Location: [facility] ORS;  Service: Orthopedics;  Laterality: Right;  150    Social History:  reports that he has never smoked. He has quit using smokeless tobacco.  His smokeless tobacco use included chew. He reports that he does not drink alcohol  and does not use drugs.   Allergies  Allergen Reactions   Betadine [Povidone Iodine] Hives and Other (See Comments)    Pt states in 2012 in [place] he fell and broke several bones. Betadine broke him out.     Family History  Problem Relation Age of Onset   Hypertension Mother    Arthritis Mother  Hypertension Father    Heart disease Father    Arthritis Father    Rectal cancer Other    Stomach cancer Other    Esophageal cancer Other    Colon cancer Other    Colon polyps Other       Prior to Admission medications   Medication Sig Start Date End Date Taking? Authorizing Provider  albuterol  (VENTOLIN  HFA) 108 (90 Base) MCG/ACT inhaler Inhale 2 puffs into the lungs every 6 (six) hours as needed for wheezing or shortness of breath. 10/26/23  Yes Rollene Almarie LABOR, MD  aspirin  EC 81 MG tablet Take 81 mg  by mouth in the morning. Swallow whole.   Yes [provider]  losartan -hydrochlorothiazide  (HYZAAR) 50-12.5 MG tablet Take 1 tablet by mouth daily. 10/26/23  Yes Rollene Almarie LABOR, MD    Physical Exam: BP (!) 151/89 (BP Location: Left Arm)   Pulse 80   Temp 98.9 F (37.2 C) (Oral)   Resp 18   SpO2 95%   General: 68 y.o. year-old male well developed well nourished in no acute distress.  Alert and oriented x3. Cardiovascular: Regular rate and rhythm with no rubs or gallops.  No thyromegaly or JVD noted.  No lower extremity edema. 2/4 pulses in all 4 extremities. Respiratory: Clear to auscultation with no wheezes or rales. Good inspiratory effort. Abdomen: Obese, distended, hypoactive bowel sounds. Muskuloskeletal: No cyanosis or clubbing noted bilaterally Neuro: CN II-XII intact, strength, sensation, reflexes Skin: No ulcerative lesions noted or rashes Psychiatry: Judgement and insight appear normal. Mood is appropriate for condition and setting          Labs on Admission:  Basic Metabolic Panel: Recent Labs  Lab 07/29/24 1614  NA 138  K 3.2*  CL 100  CO2 22  GLUCOSE 103*  BUN 13  CREATININE 0.94  CALCIUM 9.2   Liver Function Tests: Recent Labs  Lab 07/29/24 1614  AST 41  ALT 38  ALKPHOS 102  BILITOT 0.7  PROT 7.8  ALBUMIN 4.2   Recent Labs  Lab 07/29/24 1614  LIPASE 32   No results for input(s): AMMONIA in the last 168 hours. CBC: Recent Labs  Lab 07/29/24 1614  WBC 6.5  NEUTROABS 4.5  HGB 14.3  HCT 42.8  MCV 87.3  PLT 403*   Cardiac Enzymes: No results for input(s): CKTOTAL, CKMB, CKMBINDEX, TROPONINI in the last 168 hours.  BNP (last 3 results) No results for input(s): BNP in the last 8760 hours.  ProBNP (last 3 results) No results for input(s): PROBNP in the last 8760 hours.  CBG: No results for input(s): GLUCAP in the last 168 hours.  Radiological Exams on Admission: CT ABDOMEN PELVIS W CONTRAST Result Date:  07/29/2024 CLINICAL DATA:  Bowel obstruction suspected EXAM: CT ABDOMEN AND PELVIS WITH CONTRAST TECHNIQUE: Multidetector CT imaging of the abdomen and pelvis was performed using the standard protocol following bolus administration of intravenous contrast. RADIATION DOSE REDUCTION: This exam was performed according to the departmental dose-optimization program which includes automated exposure control, adjustment of the mA and/or kV according to patient size and/or use of iterative reconstruction technique. CONTRAST:  OMNIPAQUE  IOHEXOL  300 MG/ML  SOLN COMPARISON:  X-ray abdomen July 29, 2024. FINDINGS: Lower chest: No acute abnormality. Hepatobiliary: No focal liver abnormality is seen. No gallstones, gallbladder wall thickening, or biliary dilatation. Pancreas: Fat attenuation lesion measuring 1.4 x 1.8 cm in the pancreatic head likely a lipoma. No pancreatic ductal dilatation or surrounding inflammatory changes. Spleen: Normal in size  without focal abnormality. Adrenals/Urinary Tract: Right adrenal nodule measuring 1.2 cm with Hounsfield of 48. Kidneys are normal, without renal calculi, focal lesion, or hydronephrosis. Bladder is partially visualized due to streak artifacts overlying the pelvic images. Stomach/Bowel: Stomach and duodenal are within normal limits. Appendix appears normal. There is dilation of the jejunal loops up to 4.8 cm with a transition point in right lower quadrant (11/63, 9/86), without definite mass lesion, wall thickening or abnormal enhancement. Ileal loops appear unremarkable. Large bowel is not dilated. Colonic diverticulosis without diverticulitis. Vascular/Lymphatic: Atherosclerotic calcifications of aorta. No enlarged abdominal or pelvic lymph nodes. Reproductive: Prostatomegaly. Limited evaluation due to streak artifact from overlying right hip arthroplasty. Other: No abdominal wall hernia or abnormality. No abdominopelvic ascites. Musculoskeletal: No acute or significant  osseous findings. Multilevel degenerative changes of the spine and narrowing of L5-S1 disc space. IMPRESSION: Dilated jejunal loops with a transition point in right lower quadrant (jejunoileal) without discrete obstructive lesion. Right adrenal nodule with indeterminate characteristics. Recommend further assessment with adrenal protocol CT with washout. 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 Prostatomegaly.  Correlate with PSA levels. Electronically Signed   By: Megan  Zare M.D.   On: 07/29/2024 19:25   DG Abdomen 1 View Result Date: 07/29/2024 CLINICAL DATA:  Abdominal pain and vomiting. EXAM: ABDOMEN - 1 VIEW COMPARISON:  Radiograph 01/20/2019 FINDINGS: Soft tissue attenuation from habitus limits radiographic assessment. Gaseous distention of bowel loops in the left abdomen measuring up to 7.6 cm. It is unclear if this represents dilated colon or small bowel. Small volume of formed stool in the right colon. Right hip arthroplasty. IMPRESSION: Gaseous distention of bowel loops in the left abdomen measuring up to 7.6 cm. It is unclear if this represents dilated colon or small bowel. Radiographic assessment is limited due to soft tissue attenuation from habitus. Recommend further assessment with CT. Electronically Signed   By: Andrea Gasman M.D.   On: 07/29/2024 16:27    EKG: I independently viewed the EKG done and my findings are as followed: None available at the time of this visit.  Assessment/Plan Present on Admission:  Ileus (HCC)  Principal Problem:   Ileus (HCC)  Ileus versus SBO 2 solid bowel movements today Currently no nausea or vomiting. N.p.o. except ice chips General Surgery consulted by EDP. Early mobilization Optimize magnesium and potassium levels P.o. Gastrografin  administered Follow repeat abdominal x-ray in the morning Dulcolax suppository 10 mg x 1  Hypertension On losartan  prior to admission Closely monitor vital signs  Severe morbid  obesity BMI 43 Recommend weight loss outpatient with regular physical activity and healthy diet.  Findings on CT scan Right adrenal nodule with indeterminate characteristics recommend further assessment with adrenal protocol CT with washout.  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  Prostatomegaly seen on CT scan Follow-up with PCP outpatient with PSA level   Time: 75 minutes.   DVT prophylaxis: Subcu Lovenox daily  Code Status: Full code.  Family Communication: Daughter updated at bedside.  Disposition Plan: Admitted to MedSurg unit.  Consults called: General Surgery consulted by EDP.  Admission status: Observation status.   Status is: Observation    Terry LOISE Hurst MD [facility] Pager [phone]  If 7PM-7AM, please contact night-coverage [email or web address] Password [facility]  07/29/2024, 8:37 PM

## 2024-07-30 ENCOUNTER — Observation Stay (HOSPITAL_COMMUNITY)

## 2024-07-30 DIAGNOSIS — Z96641 Presence of right artificial hip joint: Secondary | ICD-10-CM | POA: Diagnosis not present

## 2024-07-30 DIAGNOSIS — K567 Ileus, unspecified: Secondary | ICD-10-CM | POA: Diagnosis not present

## 2024-07-30 DIAGNOSIS — K5669 Other partial intestinal obstruction: Secondary | ICD-10-CM | POA: Diagnosis not present

## 2024-07-30 LAB — CBC
HCT: 40.8 % (ref 39.0–52.0)
Hemoglobin: 13.4 g/dL (ref 13.0–17.0)
MCH: 29.3 pg (ref 26.0–34.0)
MCHC: 32.8 g/dL (ref 30.0–36.0)
MCV: 89.1 fL (ref 80.0–100.0)
Platelets: 376 10*3/uL (ref 150–400)
RBC: 4.58 MIL/uL (ref 4.22–5.81)
RDW: 13.2 % (ref 11.5–15.5)
WBC: 6.7 10*3/uL (ref 4.0–10.5)
nRBC: 0 % (ref 0.0–0.2)

## 2024-07-30 LAB — BASIC METABOLIC PANEL WITH GFR
Anion gap: 13 (ref 5–15)
BUN: 14 mg/dL (ref 8–23)
CO2: 22 mmol/L (ref 22–32)
Calcium: 8.9 mg/dL (ref 8.9–10.3)
Chloride: 102 mmol/L (ref 98–111)
Creatinine, Ser: 0.97 mg/dL (ref 0.61–1.24)
GFR, Estimated: >60 mL/min
Glucose, Bld: 99 mg/dL (ref 70–99)
Potassium: 3.4 mmol/L — ABNORMAL LOW (ref 3.5–5.1)
Sodium: 137 mmol/L (ref 135–145)

## 2024-07-30 LAB — MAGNESIUM: Magnesium: 2.6 mg/dL — ABNORMAL HIGH (ref 1.7–2.4)

## 2024-07-30 LAB — HIV ANTIBODY (ROUTINE TESTING W REFLEX): HIV Screen 4th Generation wRfx: NONREACTIVE

## 2024-07-30 LAB — PHOSPHORUS: Phosphorus: 4.3 mg/dL (ref 2.5–4.6)

## 2024-07-30 MED ORDER — POTASSIUM CHLORIDE 10 MEQ/100ML IV SOLN
10.0000 meq | INTRAVENOUS | Status: AC
  Administered 2024-07-30 (×2): 10 meq via INTRAVENOUS
  Filled 2024-07-30 (×2): qty 100

## 2024-07-30 MED ORDER — SIMETHICONE 80 MG PO CHEW
80.0000 mg | CHEWABLE_TABLET | Freq: Four times a day (QID) | ORAL | Status: DC | PRN
  Administered 2024-07-30: 80 mg via ORAL
  Filled 2024-07-30: qty 1

## 2024-07-30 MED ORDER — BISACODYL 10 MG RE SUPP: 10.0000 mg | Freq: Once | RECTAL | Status: DC

## 2024-07-30 NOTE — Consult Note (Signed)
 Consult Note  Ronald Harding 09-Nov-1955  [id number].    Requesting MD:  Dr. Duffy Larch, MD  Chief Complaint/Reason for Consult: Abdominal pain, distention  HPI:  Ronald Harding is a 67 year old male with a PMH of obesity, HTN, pre-diabetes, asthma, arthritis, aortic aneurysm, and previous SBO (resolved spontaneously) that presented to the ED on 10/7 due to abdominal pain and distention. Patient reports that the pain began about a week ago and increased in severity. Reports associated nausea and vomiting. Last episode of vomiting was 4 days ago. In the ED, patient explained that his last BM was earlier that morning (x2 on 10/7). Denies hematochezia. His last meal was also during the morning of 10/7. Patient states that this is his 4th episode of small bowel obstruction.   His workup showed concerns of a bowel obstruction. Imaging revealed dilated jejunal loops with a transition point in the right lower quadrant without discrete obstructive lesion. General surgery was asked to consult.  Today, patient states that his pain has greatly improved. He has had 2 bowel movements since being hospitalized. Reports flatulence. Denies vomiting. Reports improvement of abdominal distention. He is currently NPO.   Colonoscopy: Completed 3 years ago Abdominal surgery: Denies Tobacco use: Daily use of chewing tobacco Alcohol  consumption: Denies Anticoagulation/Antiplatelet medications: 81 mg aspirin  daily Illicit drug use: Denies   ROS: Per HPI  Family History  Problem Relation Age of Onset   Hypertension Mother    Arthritis Mother    Hypertension Father    Heart disease Father    Arthritis Father    Rectal cancer Other    Stomach cancer Other    Esophageal cancer Other    Colon cancer Other    Colon polyps Other     Past Medical History:  Diagnosis Date   Aortic aneurysm 2019   4.6 cm ascending thoracic aortic aneurysm   Arthritis    Asthma    hx of-as a child    History of colon polyps    benign   Hypertension    takes Hyzaar daily   Joint pain    Pneumonia    hx of-in the 70's   Pre-diabetes    SBO (small bowel obstruction) (HCC)    with NG tube placement    Past Surgical History:  Procedure Laterality Date   ANKLE SURGERY Right 10/23/2010   COLONOSCOPY     MULTIPLE EXTRACTIONS WITH ALVEOLOPLASTY N/A 09/23/2015   Procedure: Extraction of tooth #'s 1-12,14- 28, 30-32 with alveoloplasty, bilateral mandibular tori reductions and reduction of lateral exostoses.;  Surgeon: Tanda JULIANNA Fanny, DDS;  Location: [facility] OR;  Service: Oral Surgery;  Laterality: N/A;   NOSE SURGERY  10/24/2011   POLYPECTOMY     TOTAL HIP ARTHROPLASTY Right 03/01/2023   Procedure: TOTAL HIP ARTHROPLASTY ANTERIOR APPROACH;  Surgeon: Fidel Rogue, MD;  Location: [facility] ORS;  Service: Orthopedics;  Laterality: Right;  150    Social History:  reports that he has never smoked. He has quit using smokeless tobacco.  His smokeless tobacco use included chew. He reports that he does not drink alcohol  and does not use drugs.  Allergies:  Allergies  Allergen Reactions   Betadine [Povidone Iodine] Hives and Other (See Comments)    Pt states in 2012 in [place] he fell and broke several bones. Betadine broke him out.     Medications Prior to Admission  Medication Sig Dispense Refill   albuterol  (VENTOLIN  HFA) 108 (90 Base) MCG/ACT inhaler  Inhale 2 puffs into the lungs every 6 (six) hours as needed for wheezing or shortness of breath. 1 each 2   aspirin  EC 81 MG tablet Take 81 mg by mouth in the morning. Swallow whole.     losartan -hydrochlorothiazide  (HYZAAR) 50-12.5 MG tablet Take 1 tablet by mouth daily. 90 tablet 3    Blood pressure (!) 142/78, pulse 72, temperature 98.6 F (37 C), resp. rate 18, height 6' (1.829 m), weight (!) 145.6 kg, SpO2 96%. Physical Exam:  General: Pleasant male who is laying in bed in NAD. HEENT: Head is normocephalic, atraumatic.   Heart: HR  normal. Lungs: Respiratory effort nonlabored. Abd: Soft with mild distention. NT. No rebound tenderness or guarding. Skin: Warm and dry Psych: A&Ox3 with an appropriate affect.   Results for orders placed or performed during the hospital encounter of 07/29/24 (from the past 48 hours)  Comprehensive metabolic panel     Status: Abnormal   Collection Time: 07/29/24  4:14 PM  Result Value Ref Range   Sodium 138 135 - 145 mmol/L   Potassium 3.2 (L) 3.5 - 5.1 mmol/L   Chloride 100 98 - 111 mmol/L   CO2 22 22 - 32 mmol/L   Glucose, Bld 103 (H) 70 - 99 mg/dL    Comment: Glucose reference range applies only to samples taken after fasting for at least 8 hours.   BUN 13 8 - 23 mg/dL   Creatinine, Ser 9.05 0.61 - 1.24 mg/dL   Calcium 9.2 8.9 - 89.6 mg/dL   Total Protein 7.8 6.5 - 8.1 g/dL   Albumin 4.2 3.5 - 5.0 g/dL   AST 41 15 - 41 U/L   ALT 38 0 - 44 U/L   Alkaline Phosphatase 102 38 - 126 U/L   Total Bilirubin 0.7 0.0 - 1.2 mg/dL   GFR, Estimated >39 >39 mL/min    Comment: (NOTE) Calculated using the CKD-EPI Creatinine Equation (2021)    Anion gap 15 5 - 15    Comment: Performed at [facility], [address] [place], [place] [address]  Lipase, blood     Status: None   Collection Time: 07/29/24  4:14 PM  Result Value Ref Range   Lipase 32 11 - 51 U/L    Comment: Performed at [facility], [address], [place], [place] [address]  CBC with Diff     Status: Abnormal   Collection Time: 07/29/24  4:14 PM  Result Value Ref Range   WBC 6.5 4.0 - 10.5 K/uL   RBC 4.90 4.22 - 5.81 MIL/uL   Hemoglobin 14.3 13.0 - 17.0 g/dL   HCT 57.1 60.9 - 47.9 %   MCV 87.3 80.0 - 100.0 fL   MCH 29.2 26.0 - 34.0 pg   MCHC 33.4 30.0 - 36.0 g/dL   RDW 86.7 88.4 - 84.4 %   Platelets 403 (H) 150 - 400 K/uL   nRBC 0.0 0.0 - 0.2 %   Neutrophils Relative % 69 %   Neutro Abs 4.5 1.7 - 7.7 K/uL   Lymphocytes Relative 17 %   Lymphs Abs 1.1 0.7 - 4.0 K/uL    Monocytes Relative 13 %   Monocytes Absolute 0.9 0.1 - 1.0 K/uL   Eosinophils Relative 1 %   Eosinophils Absolute 0.1 0.0 - 0.5 K/uL   Basophils Relative 0 %   Basophils Absolute 0.0 0.0 - 0.1 K/uL   Immature Granulocytes 0 %   Abs Immature Granulocytes 0.01 0.00 - 0.07  K/uL    Comment: Performed at [facility], [address], [place], [place] [address]  Urinalysis, Routine w reflex microscopic -Urine, Clean Catch     Status: None   Collection Time: 07/29/24  4:15 PM  Result Value Ref Range   Color, Urine YELLOW YELLOW   APPearance CLEAR CLEAR   Specific Gravity, Urine 1.020 1.005 - 1.030   pH 5.0 5.0 - 8.0   Glucose, UA NEGATIVE NEGATIVE mg/dL   Hgb urine dipstick NEGATIVE NEGATIVE   Bilirubin Urine NEGATIVE NEGATIVE   Ketones, ur NEGATIVE NEGATIVE mg/dL   Protein, ur NEGATIVE NEGATIVE mg/dL   Nitrite NEGATIVE NEGATIVE   Leukocytes,Ua NEGATIVE NEGATIVE    Comment: Performed at [facility], [address] [place], [place] [address]  CBC     Status: None   Collection Time: 07/29/24 11:11 PM  Result Value Ref Range   WBC 7.8 4.0 - 10.5 K/uL   RBC 4.77 4.22 - 5.81 MIL/uL   Hemoglobin 13.5 13.0 - 17.0 g/dL   HCT 58.3 60.9 - 47.9 %   MCV 87.2 80.0 - 100.0 fL   MCH 28.3 26.0 - 34.0 pg   MCHC 32.5 30.0 - 36.0 g/dL   RDW 86.7 88.4 - 84.4 %   Platelets 359 150 - 400 K/uL   nRBC 0.0 0.0 - 0.2 %    Comment: Performed at [facility], [address] [place], [place] [address]  Creatinine, serum     Status: None   Collection Time: 07/29/24 11:11 PM  Result Value Ref Range   Creatinine, Ser 0.93 0.61 - 1.24 mg/dL   GFR, Estimated >39 >39 mL/min    Comment: (NOTE) Calculated using the CKD-EPI Creatinine Equation (2021) Performed at [facility], [address], [place], [place] [address]   CBC     Status: None   Collection Time: 07/30/24  4:57 AM  Result Value Ref Range   WBC 6.7 4.0 - 10.5 K/uL    RBC 4.58 4.22 - 5.81 MIL/uL   Hemoglobin 13.4 13.0 - 17.0 g/dL   HCT 59.1 60.9 - 47.9 %   MCV 89.1 80.0 - 100.0 fL   MCH 29.3 26.0 - 34.0 pg   MCHC 32.8 30.0 - 36.0 g/dL   RDW 86.7 88.4 - 84.4 %   Platelets 376 150 - 400 K/uL   nRBC 0.0 0.0 - 0.2 %    Comment: Performed at [facility], [address] [place], [place] [address]  Basic metabolic panel     Status: Abnormal   Collection Time: 07/30/24  4:57 AM  Result Value Ref Range   Sodium 137 135 - 145 mmol/L   Potassium 3.4 (L) 3.5 - 5.1 mmol/L   Chloride 102 98 - 111 mmol/L   CO2 22 22 - 32 mmol/L   Glucose, Bld 99 70 - 99 mg/dL    Comment: Glucose reference range applies only to samples taken after fasting for at least 8 hours.   BUN 14 8 - 23 mg/dL   Creatinine, Ser 9.02 0.61 - 1.24 mg/dL   Calcium 8.9 8.9 - 89.6 mg/dL   GFR, Estimated >39 >39 mL/min    Comment: (NOTE) Calculated using the CKD-EPI Creatinine Equation (2021)    Anion gap 13 5 - 15    Comment: Performed at [facility], [address], [place], [place] [address]  Magnesium     Status: Abnormal   Collection Time: 07/30/24  4:57 AM  Result Value Ref Range   Magnesium 2.6 (H) 1.7 - 2.4 mg/dL    Comment: Performed at [facility], [address], [place], [place] [address]  Phosphorus     Status: None   Collection Time: 07/30/24  4:57 AM  Result Value Ref Range   Phosphorus 4.3 2.5 - 4.6 mg/dL    Comment: Performed at [facility], [address], [place], [place] [address]   DG Abd Portable 1V Result Date: 07/30/2024 CLINICAL DATA:  Ileus. EXAM: PORTABLE ABDOMEN - 1 VIEW COMPARISON:  07/29/2024 FINDINGS: Decreased small bowel dilatation is seen in the central abdomen compared to prior study. Increased gas and contrast material now seen throughout the length of the colon. These findings may be seen with improving small bowel obstruction or colonic ileus. Contrast also seen within  urinary bladder. Right hip prosthesis also noted. IMPRESSION: Decreased small bowel dilatation, with increased gas and contrast material throughout the colon. These findings may be seen with improving small bowel obstruction or colonic ileus. Electronically Signed   By: Norleen DELENA Kil M.D.   On: 07/30/2024 06:39   CT ABDOMEN PELVIS W CONTRAST Result Date: 07/29/2024 CLINICAL DATA:  Bowel obstruction suspected EXAM: CT ABDOMEN AND PELVIS WITH CONTRAST TECHNIQUE: Multidetector CT imaging of the abdomen and pelvis was performed using the standard protocol following bolus administration of intravenous contrast. RADIATION DOSE REDUCTION: This exam was performed according to the departmental dose-optimization program which includes automated exposure control, adjustment of the mA and/or kV according to patient size and/or use of iterative reconstruction technique. CONTRAST:  OMNIPAQUE  IOHEXOL  300 MG/ML  SOLN COMPARISON:  X-ray abdomen July 29, 2024. FINDINGS: Lower chest: No acute abnormality. Hepatobiliary: No focal liver abnormality is seen. No gallstones, gallbladder wall thickening, or biliary dilatation. Pancreas: Fat attenuation lesion measuring 1.4 x 1.8 cm in the pancreatic head likely a lipoma. No pancreatic ductal dilatation or surrounding inflammatory changes. Spleen: Normal in size without focal abnormality. Adrenals/Urinary Tract: Right adrenal nodule measuring 1.2 cm with Hounsfield of 48. Kidneys are normal, without renal calculi, focal lesion, or hydronephrosis. Bladder is partially visualized due to streak artifacts overlying the pelvic images. Stomach/Bowel: Stomach and duodenal are within normal limits. Appendix appears normal. There is dilation of the jejunal loops up to 4.8 cm with a transition point in right lower quadrant (11/63, 9/86), without definite mass lesion, wall thickening or abnormal enhancement. Ileal loops appear unremarkable. Large bowel is not dilated. Colonic diverticulosis  without diverticulitis. Vascular/Lymphatic: Atherosclerotic calcifications of aorta. No enlarged abdominal or pelvic lymph nodes. Reproductive: Prostatomegaly. Limited evaluation due to streak artifact from overlying right hip arthroplasty. Other: No abdominal wall hernia or abnormality. No abdominopelvic ascites. Musculoskeletal: No acute or significant osseous findings. Multilevel degenerative changes of the spine and narrowing of L5-S1 disc space. IMPRESSION: Dilated jejunal loops with a transition point in right lower quadrant (jejunoileal) without discrete obstructive lesion. Right adrenal nodule with indeterminate characteristics. Recommend further assessment with adrenal protocol CT with washout. 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 Prostatomegaly.  Correlate with PSA levels. Electronically Signed   By: Megan  Zare M.D.   On: 07/29/2024 19:25   DG Abdomen 1 View Result Date: 07/29/2024 CLINICAL DATA:  Abdominal pain and vomiting. EXAM: ABDOMEN - 1 VIEW COMPARISON:  Radiograph 01/20/2019 FINDINGS: Soft tissue attenuation from habitus limits radiographic assessment. Gaseous distention of bowel loops in the left abdomen measuring up to 7.6 cm. It is unclear if this represents dilated  colon or small bowel. Small volume of formed stool in the right colon. Right hip arthroplasty. IMPRESSION: Gaseous distention of bowel loops in the left abdomen measuring up to 7.6 cm. It is unclear if this represents dilated colon or small bowel. Radiographic assessment is limited due to soft tissue attenuation from habitus. Recommend further assessment with CT. Electronically Signed   By: Andrea Gasman M.D.   On: 07/29/2024 16:27      Assessment/Plan Ileus vs SBO -CT from 10/7 showed dilated jejunal loops with a transition point in right lower quadrant (jejunoileal) without discrete obstructive lesion. -DG abd from 10/8 today showed decreased small bowel dilatation, with increased gas and  contrast material throughout the colon. These findings may be seen with improving small bowel obstruction or colonic ileus. -Afebrile. -CBC WNL -Patient has had great improvement in abdominal pain and distention overnight. Denies n/v. Has had BM and flatulence.  -No surgical intervention recommended at this time. Hopeful, that his symptoms continue to improve and this resolves with conservative management.  -Will trial CLD. Should patient have concerns of n/v, abdominal pain, abdominal distention would recommend NGT and NPO. Otherwise, can advance as tolerated.  -Will continue to follow.   FEN: CLD; IVF per primary team. VTE: Enoxaparin ID: None currently   I reviewed ED notes, nursing notes, specialist notes, hospitalist notes, last 24 h vitals and pain scores, last 48 h intake and output, last 24 h labs and trends, and last 24 h imaging results.  This care required high  level of medical decision making.   Marjorie Carlyon Favre, [facility] Surgery 07/30/2024, 8:20 AM Please see Amion for pager number during day hours 7:00am-4:30pm

## 2024-07-30 NOTE — Plan of Care (Signed)
  Problem: Education: Goal: Knowledge of General Education information will improve Description: Including pain rating scale, medication(s)/side effects and non-pharmacologic comfort measures Outcome: Progressing   Problem: Clinical Measurements: Goal: Ability to maintain clinical measurements within normal limits will improve Outcome: Progressing   Problem: Activity: Goal: Risk for activity intolerance will decrease Outcome: Progressing   Problem: Elimination: Goal: Will not experience complications related to urinary retention Outcome: Progressing   Problem: Pain Managment: Goal: General experience of comfort will improve and/or be controlled Outcome: Progressing   Problem: Safety: Goal: Ability to remain free from injury will improve Outcome: Progressing   Problem: Skin Integrity: Goal: Risk for impaired skin integrity will decrease Outcome: Progressing

## 2024-07-30 NOTE — Plan of Care (Signed)

## 2024-07-30 NOTE — Progress Notes (Addendum)
 Progress Note   Patient: Ronald Harding FMW:[id number] DOB: May 16, 1956 DOA: 07/29/2024     0 DOS: the patient was seen and examined on 07/30/2024   Brief hospital course: The patient is a 68 year old male with a past ministry of HTN, morbid obesity, and three prior episodes of ileus with spontaneous resolution who presented to the ER on 07/29/2024 due to abdominal distention, abdominal pain, nausea and vomiting last Friday and Saturday (4 days ago).  Symptoms improved spontaneously the following few days but recurred severely on Tuesday 10/7.  On presentation, he denied nausea and vomiting and reported a last bowel movement around 10 AM.  CT abdomen pelvis with contrast revealed dilated jejunal loops with a transition point in the right lower quadrant without discrete obstructive lesion.  Additionally, an incidental finding of a right adrenal nodule with indeterminate characteristics was noted, in addition to fat attenuation lesion in pancreatic head likely lipoma, and prostatomegaly.  Labs on admission were notable for potassium 3.2.  Vital signs were stable.He was admitted for possible ileus versus SBO.  Assessment and Plan:  #Ileus versus SBO - KUB (10/7) showed gaseous distention of bowel loops in the left abdomen measuring up to 7.6 cm. - CT AP (10/7) showed dilated jejunal loops with a transition point in the right lower quadrant (jejunoileal) without discrete obstructive lesion. - KUB (10/8) showed decreased small bowel dilatation with increased gas and contrast material throughout the colon, likely consistent with improving small bowel obstruction or colonic ileus. - Patient managed conservatively overnight, reports significant improvement in abdominal pain and distention today.  Had 4 bowel movements today. Denies nausea and vomiting - Tolerated CLD and FLD. Advanced diet to soft regular.  - If tolerates regular diet, can be discharged in the morning  #Hypokalemia - improving - K 3 point.2  on admission, improved to 3.4 today - Allowing LR + KCl 40 mEq at 100 mL/hr to run until tonight  #Hypertension - Home Losartan -hydrochlorothiazide  initially held due to NPO - SBP within normal range today. Can resume in the AM if BPs increase.  # Severe morbid obesity - Body mass index is 43.54 kg/m. - Commend follow-up with PCP in the outpatient setting for weight loss lifestyle modification  #Right adrenal incidentaloma - CTAP showed right adrenal nodule measuring 1.2 cm with 48 Hounsfield units.  - Radiology recommended further assessment with adrenal protocol CT with washout - Recommend follow-up with PCP to complete imaging  # Prostatomegaly - Prostatomegaly noted on CT scan - Recommend follow-up with PCP to correlate with PSA levels     Subjective: Patient was seen at bedside today.  No acute events overnight per patient or RN staff.  Patient's daughter, Ronald Harding, was at bedside as well.  Patient reports significant improvement in pain and abdominal distention.  Reports this is his fourth episode of ileus/SBO and has been by for the mildest.  At this time, he has tolerated full liquid diet and is wanting to trial a regular diet.  He reports 4 bowel movements so far today.  Denies nausea vomiting, abdominal pain, chest pain, shortness of breath, fevers, chills.  Physical Exam: Vitals:   07/30/24 0453 07/30/24 0934 07/30/24 1225 07/30/24 2026  BP: (!) 142/78 133/70 131/80 127/68  Pulse: 72 65 64 65  Resp: 18 18 18 19   Temp: 98.6 F (37 C) 97.7 F (36.5 C) 98.3 F (36.8 C) 98.3 F (36.8 C)  TempSrc:   Oral   SpO2: 96% 98% 98% 98%  Weight:  Height:       Physical Exam Constitutional:      General: He is not in acute distress.    Appearance: He is obese. He is not ill-appearing.  HENT:     Mouth/Throat:     Mouth: Mucous membranes are moist.  Cardiovascular:     Rate and Rhythm: Normal rate and regular rhythm.     Heart sounds: Normal heart sounds. No murmur  heard. Pulmonary:     Effort: Pulmonary effort is normal. No respiratory distress.     Breath sounds: Normal breath sounds. No wheezing.  Abdominal:     General: Bowel sounds are increased. There is distension.     Palpations: Abdomen is soft.     Tenderness: There is no abdominal tenderness. There is no guarding or rebound.  Musculoskeletal:     Right lower leg: No edema.     Left lower leg: No edema.  Skin:    General: Skin is warm and dry.     Capillary Refill: Capillary refill takes less than 2 seconds.  Neurological:     Mental Status: He is alert and oriented to person, place, and time. Mental status is at baseline.      Family Communication: Discussed with patient and patient's daughter Ronald Harding at bedside.  Disposition: Status is: Observation The patient remains OBS appropriate and will d/c before 2 midnights.  Planned Discharge Destination: Home    Time spent: 40 minutes  Author: Nancyann Cotterman Al-Sultani, MD 07/30/2024 9:00 PM  For on call review [email or web address].

## 2024-07-30 NOTE — Hospital Course (Addendum)
 The patient is a 68 year old male with a past ministry of HTN, morbid obesity, and three prior episodes of ileus with spontaneous resolution who presented to the ER on 07/29/2024 due to abdominal distention, abdominal pain, nausea and vomiting last Friday and Saturday (4 days ago).  Symptoms improved spontaneously the following few days but recurred severely on Tuesday 10/7.  On presentation, he denied nausea and vomiting and reported a last bowel movement around 10 AM.  CT abdomen pelvis with contrast revealed dilated jejunal loops with a transition point in the right lower quadrant without discrete obstructive lesion.  Additionally, an incidental finding of a right adrenal nodule with indeterminate characteristics was noted, in addition to fat attenuation lesion in pancreatic head likely lipoma, and prostatomegaly.  Labs on admission were notable for potassium 3.2.  Vital signs were stable.He was admitted for possible ileus versus SBO.

## 2024-07-31 DIAGNOSIS — K5669 Other partial intestinal obstruction: Secondary | ICD-10-CM | POA: Diagnosis not present

## 2024-07-31 DIAGNOSIS — K567 Ileus, unspecified: Secondary | ICD-10-CM | POA: Diagnosis not present

## 2024-07-31 LAB — BASIC METABOLIC PANEL WITH GFR
Anion gap: 10 (ref 5–15)
BUN: 9 mg/dL (ref 8–23)
CO2: 24 mmol/L (ref 22–32)
Calcium: 8.9 mg/dL (ref 8.9–10.3)
Chloride: 105 mmol/L (ref 98–111)
Creatinine, Ser: 0.93 mg/dL (ref 0.61–1.24)
GFR, Estimated: >60 mL/min
Glucose, Bld: 127 mg/dL — ABNORMAL HIGH (ref 70–99)
Potassium: 3.6 mmol/L (ref 3.5–5.1)
Sodium: 138 mmol/L (ref 135–145)

## 2024-07-31 MED ORDER — INFLUENZA VAC SPLIT HIGH-DOSE 0.5 ML IM SUSY
0.5000 mL | PREFILLED_SYRINGE | INTRAMUSCULAR | Status: AC
  Administered 2024-07-31: 0.5 mL via INTRAMUSCULAR
  Filled 2024-07-31: qty 0.5

## 2024-07-31 NOTE — Plan of Care (Signed)
  Problem: Clinical Measurements: Goal: Will remain free from infection Outcome: Progressing Goal: Respiratory complications will improve Outcome: Progressing   Problem: Activity: Goal: Risk for activity intolerance will decrease Outcome: Progressing   Problem: Coping: Goal: Level of anxiety will decrease Outcome: Progressing   Problem: Elimination: Goal: Will not experience complications related to bowel motility Outcome: Progressing Goal: Will not experience complications related to urinary retention Outcome: Progressing   Problem: Pain Managment: Goal: General experience of comfort will improve and/or be controlled Outcome: Progressing

## 2024-07-31 NOTE — Plan of Care (Signed)
  Problem: Education: Goal: Knowledge of General Education information will improve Description: Including pain rating scale, medication(s)/side effects and non-pharmacologic comfort measures Outcome: Progressing   Problem: Health Behavior/Discharge Planning: Goal: Ability to manage health-related needs will improve Outcome: Progressing   Problem: Clinical Measurements: Goal: Will remain free from infection Outcome: Progressing Goal: Diagnostic test results will improve Outcome: Progressing   Problem: Coping: Goal: Level of anxiety will decrease Outcome: Progressing

## 2024-07-31 NOTE — Progress Notes (Signed)
 Progress Note     Subjective: Patient denies pain. Tolerated soft diet without nausea and vomiting. Having bowel movements and flatulence.   ROS  All negative with the exception of above.  Objective: Vital signs in last 24 hours: Temp:  [97.7 F (36.5 C)-98.5 F (36.9 C)] 98.5 F (36.9 C) (10/09 0521) Pulse Rate:  [60-65] 60 (10/09 0521) Resp:  [15-19] 15 (10/09 0521) BP: (123-133)/(68-80) 123/68 (10/09 0521) SpO2:  [94 %-98 %] 94 % (10/09 0521) Last BM Date : 07/30/24  Intake/Output from previous day: 10/08 0701 - 10/09 0700 In: 2441.7 [P.O.:1320; I.V.:1121.7] Out: -  Intake/Output this shift: No intake/output data recorded.  PE: General: Pleasant male who is laying in bed in NAD. HEENT: Head is normocephalic, atraumatic.   Heart: HR normal. Lungs: Respiratory effort nonlabored. Abd: Soft, ND, NT. No rebound tenderness or guarding. Skin: Warm and dry Psych: A&Ox3 with an appropriate affect.  Lab Results:  Recent Labs    07/29/24 2311 07/30/24 0457  WBC 7.8 6.7  HGB 13.5 13.4  HCT 41.6 40.8  PLT 359 376   BMET Recent Labs    07/29/24 1614 07/29/24 2311 07/30/24 0457  NA 138  --  137  K 3.2*  --  3.4*  CL 100  --  102  CO2 22  --  22  GLUCOSE 103*  --  99  BUN 13  --  14  CREATININE 0.94 0.93 0.97  CALCIUM 9.2  --  8.9   PT/INR No results for input(s): LABPROT, INR in the last 72 hours. CMP     Component Value Date/Time   NA 137 07/30/2024 0457   NA 141 10/24/2016 1030   K 3.4 (L) 07/30/2024 0457   CL 102 07/30/2024 0457   CO2 22 07/30/2024 0457   GLUCOSE 99 07/30/2024 0457   BUN 14 07/30/2024 0457   BUN 20 10/24/2016 1030   CREATININE 0.97 07/30/2024 0457   CALCIUM 8.9 07/30/2024 0457   PROT 7.8 07/29/2024 1614   ALBUMIN 4.2 07/29/2024 1614   AST 41 07/29/2024 1614   ALT 38 07/29/2024 1614   ALKPHOS 102 07/29/2024 1614   BILITOT 0.7 07/29/2024 1614   GFRNONAA >60 07/30/2024 0457   GFRAA >60 01/21/2019 0228   Lipase      Component Value Date/Time   LIPASE 32 07/29/2024 1614       Studies/Results: DG Abd Portable 1V Result Date: 07/30/2024 CLINICAL DATA:  Ileus. EXAM: PORTABLE ABDOMEN - 1 VIEW COMPARISON:  07/29/2024 FINDINGS: Decreased small bowel dilatation is seen in the central abdomen compared to prior study. Increased gas and contrast material now seen throughout the length of the colon. These findings may be seen with improving small bowel obstruction or colonic ileus. Contrast also seen within urinary bladder. Right hip prosthesis also noted. IMPRESSION: Decreased small bowel dilatation, with increased gas and contrast material throughout the colon. These findings may be seen with improving small bowel obstruction or colonic ileus. Electronically Signed   By: Norleen DELENA Kil M.D.   On: 07/30/2024 06:39   CT ABDOMEN PELVIS W CONTRAST Result Date: 07/29/2024 CLINICAL DATA:  Bowel obstruction suspected EXAM: CT ABDOMEN AND PELVIS WITH CONTRAST TECHNIQUE: Multidetector CT imaging of the abdomen and pelvis was performed using the standard protocol following bolus administration of intravenous contrast. RADIATION DOSE REDUCTION: This exam was performed according to the departmental dose-optimization program which includes automated exposure control, adjustment of the mA and/or kV according to patient size and/or use of iterative reconstruction  technique. CONTRAST:  OMNIPAQUE  IOHEXOL  300 MG/ML  SOLN COMPARISON:  X-ray abdomen July 29, 2024. FINDINGS: Lower chest: No acute abnormality. Hepatobiliary: No focal liver abnormality is seen. No gallstones, gallbladder wall thickening, or biliary dilatation. Pancreas: Fat attenuation lesion measuring 1.4 x 1.8 cm in the pancreatic head likely a lipoma. No pancreatic ductal dilatation or surrounding inflammatory changes. Spleen: Normal in size without focal abnormality. Adrenals/Urinary Tract: Right adrenal nodule measuring 1.2 cm with Hounsfield of 48. Kidneys are normal,  without renal calculi, focal lesion, or hydronephrosis. Bladder is partially visualized due to streak artifacts overlying the pelvic images. Stomach/Bowel: Stomach and duodenal are within normal limits. Appendix appears normal. There is dilation of the jejunal loops up to 4.8 cm with a transition point in right lower quadrant (11/63, 9/86), without definite mass lesion, wall thickening or abnormal enhancement. Ileal loops appear unremarkable. Large bowel is not dilated. Colonic diverticulosis without diverticulitis. Vascular/Lymphatic: Atherosclerotic calcifications of aorta. No enlarged abdominal or pelvic lymph nodes. Reproductive: Prostatomegaly. Limited evaluation due to streak artifact from overlying right hip arthroplasty. Other: No abdominal wall hernia or abnormality. No abdominopelvic ascites. Musculoskeletal: No acute or significant osseous findings. Multilevel degenerative changes of the spine and narrowing of L5-S1 disc space. IMPRESSION: Dilated jejunal loops with a transition point in right lower quadrant (jejunoileal) without discrete obstructive lesion. Right adrenal nodule with indeterminate characteristics. Recommend further assessment with adrenal protocol CT with washout. 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 Prostatomegaly.  Correlate with PSA levels. Electronically Signed   By: Megan  Zare M.D.   On: 07/29/2024 19:25   DG Abdomen 1 View Result Date: 07/29/2024 CLINICAL DATA:  Abdominal pain and vomiting. EXAM: ABDOMEN - 1 VIEW COMPARISON:  Radiograph 01/20/2019 FINDINGS: Soft tissue attenuation from habitus limits radiographic assessment. Gaseous distention of bowel loops in the left abdomen measuring up to 7.6 cm. It is unclear if this represents dilated colon or small bowel. Small volume of formed stool in the right colon. Right hip arthroplasty. IMPRESSION: Gaseous distention of bowel loops in the left abdomen measuring up to 7.6 cm. It is unclear if this represents  dilated colon or small bowel. Radiographic assessment is limited due to soft tissue attenuation from habitus. Recommend further assessment with CT. Electronically Signed   By: Andrea Gasman M.D.   On: 07/29/2024 16:27    Anti-infectives: Anti-infectives (From admission, onward)    None        Assessment/Plan Ileus vs SBO -CT from 10/7 showed dilated jejunal loops with a transition point in right lower quadrant (jejunoileal) without discrete obstructive lesion. -DG abd from 10/8 today showed decreased small bowel dilatation, with increased gas and contrast material throughout the colon. These findings may be seen with improving small bowel obstruction or colonic ileus. -Afebrile. -CBC from 10/8 WNL. -Exam without significant findings. No abdominal pain. Denies n/v. Has had BM and flatulence.  -Tolerating soft diet. Can advance as tolerated. -No surgical intervention recommended at this time. Patient is stable for discharge from general surgery standpoint once medically cleared. Please reach out for further questions or concerns.     FEN: Soft and can advance as tolerated; IVF per primary team. VTE: Enoxaparin ID: None currently    LOS: 0 days   I reviewed hospitalist notes, last 24 h vitals and pain scores, last 48 h intake and output, last 24 h labs and trends, and last 24 h imaging results.  This care required moderate level of medical decision making.  Marjorie Carlyon Favre, [facility] 07/31/2024, 8:51 AM Please see Amion for pager number during day hours 7:00am-4:30pm

## 2024-07-31 NOTE — Discharge Summary (Signed)
 Physician Discharge Summary  Ronald Harding FMW:[id number] DOB: [birth year] DOA: 07/29/2024  PCP: Ronald Almarie LABOR, MD  Admit date: 07/29/2024 Discharge date: 07/31/2024    Admitted From: Home Disposition: Home  Recommendations for Outpatient Follow-up:  Follow up with PCP in 1-2 weeks Please obtain BMP/CBC in one week Please follow up with your PCP on the following pending results: Unresulted Labs (From admission, onward)     Start     Ordered   08/05/24 0500  Creatinine, serum  (enoxaparin (LOVENOX)    CrCl >/= 30 ml/min)  Weekly,   R     Comments: while on enoxaparin therapy    07/29/24 2034   07/31/24 9161  Basic metabolic panel  Once,   R       Question:  Specimen collection method  Answer:  Lab=Lab collect   07/31/24 0837              Home Health: None Equipment/Devices: None  Discharge Condition: Stable CODE STATUS: Full code Diet recommendation:  Diet Order             DIET SOFT Room service appropriate? Yes; Fluid consistency: Thin  Diet effective now                   Subjective: Seen and examined, daughter at the bedside.  Patient is sitting at the edge of the bed.  Has no complaints.  He is eager to go home.  Brief/Interim Summary: The patient is a 68 year old male with a past medical history of HTN, morbid obesity, and three prior episodes of ileus with spontaneous resolution who presented to the ER on 07/29/2024 due to abdominal distention, abdominal pain, nausea and vomiting. CT abdomen pelvis with contrast revealed dilated jejunal loops with a transition point in the right lower quadrant without discrete obstructive lesion.  Additionally, an incidental finding of a right adrenal nodule with indeterminate characteristics was noted, in addition to fat attenuation lesion in pancreatic head likely lipoma, and prostatomegaly. He was admitted for possible ileus versus SBO.   #Ileus versus SBO - KUB (10/7) showed gaseous distention of bowel loops in  the left abdomen measuring up to 7.6 cm. - CT AP (10/7) showed dilated jejunal loops with a transition point in the right lower quadrant (jejunoileal) without discrete obstructive lesion. - KUB (10/8) showed decreased small bowel dilatation with increased gas and contrast material throughout the colon, likely consistent with improving small bowel obstruction or colonic ileus. - Patient managed conservatively and has improved significantly, tolerating soft diet and has had several bowel movements.  Seen by general surgery, they have cleared him for discharge.   #Hypokalemia - improving - Resolved.   #Hypertension - Resume home medications.   # Severe morbid obesity - Body mass index is 43.54 kg/m. - Weight loss and diet modification counseled.   #Right adrenal incidentaloma - CTAP showed right adrenal nodule measuring 1.2 cm with 48 Hounsfield units.  - Radiology recommended further assessment with adrenal protocol CT with washout - Recommend follow-up with PCP to complete imaging   # Prostatomegaly - Prostatomegaly noted on CT scan - Recommend follow-up with PCP to correlate with PSA levels  Discharge plan was discussed with patient and/or family member and they verbalized understanding and agreed with it.  Discharge Diagnoses:  Principal Problem:   Ileus [facility])    Discharge Instructions   Allergies as of 07/31/2024       Reactions   Betadine [povidone Iodine] Hives, Other (See Comments)  Pt states in 2012 in [place] he fell and broke several bones. Betadine broke him out.         Medication List     TAKE these medications    albuterol  108 (90 Base) MCG/ACT inhaler Commonly known as: VENTOLIN  HFA Inhale 2 puffs into the lungs every 6 (six) hours as needed for wheezing or shortness of breath.   aspirin  EC 81 MG tablet Take 81 mg by mouth in the morning. Swallow whole.   losartan -hydrochlorothiazide  50-12.5 MG tablet Commonly known as: HYZAAR Take 1 tablet by  mouth daily.        Follow-up Information     Ronald Almarie LABOR, MD Follow up in 1 week(s).   Specialty: Internal Medicine Contact information: [address] [place] [address] [phone]                Allergies  Allergen Reactions   Betadine [Povidone Iodine] Hives and Other (See Comments)    Pt states in 2012 in [place] he fell and broke several bones. Betadine broke him out.     Consultations: General surgery   Procedures/Studies: DG Abd Portable 1V Result Date: 07/30/2024 CLINICAL DATA:  Ileus. EXAM: PORTABLE ABDOMEN - 1 VIEW COMPARISON:  07/29/2024 FINDINGS: Decreased small bowel dilatation is seen in the central abdomen compared to prior study. Increased gas and contrast material now seen throughout the length of the colon. These findings may be seen with improving small bowel obstruction or colonic ileus. Contrast also seen within urinary bladder. Right hip prosthesis also noted. IMPRESSION: Decreased small bowel dilatation, with increased gas and contrast material throughout the colon. These findings may be seen with improving small bowel obstruction or colonic ileus. Electronically Signed   By: Ronald Harding Kil M.D.   On: 07/30/2024 06:39   CT ABDOMEN PELVIS W CONTRAST Result Date: 07/29/2024 CLINICAL DATA:  Bowel obstruction suspected EXAM: CT ABDOMEN AND PELVIS WITH CONTRAST TECHNIQUE: Multidetector CT imaging of the abdomen and pelvis was performed using the standard protocol following bolus administration of intravenous contrast. RADIATION DOSE REDUCTION: This exam was performed according to the departmental dose-optimization program which includes automated exposure control, adjustment of the mA and/or kV according to patient size and/or use of iterative reconstruction technique. CONTRAST:  OMNIPAQUE  IOHEXOL  300 MG/ML  SOLN COMPARISON:  X-ray abdomen July 29, 2024. FINDINGS: Lower chest: No acute abnormality. Hepatobiliary: No focal liver  abnormality is seen. No gallstones, gallbladder wall thickening, or biliary dilatation. Pancreas: Fat attenuation lesion measuring 1.4 x 1.8 cm in the pancreatic head likely a lipoma. No pancreatic ductal dilatation or surrounding inflammatory changes. Spleen: Normal in size without focal abnormality. Adrenals/Urinary Tract: Right adrenal nodule measuring 1.2 cm with Hounsfield of 48. Kidneys are normal, without renal calculi, focal lesion, or hydronephrosis. Bladder is partially visualized due to streak artifacts overlying the pelvic images. Stomach/Bowel: Stomach and duodenal are within normal limits. Appendix appears normal. There is dilation of the jejunal loops up to 4.8 cm with a transition point in right lower quadrant (11/63, 9/86), without definite mass lesion, wall thickening or abnormal enhancement. Ileal loops appear unremarkable. Large bowel is not dilated. Colonic diverticulosis without diverticulitis. Vascular/Lymphatic: Atherosclerotic calcifications of aorta. No enlarged abdominal or pelvic lymph nodes. Reproductive: Prostatomegaly. Limited evaluation due to streak artifact from overlying right hip arthroplasty. Other: No abdominal wall hernia or abnormality. No abdominopelvic ascites. Musculoskeletal: No acute or significant osseous findings. Multilevel degenerative changes of the spine and narrowing of L5-S1 disc space. IMPRESSION: Dilated jejunal loops  with a transition point in right lower quadrant (jejunoileal) without discrete obstructive lesion. Right adrenal nodule with indeterminate characteristics. Recommend further assessment with adrenal protocol CT with washout. 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 Prostatomegaly.  Correlate with PSA levels. Electronically Signed   By: Megan  Zare M.D.   On: 07/29/2024 19:25   DG Abdomen 1 View Result Date: 07/29/2024 CLINICAL DATA:  Abdominal pain and vomiting. EXAM: ABDOMEN - 1 VIEW COMPARISON:  Radiograph 01/20/2019 FINDINGS:  Soft tissue attenuation from habitus limits radiographic assessment. Gaseous distention of bowel loops in the left abdomen measuring up to 7.6 cm. It is unclear if this represents dilated colon or small bowel. Small volume of formed stool in the right colon. Right hip arthroplasty. IMPRESSION: Gaseous distention of bowel loops in the left abdomen measuring up to 7.6 cm. It is unclear if this represents dilated colon or small bowel. Radiographic assessment is limited due to soft tissue attenuation from habitus. Recommend further assessment with CT. Electronically Signed   By: Andrea Gasman M.D.   On: 07/29/2024 16:27     Discharge Exam: Vitals:   07/30/24 2026 07/31/24 0521  BP: 127/68 123/68  Pulse: 65 60  Resp: 19 15  Temp: 98.3 F (36.8 C) 98.5 F (36.9 C)  SpO2: 98% 94%   Vitals:   07/30/24 0934 07/30/24 1225 07/30/24 2026 07/31/24 0521  BP: 133/70 131/80 127/68 123/68  Pulse: 65 64 65 60  Resp: 18 18 19 15   Temp: 97.7 F (36.5 C) 98.3 F (36.8 C) 98.3 F (36.8 C) 98.5 F (36.9 C)  TempSrc:  Oral    SpO2: 98% 98% 98% 94%  Weight:      Height:        General: Pt is alert, awake, not in acute distress Cardiovascular: RRR, S1/S2 +, no rubs, no gallops Respiratory: CTA bilaterally, no wheezing, no rhonchi Abdominal: Soft, NT, ND, bowel sounds + Extremities: no edema, no cyanosis    The results of significant diagnostics from this hospitalization (including imaging, microbiology, ancillary and laboratory) are listed below for reference.     Microbiology: No results found for this or any previous visit (from the past 240 hours).   Labs: BNP (last 3 results) No results for input(s): BNP in the last 8760 hours. Basic Metabolic Panel: Recent Labs  Lab 07/29/24 1614 07/29/24 2311 07/30/24 0457  NA 138  --  137  K 3.2*  --  3.4*  CL 100  --  102  CO2 22  --  22  GLUCOSE 103*  --  99  BUN 13  --  14  CREATININE 0.94 0.93 0.97  CALCIUM 9.2  --  8.9  MG  --    --  2.6*  PHOS  --   --  4.3   Liver Function Tests: Recent Labs  Lab 07/29/24 1614  AST 41  ALT 38  ALKPHOS 102  BILITOT 0.7  PROT 7.8  ALBUMIN 4.2   Recent Labs  Lab 07/29/24 1614  LIPASE 32   No results for input(s): AMMONIA in the last 168 hours. CBC: Recent Labs  Lab 07/29/24 1614 07/29/24 2311 07/30/24 0457  WBC 6.5 7.8 6.7  NEUTROABS 4.5  --   --   HGB 14.3 13.5 13.4  HCT 42.8 41.6 40.8  MCV 87.3 87.2 89.1  PLT 403* 359 376   Cardiac Enzymes: No results for input(s): CKTOTAL, CKMB, CKMBINDEX, TROPONINI in the last 168 hours. BNP: Invalid input(s): POCBNP CBG: No  results for input(s): GLUCAP in the last 168 hours. D-Dimer No results for input(s): DDIMER in the last 72 hours. Hgb A1c No results for input(s): HGBA1C in the last 72 hours. Lipid Profile No results for input(s): CHOL, HDL, LDLCALC, TRIG, CHOLHDL, LDLDIRECT in the last 72 hours. Thyroid function studies No results for input(s): TSH, T4TOTAL, T3FREE, THYROIDAB in the last 72 hours.  Invalid input(s): FREET3 Anemia work up No results for input(s): VITAMINB12, FOLATE, FERRITIN, TIBC, IRON, RETICCTPCT in the last 72 hours. Urinalysis    Component Value Date/Time   COLORURINE YELLOW 07/29/2024 1615   APPEARANCEUR CLEAR 07/29/2024 1615   LABSPEC 1.020 07/29/2024 1615   PHURINE 5.0 07/29/2024 1615   GLUCOSEU NEGATIVE 07/29/2024 1615   GLUCOSEU NEGATIVE 10/26/2023 0958   HGBUR NEGATIVE 07/29/2024 1615   BILIRUBINUR NEGATIVE 07/29/2024 1615   KETONESUR NEGATIVE 07/29/2024 1615   PROTEINUR NEGATIVE 07/29/2024 1615   UROBILINOGEN 0.2 10/26/2023 0958   NITRITE NEGATIVE 07/29/2024 1615   LEUKOCYTESUR NEGATIVE 07/29/2024 1615   Sepsis Labs Recent Labs  Lab 07/29/24 1614 07/29/24 2311 07/30/24 0457  WBC 6.5 7.8 6.7   Microbiology No results found for this or any previous visit (from the past 240 hours).  FURTHER DISCHARGE  INSTRUCTIONS:   Get Medicines reviewed and adjusted: Please take all your medications with you for your next visit with your Primary MD   Laboratory/radiological data: Please request your Primary MD to go over all hospital tests and procedure/radiological results at the follow up, please ask your Primary MD to get all Hospital records sent to his/her office.   In some cases, they will be blood work, cultures and biopsy results pending at the time of your discharge. Please request that your primary care M.D. goes through all the records of your hospital data and follows up on these results.   Also Note the following: If you experience worsening of your admission symptoms, develop shortness of breath, life threatening emergency, suicidal or homicidal thoughts you must seek medical attention immediately by calling 911 or calling your MD immediately  if symptoms less severe.   You must read complete instructions/literature along with all the possible adverse reactions/side effects for all the Medicines you take and that have been prescribed to you. Take any new Medicines after you have completely understood and accpet all the possible adverse reactions/side effects.    patient was instructed, not to drive, operate heavy machinery, perform activities at heights, swimming or participation in water  activities or provide baby-sitting services while on Pain, Sleep and Anxiety Medications; until their outpatient Physician has advised to do so again. Also recommended to not to take more than prescribed Pain, Sleep and Anxiety Medications.  It is not advisable to combine anxiety, sleep and pain medications without talking with your primary care provider.     Wear Seat belts while driving.   Please note: You were cared for by a hospitalist during your hospital stay. Once you are discharged, your primary care physician will handle any further medical issues. Please note that NO REFILLS for any discharge  medications will be authorized once you are discharged, as it is imperative that you return to your primary care physician (or establish a relationship with a primary care physician if you do not have one) for your post hospital discharge needs so that they can reassess your need for medications and monitor your lab values  Time coordinating discharge: Over 30 minutes  SIGNED:   Fredia Skeeter, MD  [facility] 07/31/2024, 9:30  AM *Please note that this is a verbal dictation therefore any spelling or grammatical errors are due to the Dragon [employer] One system interpretation. If 7PM-7AM, please contact night-coverage [email or web address]

## 2024-07-31 NOTE — Progress Notes (Signed)
 Discharge instructions given to patient questions asked and answered.

## 2024-08-20 DIAGNOSIS — M65342 Trigger finger, left ring finger: Secondary | ICD-10-CM | POA: Diagnosis not present

## 2024-08-20 DIAGNOSIS — D171 Benign lipomatous neoplasm of skin and subcutaneous tissue of trunk: Secondary | ICD-10-CM | POA: Diagnosis not present

## 2024-08-25 ENCOUNTER — Ambulatory Visit: Admitting: Emergency Medicine

## 2024-08-25 ENCOUNTER — Encounter: Payer: Self-pay | Admitting: Emergency Medicine

## 2024-08-25 VITALS — BP 130/80 | HR 81 | Temp 98.2°F | Ht 72.0 in | Wt 325.0 lb

## 2024-08-25 DIAGNOSIS — M7989 Other specified soft tissue disorders: Secondary | ICD-10-CM | POA: Diagnosis not present

## 2024-08-25 NOTE — Assessment & Plan Note (Addendum)
 Clinically stable.  No red flag signs or symptoms. Differential diagnosis discussed with patient. Most likely lipoma Recommend soft tissue ultrasound and follow-up after that May need referral to general surgery

## 2024-08-25 NOTE — Progress Notes (Signed)
 Ronald Harding 68 y.o.   Chief Complaint  Patient presents with   Cyst    Pt states that he would like a MRI done on the knot on the right side of his back     HISTORY OF PRESENT ILLNESS: This is a 68 y.o. male complaining of large cyst to right flank area for months No other complaints or medical concerns today.   HPI   Prior to Admission medications   Medication Sig Start Date End Date Taking? Authorizing Provider  albuterol  (VENTOLIN  HFA) 108 (90 Base) MCG/ACT inhaler Inhale 2 puffs into the lungs every 6 (six) hours as needed for wheezing or shortness of breath. 10/26/23   Rollene Almarie LABOR, MD  aspirin  EC 81 MG tablet Take 81 mg by mouth in the morning. Swallow whole.    [provider]  losartan -hydrochlorothiazide  (HYZAAR) 50-12.5 MG tablet Take 1 tablet by mouth daily. 10/26/23   Rollene Almarie LABOR, MD    Allergies  Allergen Reactions   Betadine [Povidone Iodine] Hives and Other (See Comments)    Pt states in 2012 in [place] he fell and broke several bones. Betadine broke him out.     Patient Active Problem List   Diagnosis Date Noted   History of colonic polyps 09/29/2022   Second degree hemorrhoids 09/29/2022   Primary osteoarthritis of right hip 12/15/2021   Degeneration of lumbar intervertebral disc 12/15/2021   Nuclear sclerotic cataract of both eyes 11/21/2021   Aortic atherosclerosis 09/10/2020   Prediabetes 09/10/2020   Idiopathic medial aortopathy and arteriopathy (HCC) 08/18/2020   Thoracic ascending aortic aneurysm 08/20/2015   Essential hypertension 06/30/2015   Morbid obesity (HCC) 06/30/2015    Past Medical History:  Diagnosis Date   Aortic aneurysm 2019   4.6 cm ascending thoracic aortic aneurysm   Arthritis    Asthma    hx of-as a child   History of colon polyps    benign   Hypertension    takes Hyzaar daily   Joint pain    Pneumonia    hx of-in the 70's   Pre-diabetes    SBO (small bowel obstruction) (HCC)    with  NG tube placement    Past Surgical History:  Procedure Laterality Date   ANKLE SURGERY Right 10/23/2010   COLONOSCOPY     MULTIPLE EXTRACTIONS WITH ALVEOLOPLASTY N/A 09/23/2015   Procedure: Extraction of tooth #'s 1-12,14- 28, 30-32 with alveoloplasty, bilateral mandibular tori reductions and reduction of lateral exostoses.;  Surgeon: Tanda JULIANNA Fanny, DDS;  Location: [facility];  Service: Oral Surgery;  Laterality: N/A;   NOSE SURGERY  10/24/2011   POLYPECTOMY     TOTAL HIP ARTHROPLASTY Right 03/01/2023   Procedure: TOTAL HIP ARTHROPLASTY ANTERIOR APPROACH;  Surgeon: Fidel Rogue, MD;  Location: [facility] ORS;  Service: Orthopedics;  Laterality: Right;  150    Social History   Socioeconomic History   Marital status: Married    Spouse name: Choona   Number of children: 2   Years of education: Not on file   Highest education level: Not on file  Occupational History   Occupation: Midwife  Tobacco Use   Smoking status: Never   Smokeless tobacco: Former    Types: Chew   Tobacco comments:    one pack of chew per week  Vaping Use   Vaping status: Never Used  Substance and Sexual Activity   Alcohol  use: No    Alcohol /week: 0.0 standard drinks of alcohol    Drug use: No  Sexual activity: Not on file  Other Topics Concern   Not on file  Social History Narrative   Married. 2 children. Disabled. Lives with wife and one daughter   Social Drivers of Health   Financial Resource Strain: Low Risk  (10/08/2023)   Overall Financial Resource Strain (CARDIA)    Difficulty of Paying Living Expenses: Not hard at all  Food Insecurity: No Food Insecurity (07/29/2024)   Hunger Vital Sign    Worried About Running Out of Food in the Last Year: Never true    Ran Out of Food in the Last Year: Never true  Transportation Needs: No Transportation Needs (07/29/2024)   PRAPARE - Administrator, Civil Service (Medical): No    Lack of Transportation (Non-Medical): No  Physical Activity:  Inactive (10/08/2023)   Exercise Vital Sign    Days of Exercise per Week: 0 days    Minutes of Exercise per Session: 0 min  Stress: No Stress Concern Present (10/08/2023)   [employer] of Occupational Health - Occupational Stress Questionnaire    Feeling of Stress : Not at all  Social Connections: Socially Integrated (07/29/2024)   Social Connection and Isolation Panel    Frequency of Communication with Friends and Family: More than three times a week    Frequency of Social Gatherings with Friends and Family: More than three times a week    Attends Religious Services: More than 4 times per year    Active Member of [employer] or Organizations: Yes    Attends Banker Meetings: 1 to 4 times per year    Marital Status: Married  Catering Manager Violence: Not At Risk (07/29/2024)   Humiliation, Afraid, Rape, and Kick questionnaire    Fear of Current or Ex-Partner: No    Emotionally Abused: No    Physically Abused: No    Sexually Abused: No    Family History  Problem Relation Age of Onset   Hypertension Mother    Arthritis Mother    Hypertension Father    Heart disease Father    Arthritis Father    Rectal cancer Other    Stomach cancer Other    Esophageal cancer Other    Colon cancer Other    Colon polyps Other      Review of Systems  Constitutional: Negative.  Negative for chills and fever.  HENT: Negative.  Negative for congestion and sore throat.   Respiratory: Negative.  Negative for cough and shortness of breath.   Cardiovascular: Negative.  Negative for chest pain and palpitations.  Gastrointestinal:  Negative for abdominal pain, diarrhea, nausea and vomiting.  Genitourinary: Negative.  Negative for dysuria and hematuria.  Skin: Negative.  Negative for rash.  Neurological:  Negative for dizziness and headaches.  All other systems reviewed and are negative.   Today's Vitals   08/25/24 1302  BP: 130/80  Pulse: 81  Temp: 98.2 F (36.8 C)  TempSrc: Oral   Weight: (!) 325 lb (147.4 kg)  Height: 6' (1.829 m)   Body mass index is 44.08 kg/m.   Physical Exam Vitals reviewed.  Constitutional:      Appearance: Normal appearance. He is obese.  Eyes:     Extraocular Movements: Extraocular movements intact.  Cardiovascular:     Rate and Rhythm: Normal rate.  Pulmonary:     Effort: Pulmonary effort is normal.  Skin:    General: Skin is warm and dry.     Comments: Large sebaceous cyst right flank area  Neurological:     Mental Status: He is alert and oriented to person, place, and time.  Psychiatric:        Mood and Affect: Mood normal.        Behavior: Behavior normal.      ASSESSMENT & PLAN: I personally spent a total of 30 minutes minutes in the care of the patient today including preparing to see the patient, getting/reviewing separately obtained history, performing a medically appropriate exam/evaluation, counseling and educating, placing orders, documenting clinical information in the EHR, coordinating care, and prognosis, need for soft tissue ultrasound and follow-up after getting it done.  Problem List Items Addressed This Visit       Other   Soft tissue mass - Primary   Clinically stable.  No red flag signs or symptoms. Differential diagnosis discussed with patient. Most likely lipoma Recommend soft tissue ultrasound and follow-up after that May need referral to general surgery        Relevant Orders   US  Soft Tissue Head/Neck (NON-THYROID)   Patient Instructions  Lipoma  A lipoma is a noncancerous (benign) tumor that is made up of fat cells. This is a very common type of soft-tissue growth. Lipomas are usually found under the skin (subcutaneous). They may occur in any tissue of the body that contains fat. Common areas for lipomas to appear include the back, arms, shoulders, buttocks, and thighs. Lipomas grow slowly, and they are usually painless. Most lipomas do not cause problems and do not require  treatment. What are the causes? The cause of this condition is not known. What increases the risk? You are more likely to develop this condition if: You are 85-10 years old. You have a family history of lipomas. What are the signs or symptoms? A lipoma usually appears as a small, round bump under the skin. In most cases, the lump will: Feel soft or rubbery. Not cause pain or other symptoms. However, if a lipoma is located in an area where it pushes on nerves, it can become painful or cause other symptoms. How is this diagnosed? A lipoma can usually be diagnosed with a physical exam. You may also have tests to confirm the diagnosis and to rule out other conditions. Tests may include: Imaging tests, such as a CT scan or an MRI. Removal of a tissue sample to be looked at under a microscope (biopsy). How is this treated? Treatment for this condition depends on the size of the lipoma and whether it is causing any symptoms. For small lipomas that are not causing problems, no treatment is needed. If a lipoma is bigger or it causes problems, surgery may be done to remove the lipoma. Lipomas can also be removed to improve appearance. Most often, the procedure is done after applying a medicine that numbs the area (local anesthetic). Liposuction may be done to reduce the size of the lipoma before it is removed through surgery, or it may be done to remove the lipoma. Lipomas are removed with this method to limit incision size and scarring. A liposuction tube is inserted through a small incision into the lipoma, and the contents of the lipoma are removed through the tube with suction. Follow these instructions at home: Watch your lipoma for any changes. Keep all follow-up visits. This is important. Where to find more information OrthoInfo: orthoinfo.aaos.org Contact a health care provider if: Your lipoma becomes larger or hard. Your lipoma becomes painful, red, or increasingly swollen. These could be  signs of infection or a  more serious condition. Get help right away if: You develop tingling or numbness in an area near the lipoma. This could indicate that the lipoma is causing nerve damage. Summary A lipoma is a noncancerous tumor that is made up of fat cells. Most lipomas do not cause problems and do not require treatment. If a lipoma is bigger or it causes problems, surgery may be done to remove the lipoma. Contact a health care provider if your lipoma becomes larger or hard, or if it becomes painful, red, or increasingly swollen. These could be signs of infection or a more serious condition. This information is not intended to replace advice given to you by your health care provider. Make sure you discuss any questions you have with your health care provider. Document Revised: 10/28/2021 Document Reviewed: 10/28/2021 Elsevier Patient Education  2024 Elsevier Inc.   Emil Schaumann, MD  Primary Care at [facility]

## 2024-08-25 NOTE — Patient Instructions (Signed)
 Lipoma  A lipoma is a noncancerous (benign) tumor that is made up of fat cells. This is a very common type of soft-tissue growth. Lipomas are usually found under the skin (subcutaneous). They may occur in any tissue of the body that contains fat. Common areas for lipomas to appear include the back, arms, shoulders, buttocks, and thighs. Lipomas grow slowly, and they are usually painless. Most lipomas do not cause problems and do not require treatment. What are the causes? The cause of this condition is not known. What increases the risk? You are more likely to develop this condition if: You are 18-42 years old. You have a family history of lipomas. What are the signs or symptoms? A lipoma usually appears as a small, round bump under the skin. In most cases, the lump will: Feel soft or rubbery. Not cause pain or other symptoms. However, if a lipoma is located in an area where it pushes on nerves, it can become painful or cause other symptoms. How is this diagnosed? A lipoma can usually be diagnosed with a physical exam. You may also have tests to confirm the diagnosis and to rule out other conditions. Tests may include: Imaging tests, such as a CT scan or an MRI. Removal of a tissue sample to be looked at under a microscope (biopsy). How is this treated? Treatment for this condition depends on the size of the lipoma and whether it is causing any symptoms. For small lipomas that are not causing problems, no treatment is needed. If a lipoma is bigger or it causes problems, surgery may be done to remove the lipoma. Lipomas can also be removed to improve appearance. Most often, the procedure is done after applying a medicine that numbs the area (local anesthetic). Liposuction may be done to reduce the size of the lipoma before it is removed through surgery, or it may be done to remove the lipoma. Lipomas are removed with this method to limit incision size and scarring. A liposuction tube is  inserted through a small incision into the lipoma, and the contents of the lipoma are removed through the tube with suction. Follow these instructions at home: Watch your lipoma for any changes. Keep all follow-up visits. This is important. Where to find more information OrthoInfo: orthoinfo.aaos.org Contact a health care provider if: Your lipoma becomes larger or hard. Your lipoma becomes painful, red, or increasingly swollen. These could be signs of infection or a more serious condition. Get help right away if: You develop tingling or numbness in an area near the lipoma. This could indicate that the lipoma is causing nerve damage. Summary A lipoma is a noncancerous tumor that is made up of fat cells. Most lipomas do not cause problems and do not require treatment. If a lipoma is bigger or it causes problems, surgery may be done to remove the lipoma. Contact a health care provider if your lipoma becomes larger or hard, or if it becomes painful, red, or increasingly swollen. These could be signs of infection or a more serious condition. This information is not intended to replace advice given to you by your health care provider. Make sure you discuss any questions you have with your health care provider. Document Revised: 10/28/2021 Document Reviewed: 10/28/2021 Elsevier Patient Education  2024 [employer].

## 2024-08-26 ENCOUNTER — Ambulatory Visit: Admitting: Emergency Medicine

## 2024-08-26 DIAGNOSIS — H2513 Age-related nuclear cataract, bilateral: Secondary | ICD-10-CM | POA: Diagnosis not present

## 2024-08-26 DIAGNOSIS — H02836 Dermatochalasis of left eye, unspecified eyelid: Secondary | ICD-10-CM | POA: Diagnosis not present

## 2024-08-26 DIAGNOSIS — H34831 Tributary (branch) retinal vein occlusion, right eye, with macular edema: Secondary | ICD-10-CM | POA: Diagnosis not present

## 2024-08-26 DIAGNOSIS — H02833 Dermatochalasis of right eye, unspecified eyelid: Secondary | ICD-10-CM | POA: Diagnosis not present

## 2024-08-27 ENCOUNTER — Ambulatory Visit: Admitting: Emergency Medicine

## 2024-09-02 ENCOUNTER — Ambulatory Visit
Admission: RE | Admit: 2024-09-02 | Discharge: 2024-09-02 | Disposition: A | Discharge status: Home / Self Care | Source: Ambulatory Visit | Attending: Emergency Medicine | Admitting: Emergency Medicine

## 2024-09-02 DIAGNOSIS — M7989 Other specified soft tissue disorders: Secondary | ICD-10-CM

## 2024-09-02 DIAGNOSIS — D175 Benign lipomatous neoplasm of intra-abdominal organs: Secondary | ICD-10-CM | POA: Diagnosis not present

## 2024-09-04 ENCOUNTER — Ambulatory Visit: Payer: Self-pay | Admitting: Emergency Medicine

## 2024-10-08 ENCOUNTER — Other Ambulatory Visit: Payer: Self-pay | Admitting: Internal Medicine

## 2024-10-08 DIAGNOSIS — H34831 Tributary (branch) retinal vein occlusion, right eye, with macular edema: Secondary | ICD-10-CM | POA: Diagnosis not present

## 2024-10-08 DIAGNOSIS — I7121 Aneurysm of the ascending aorta, without rupture: Secondary | ICD-10-CM

## 2024-10-08 DIAGNOSIS — H02833 Dermatochalasis of right eye, unspecified eyelid: Secondary | ICD-10-CM | POA: Diagnosis not present

## 2024-10-08 DIAGNOSIS — H2513 Age-related nuclear cataract, bilateral: Secondary | ICD-10-CM | POA: Diagnosis not present

## 2024-10-08 DIAGNOSIS — H02836 Dermatochalasis of left eye, unspecified eyelid: Secondary | ICD-10-CM | POA: Diagnosis not present

## 2024-10-08 LAB — OPHTHALMOLOGY REPORT-SCANNED

## 2024-10-27 ENCOUNTER — Encounter: Payer: Medicare HMO | Admitting: Internal Medicine
